# Patient Record
Sex: Male | Born: 2000 | Race: Black or African American | Hispanic: No | Marital: Single | Smoking: Current some day smoker
Health system: Southern US, Community
[De-identification: ages and names within clinical notes are randomized; demographics above are authoritative.]

## PROBLEM LIST (undated history)

## (undated) DIAGNOSIS — E119 Type 2 diabetes mellitus without complications: Secondary | ICD-10-CM

## (undated) DIAGNOSIS — F909 Attention-deficit hyperactivity disorder, unspecified type: Secondary | ICD-10-CM

## (undated) HISTORY — PX: TONSILLECTOMY: SUR1361

## (undated) HISTORY — DX: Type 2 diabetes mellitus without complications: E11.9

## (undated) HISTORY — PX: APPENDECTOMY: SHX54

---
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
## 2018-01-23 ENCOUNTER — Encounter (HOSPITAL_BASED_OUTPATIENT_CLINIC_OR_DEPARTMENT_OTHER): Payer: Self-pay | Admitting: *Deleted

## 2018-01-23 ENCOUNTER — Other Ambulatory Visit: Payer: Self-pay

## 2018-01-23 ENCOUNTER — Emergency Department (HOSPITAL_BASED_OUTPATIENT_CLINIC_OR_DEPARTMENT_OTHER): Payer: Medicaid Other

## 2018-01-23 ENCOUNTER — Inpatient Hospital Stay (HOSPITAL_BASED_OUTPATIENT_CLINIC_OR_DEPARTMENT_OTHER)
Admission: EM | Admit: 2018-01-23 | Discharge: 2018-01-25 | DRG: 638 | Disposition: A | Discharge status: Home / Self Care | Payer: Medicaid Other | Attending: Internal Medicine | Admitting: Internal Medicine

## 2018-01-23 DIAGNOSIS — J101 Influenza due to other identified influenza virus with other respiratory manifestations: Secondary | ICD-10-CM | POA: Diagnosis not present

## 2018-01-23 DIAGNOSIS — F1110 Opioid abuse, uncomplicated: Secondary | ICD-10-CM | POA: Diagnosis present

## 2018-01-23 DIAGNOSIS — R945 Abnormal results of liver function studies: Secondary | ICD-10-CM | POA: Diagnosis present

## 2018-01-23 DIAGNOSIS — E119 Type 2 diabetes mellitus without complications: Secondary | ICD-10-CM | POA: Diagnosis not present

## 2018-01-23 DIAGNOSIS — E1110 Type 2 diabetes mellitus with ketoacidosis without coma: Secondary | ICD-10-CM

## 2018-01-23 DIAGNOSIS — E860 Dehydration: Secondary | ICD-10-CM | POA: Diagnosis not present

## 2018-01-23 DIAGNOSIS — R079 Chest pain, unspecified: Secondary | ICD-10-CM | POA: Diagnosis present

## 2018-01-23 DIAGNOSIS — E1010 Type 1 diabetes mellitus with ketoacidosis without coma: Secondary | ICD-10-CM | POA: Diagnosis not present

## 2018-01-23 DIAGNOSIS — R0789 Other chest pain: Secondary | ICD-10-CM | POA: Diagnosis not present

## 2018-01-23 DIAGNOSIS — E861 Hypovolemia: Secondary | ICD-10-CM | POA: Diagnosis not present

## 2018-01-23 DIAGNOSIS — R05 Cough: Secondary | ICD-10-CM

## 2018-01-23 DIAGNOSIS — R109 Unspecified abdominal pain: Secondary | ICD-10-CM | POA: Diagnosis present

## 2018-01-23 DIAGNOSIS — N179 Acute kidney failure, unspecified: Secondary | ICD-10-CM | POA: Diagnosis not present

## 2018-01-23 DIAGNOSIS — R0781 Pleurodynia: Secondary | ICD-10-CM | POA: Diagnosis not present

## 2018-01-23 DIAGNOSIS — B349 Viral infection, unspecified: Secondary | ICD-10-CM

## 2018-01-23 DIAGNOSIS — R7989 Other specified abnormal findings of blood chemistry: Secondary | ICD-10-CM | POA: Diagnosis present

## 2018-01-23 DIAGNOSIS — T383X6A Underdosing of insulin and oral hypoglycemic [antidiabetic] drugs, initial encounter: Secondary | ICD-10-CM | POA: Diagnosis present

## 2018-01-23 DIAGNOSIS — E871 Hypo-osmolality and hyponatremia: Secondary | ICD-10-CM | POA: Diagnosis present

## 2018-01-23 LAB — I-STAT VENOUS BLOOD GAS, ED
Acid-base deficit: 11 mmol/L — ABNORMAL HIGH (ref 0.0–2.0)
Bicarbonate: 14.5 mmol/L — ABNORMAL LOW (ref 20.0–28.0)
O2 SAT: 88 %
Patient temperature: 98.4
TCO2: 15 mmol/L — ABNORMAL LOW (ref 22–32)
pCO2, Ven: 30 mmHg — ABNORMAL LOW (ref 44.0–60.0)
pH, Ven: 7.292 (ref 7.250–7.430)
pO2, Ven: 59 mmHg — ABNORMAL HIGH (ref 32.0–45.0)

## 2018-01-23 LAB — GLUCOSE, CAPILLARY
Glucose-Capillary: 263 mg/dL — ABNORMAL HIGH (ref 70–99)
Glucose-Capillary: 226 mg/dL — ABNORMAL HIGH (ref 70–99)
Glucose-Capillary: 197 mg/dL — ABNORMAL HIGH (ref 70–99)

## 2018-01-23 LAB — BASIC METABOLIC PANEL
Anion gap: 13 (ref 5–15)
BUN: 26 mg/dL — AB (ref 6–20)
CALCIUM: 8 mg/dL — AB (ref 8.9–10.3)
CO2: 15 mmol/L — ABNORMAL LOW (ref 22–32)
CREATININE: 1.27 mg/dL — AB (ref 0.61–1.24)
Chloride: 104 mmol/L (ref 98–111)
GFR calc Af Amer: >60 mL/min (ref >60)
GFR calc non Af Amer: >60 mL/min (ref >60)
Glucose, Bld: 236 mg/dL — ABNORMAL HIGH (ref 70–99)
Potassium: 4.2 mmol/L (ref 3.5–5.1)
Sodium: 132 mmol/L — ABNORMAL LOW (ref 135–145)

## 2018-01-23 LAB — CBG MONITORING, ED
GLUCOSE-CAPILLARY: 346 mg/dL — AB (ref 70–99)
Glucose-Capillary: 338 mg/dL — ABNORMAL HIGH (ref 70–99)
Glucose-Capillary: 321 mg/dL — ABNORMAL HIGH (ref 70–99)
Glucose-Capillary: 323 mg/dL — ABNORMAL HIGH (ref 70–99)

## 2018-01-23 LAB — COMPREHENSIVE METABOLIC PANEL
ALBUMIN: 4.1 g/dL (ref 3.5–5.0)
ALK PHOS: 72 U/L (ref 38–126)
ALT: 23 U/L (ref 0–44)
AST: 48 U/L — ABNORMAL HIGH (ref 15–41)
BILIRUBIN TOTAL: 2.2 mg/dL — AB (ref 0.3–1.2)
BUN: 23 mg/dL — ABNORMAL HIGH (ref 6–20)
CO2: 16 mmol/L — ABNORMAL LOW (ref 22–32)
Calcium: 8.9 mg/dL (ref 8.9–10.3)
Chloride: 92 mmol/L — ABNORMAL LOW (ref 98–111)
Creatinine, Ser: 1.41 mg/dL — ABNORMAL HIGH (ref 0.61–1.24)
GLUCOSE: 352 mg/dL — AB (ref 70–99)
POTASSIUM: 4.5 mmol/L (ref 3.5–5.1)
Sodium: 129 mmol/L — ABNORMAL LOW (ref 135–145)
TOTAL PROTEIN: 7.5 g/dL (ref 6.5–8.1)

## 2018-01-23 LAB — CBC
HEMATOCRIT: 47.2 % (ref 39.0–52.0)
Hemoglobin: 15.5 g/dL (ref 13.0–17.0)
MCH: 28 pg (ref 26.0–34.0)
MCHC: 32.8 g/dL (ref 30.0–36.0)
MCV: 85.4 fL (ref 80.0–100.0)
Platelets: 296 10*3/uL (ref 150–400)
RBC: 5.53 MIL/uL (ref 4.22–5.81)
RDW: 12 % (ref 11.5–15.5)
WBC: 7.6 10*3/uL (ref 4.0–10.5)
nRBC: 0 % (ref 0.0–0.2)

## 2018-01-23 LAB — URINALYSIS, ROUTINE W REFLEX MICROSCOPIC
Bilirubin Urine: NEGATIVE
Glucose, UA: >=500 mg/dL — AB
Hgb urine dipstick: NEGATIVE
Ketones, ur: >80 mg/dL — AB
Leukocytes, UA: NEGATIVE
Nitrite: NEGATIVE
Protein, ur: NEGATIVE mg/dL
Specific Gravity, Urine: 1.015 (ref 1.005–1.030)
pH: 6 (ref 5.0–8.0)

## 2018-01-23 LAB — INFLUENZA PANEL BY PCR (TYPE A & B)
Influenza A By PCR: POSITIVE — AB
Influenza B By PCR: NEGATIVE

## 2018-01-23 LAB — TROPONIN I: Troponin I: <0.03 ng/mL (ref <0.03)

## 2018-01-23 LAB — URINALYSIS, MICROSCOPIC (REFLEX)

## 2018-01-23 LAB — MRSA PCR SCREENING: MRSA by PCR: NEGATIVE

## 2018-01-23 LAB — LIPASE, BLOOD: Lipase: 16 U/L (ref 11–51)

## 2018-01-23 MED ORDER — ONDANSETRON HCL 4 MG/2ML IJ SOLN
4.0000 mg | Freq: Once | INTRAMUSCULAR | Status: AC
  Administered 2018-01-23: 4 mg via INTRAVENOUS
  Filled 2018-01-23: qty 2

## 2018-01-23 MED ORDER — NITROGLYCERIN 0.4 MG SL SUBL: 0.4000 mg | SUBLINGUAL_TABLET | SUBLINGUAL | Status: DC | PRN

## 2018-01-23 MED ORDER — DM-GUAIFENESIN ER 30-600 MG PO TB12: 1.0000 | ORAL_TABLET | Freq: Two times a day (BID) | ORAL | Status: DC | PRN

## 2018-01-23 MED ORDER — KETOROLAC TROMETHAMINE 15 MG/ML IJ SOLN
15.0000 mg | Freq: Once | INTRAMUSCULAR | Status: AC
  Administered 2018-01-23: 15 mg via INTRAVENOUS
  Filled 2018-01-23: qty 1

## 2018-01-23 MED ORDER — SODIUM CHLORIDE 0.9 % IV BOLUS
1000.0000 mL | Freq: Once | INTRAVENOUS | Status: AC
  Administered 2018-01-23: 1000 mL via INTRAVENOUS

## 2018-01-23 MED ORDER — ACETAMINOPHEN 325 MG PO TABS: 325.0000 mg | ORAL_TABLET | Freq: Four times a day (QID) | ORAL | Status: DC | PRN

## 2018-01-23 MED ORDER — INSULIN REGULAR(HUMAN) IN NACL 100-0.9 UT/100ML-% IV SOLN
INTRAVENOUS | Status: DC
  Administered 2018-01-23: 2.8 [IU]/h via INTRAVENOUS
  Filled 2018-01-23: qty 100

## 2018-01-23 MED ORDER — ONDANSETRON HCL 4 MG/2ML IJ SOLN
4.0000 mg | Freq: Once | INTRAMUSCULAR | Status: AC
  Administered 2018-01-23: 4 mg via INTRAVENOUS

## 2018-01-23 MED ORDER — SODIUM CHLORIDE 0.9 % IV BOLUS
2000.0000 mL | Freq: Once | INTRAVENOUS | Status: AC
  Administered 2018-01-23: 2000 mL via INTRAVENOUS

## 2018-01-23 MED ORDER — SODIUM CHLORIDE 0.9 % IV SOLN
INTRAVENOUS | Status: DC
  Administered 2018-01-23: 17:00:00 via INTRAVENOUS

## 2018-01-23 MED ORDER — ONDANSETRON HCL 4 MG/2ML IJ SOLN
INTRAMUSCULAR | Status: AC
  Filled 2018-01-23: qty 2

## 2018-01-23 MED ORDER — ZOLPIDEM TARTRATE 5 MG PO TABS: 5.0000 mg | ORAL_TABLET | Freq: Every evening | ORAL | Status: DC | PRN

## 2018-01-23 MED ORDER — INSULIN REGULAR(HUMAN) IN NACL 100-0.9 UT/100ML-% IV SOLN
INTRAVENOUS | Status: DC
  Filled 2018-01-23: qty 100

## 2018-01-23 MED ORDER — POTASSIUM CHLORIDE 10 MEQ/100ML IV SOLN
10.0000 meq | INTRAVENOUS | Status: AC
  Administered 2018-01-23 (×2): 10 meq via INTRAVENOUS
  Filled 2018-01-23 (×2): qty 100

## 2018-01-23 MED ORDER — ENOXAPARIN SODIUM 40 MG/0.4ML ~~LOC~~ SOLN
40.0000 mg | SUBCUTANEOUS | Status: DC
  Administered 2018-01-23 – 2018-01-24 (×2): 40 mg via SUBCUTANEOUS
  Filled 2018-01-23 (×2): qty 0.4

## 2018-01-23 MED ORDER — DEXTROSE-NACL 5-0.45 % IV SOLN
INTRAVENOUS | Status: DC
  Administered 2018-01-23: 22:00:00 via INTRAVENOUS

## 2018-01-23 MED ORDER — ALBUTEROL SULFATE (2.5 MG/3ML) 0.083% IN NEBU: 2.5000 mg | INHALATION_SOLUTION | RESPIRATORY_TRACT | Status: DC | PRN

## 2018-01-23 MED ORDER — SODIUM CHLORIDE 0.9 % IV SOLN
INTRAVENOUS | Status: AC
  Filled 2018-01-23: qty 1

## 2018-01-23 MED ORDER — SODIUM CHLORIDE 0.9 % IV SOLN
INTRAVENOUS | Status: DC
  Administered 2018-01-23: 21:00:00 via INTRAVENOUS

## 2018-01-23 MED ORDER — MORPHINE SULFATE (PF) 2 MG/ML IV SOLN
2.0000 mg | INTRAVENOUS | Status: DC | PRN
  Administered 2018-01-23: 2 mg via INTRAVENOUS
  Filled 2018-01-23: qty 1

## 2018-01-23 MED ORDER — ONDANSETRON HCL 4 MG/2ML IJ SOLN: 4.0000 mg | Freq: Three times a day (TID) | INTRAMUSCULAR | Status: DC | PRN

## 2018-01-23 NOTE — ED Notes (Signed)
Pt. Reports he has been sick with flu like symptoms for a week and now a very severe cough that has made his chest sore.  Pt. Reports feeling tired and weak.  Pt. In no distress.  Pt. Is diabetic.  Pt. Has insulin pens for his use.

## 2018-01-23 NOTE — Significant Event (Signed)
17 year old with past medical history relevant for type 1 diabetes who came in with subjective fevers, chills, cough to [facility].  Vitals were unremarkable.  Labs are notable for AKI, anion gap, acidosis consistent with very mild DKA.  Additionally patient was noted to have mildly elevated bilirubin and AST.  Patient was given IV fluids and started on IV insulin.  Patient will be admitted to stepdown bed for very mild DKA.  Blood cultures and chest x-ray pending.

## 2018-01-23 NOTE — ED Notes (Signed)
Pt unable to void at this time. 

## 2018-01-23 NOTE — ED Notes (Signed)
Pt. Said he did not take his INSULIN today due to he did not eat today due to nausea.  Pt. Logan SpanielSaid he has had some nausea and vomiting.

## 2018-01-23 NOTE — ED Provider Notes (Signed)
[facility] Emergency Department Provider Note MRN:  [id number]  Arrival date & time: 01/23/18     Chief Complaint   Fever History of Present Illness   Logan Washington is a 17 y.o. year-old male with a history of type 1 diabetes presenting to the ED with chief complaint of fever.  For 1 to 2 days, patient has been experiencing dry cough, general malaise, runny nose, mild frontal headache.  Last night he was unable to sleep because he was feeling feverish and overheated.  Feels dehydrated today.  Denies neck pain, no shortness of breath, no chest pain.  Endorsing mild diffuse abdominal cramping pain.  Denies dysuria, no numbness weakness to the arms or legs.  Symptoms are constant, no exacerbating or alleviating factors.  Review of Systems  A complete 10 system review of systems was obtained and all systems are negative except as noted in the HPI and PMH.   Patient's Health History    Past Medical History:  Diagnosis Date  . Diabetes mellitus without complication (HCC)     History reviewed. No pertinent surgical history.  Family History  Problem Relation Age of Onset  . Sudden death Neg Hx   . Heart attack Neg Hx     Social History   Socioeconomic History  . Marital status: Single    Spouse name: Not on file  . Number of children: Not on file  . Years of education: Not on file  . Highest education level: Not on file  Occupational History  . Not on file  Social Needs  . Financial resource strain: Not on file  . Food insecurity:    Worry: Not on file    Inability: Not on file  . Transportation needs:    Medical: Not on file    Non-medical: Not on file  Tobacco Use  . Smoking status: Never Smoker  . Smokeless tobacco: Never Used  Substance and Sexual Activity  . Alcohol use: Not on file  . Drug use: Not on file  . Sexual activity: Not on file  Lifestyle  . Physical activity:    Days per week: Not on file    Minutes per session: Not on  file  . Stress: Not on file  Relationships  . Social connections:    Talks on phone: Not on file    Gets together: Not on file    Attends religious service: Not on file    Active member of club or organization: Not on file    Attends meetings of clubs or organizations: Not on file    Relationship status: Not on file  . Intimate partner violence:    Fear of current or ex partner: Not on file    Emotionally abused: Not on file    Physically abused: Not on file    Forced sexual activity: Not on file  Other Topics Concern  . Not on file  Social History Narrative  . Not on file     Physical Exam  Vital Signs and Nursing Notes reviewed Vitals:   01/23/18 1502  BP: 139/70  Pulse: 92  Resp: 16  Temp: 98.3 F (36.8 C)  SpO2: 100%    CONSTITUTIONAL: Well-appearing, NAD NEURO:  Alert and oriented x 3, no focal deficits EYES:  eyes equal and reactive ENT/NECK:  no LAD, no JVD CARDIO: Regular rate, well-perfused, normal S1 and S2 PULM:  CTAB no wheezing or rhonchi GI/GU:  normal bowel sounds, non-distended, non-tender MSK/SPINE:  No  gross deformities, no edema SKIN:  no rash, atraumatic PSYCH:  Appropriate speech and behavior  Diagnostic and Interventional Summary    EKG Interpretation  Date/Time:    Ventricular Rate:    PR Interval:    QRS Duration:   QT Interval:    QTC Calculation:   R Axis:     Text Interpretation:        Labs Reviewed  COMPREHENSIVE METABOLIC PANEL - Abnormal; Notable for the following components:      Result Value   Sodium 129 (*)    Chloride 92 (*)    CO2 16 (*)    Glucose, Bld 352 (*)    BUN 23 (*)    Creatinine, Ser 1.41 (*)    AST 48 (*)    Total Bilirubin 2.2 (*)    Anion gap >20 (*)    All other components within normal limits  CBG MONITORING, ED - Abnormal; Notable for the following components:   Glucose-Capillary 346 (*)    All other components within normal limits  CBG MONITORING, ED - Abnormal; Notable for the following  components:   Glucose-Capillary 338 (*)    All other components within normal limits  I-STAT VENOUS BLOOD GAS, ED - Abnormal; Notable for the following components:   pCO2, Ven 30.0 (*)    pO2, Ven 59.0 (*)    Bicarbonate 14.5 (*)    TCO2 15 (*)    Acid-base deficit 11.0 (*)    All other components within normal limits  CULTURE, BLOOD (ROUTINE X 2)  CULTURE, BLOOD (ROUTINE X 2)  CBC  URINALYSIS, ROUTINE W REFLEX MICROSCOPIC  BLOOD GAS, VENOUS  CBG MONITORING, ED    DG Chest 2 View    (Results Pending)    Medications  insulin regular, human (MYXREDLIN) 100 units/ 100 mL infusion (2.8 Units/hr Intravenous New Bag/Given 01/23/18 1708)  0.9 %  sodium chloride infusion ( Intravenous New Bag/Given 01/23/18 1652)  potassium chloride 10 mEq in 100 mL IVPB (10 mEq Intravenous New Bag/Given 01/23/18 1716)  sodium chloride 0.9 % with insulin regular (NOVOLIN R,HUMULIN R) ADS Med (has no administration in time range)  sodium chloride 0.9 % bolus 1,000 mL (0 mLs Intravenous Stopped 01/23/18 1638)  ketorolac (TORADOL) 15 MG/ML injection 15 mg (15 mg Intravenous Given 01/23/18 1539)  ondansetron (ZOFRAN) injection 4 mg (4 mg Intravenous Given 01/23/18 1545)     Procedures Critical Care Critical Care Documentation Critical care time provided by me (excluding procedures): 37 minutes  Condition necessitating critical care: Diabetic ketoacidosis  Components of critical care management: reviewing of prior records, laboratory and imaging interpretation, frequent re-examination and reassessment of vital signs, administration of IV fluid resuscitation, IV insulin, IV potassium, discussion with consulting services.    ED Course and Medical Decision Making  I have reviewed the triage vital signs and the nursing notes.  Pertinent labs & imaging results that were available during my care of the patient were reviewed by me and considered in my medical decision making (see below for  details).  Favoring viral illness in this 17 year old male, multiple sick contacts at home.  Abdomen is nontender and reassuring.  Given his type 1 diabetes history, will obtain labs to exclude possibility of DKA.  Labs reveal base deficit with anion gap metabolic acidosis consistent with DKA.  Provided with fluid bolus, normal saline infusion, potassium supplementation, IV insulin drip, accepted for admission by the hospitalist service.  Elmer Sow. Pilar Plate, MD [facility] Emergency Medicine [facility]  Health [email or web address]  Final Clinical Impressions(s) / ED Diagnoses     ICD-10-CM   1. Viral illness B34.9   2. DKA (diabetic ketoacidoses) (HCC) E11.10 DG Chest 2 View    DG Chest 2 View    ED Discharge Orders    None         Sabas Sous, MD 01/23/18 [phone]

## 2018-01-23 NOTE — H&P (Signed)
History and Physical    Logan Washington ZOX:[id number] DOB: 27-Mar-2000 DOA: 01/23/2018  Referring MD/NP/PA:   PCP: Patient, No Pcp Per   Patient coming from:  The patient is coming from home.  At baseline, pt is independent for most of ADL.        Chief Complaint: Cough, runny nose, sore throat, subjective fever, chills, abdominal pain, chest pain  HPI: Logan Washington is a 17 y.o. male with medical history significant of type 1 diabetes, who presents with cough, runny nose, sore throat, subjective fever, chills, abdominal pain, chest pain.  Patient states that he has been sick for more than 1 week.  He has cough, runny nose, sore throat, subjective fever and chills. He is taking over-the-counter medications without significant help.  She denies shortness of breath.  Today he developed chest pain, which is located in the front chest, intermittent, 7 out of 10 in severity, nonradiating, dull, not pruritic.  Denies tenderness and cough virus.  She has nausea, no vomiting or diarrhea.  He also has abdominal pain in the past 2 days, which is located in central abdomen, sharp, 9 out of 10 in severity, nonradiating.  Denies symptoms of UTI or unilateral weakness. He states that he did not take his insulin in the past 2 days because of feeling too tired.  ED Course: pt was found to have DKA (blood sugar 352, bicarbonate of 16, positive ketone in urine, anion gap 20), WBC 7.6, positive flu A pcr, negative Rapid strep, urinalysis negative for UTI, abnormal liver function (ALP 72, AST 48, ALT 23, total bilirubin 2.2), temperature 99, heart rate in 90s, no tachypnea, oxygen saturation 100% on room air, negative chest x-ray.  Patient is accepted to [facility] as inpt by accepting MD.  Review of Systems:   General: has fevers, chills, runny nose, sore throat, no body weight gain, has poor appetite, has fatigue HEENT: no blurry vision, hearing changes or sore throat Respiratory: no dyspnea, has coughing, no  wheezing CV: has chest pain, no palpitations GI: has nausea,  abdominal pain, no diarrhea, constipation, vomiting, GU: no dysuria, burning on urination, increased urinary frequency, hematuria  Ext: no leg edema Neuro: no unilateral weakness, numbness, or tingling, no vision change or hearing loss Skin: no rash, no skin tear. MSK: No muscle spasm, no deformity, no limitation of range of movement in spin Heme: No easy bruising.  Travel history: No recent long distant travel.  Allergy:  Allergies  Allergen Reactions  . Shellfish Allergy Swelling    Past Medical History:  Diagnosis Date  . Diabetes mellitus without complication (HCC)     History reviewed. No pertinent surgical history.  Social History:  reports that he has never smoked. He has never used smokeless tobacco. No history on file for alcohol and drug.  Family History:  Family History  Problem Relation Age of Onset  . Sudden death Neg Hx   . Heart attack Neg Hx      Prior to Admission medications   Medication Sig Start Date End Date Taking? Authorizing Provider  B-D UF III MINI PEN NEEDLES 31G X 5 MM MISC FOR USE WITH INSULIN PENS 10/12/15  Yes [provider]  glucagon, human recombinant, (GLUCAGEN DIAGNOSTIC) 1 MG injection Inject 1 mL into the muscle once as needed for low blood sugar. 08/30/15  Yes [provider]  LANTUS SOLOSTAR 100 UNIT/ML Solostar Pen Inject 31 Units into the skin at bedtime.  10/09/15  Yes [provider]  Rubbie Battiest  FLEXPEN 100 UNIT/ML FlexPen INJECT UNDER SKIN WITH MEALS. USE UP TO 50 UNITS DAILY. 10/09/15  Yes [provider]    Physical Exam: Vitals:   01/24/18 0200 01/24/18 0300 01/24/18 0356 01/24/18 0400  BP: 128/67 126/65  126/75  Pulse: (!) 101 93  85  Resp: 16 16  (!) 9  Temp:   98.9 F (37.2 C)   TempSrc:   Oral   SpO2: 96% 97%  98%  Weight:      Height:       General: Not in acute distress.  Dry mucosal membrane HEENT:       Eyes: PERRL,  EOMI, no scleral icterus.       ENT: No discharge from the ears and nose, has pharynx injection, no tonsillar enlargement.        Neck: No JVD, no bruit, no mass felt. Heme: No neck lymph node enlargement. Cardiac: S1/S2, RRR, No murmurs, No gallops or rubs. Respiratory: No rales, wheezing, rhonchi or rubs. GI: Soft, nondistended, has tenderness in cental abdomen, no rebound pain, no organomegaly, BS present. GU: No hematuria Ext: No pitting leg edema bilaterally. 2+DP/PT pulse bilaterally. Musculoskeletal: No joint deformities, No joint redness or warmth, no limitation of ROM in spin. Skin: No rashes.  Neuro: Alert, oriented X3, cranial nerves II-XII grossly intact, moves all extremities normally.  Psych: Patient is not psychotic, no suicidal or hemocidal ideation.  Labs on Admission: I have personally reviewed following labs and imaging studies  CBC: Recent Labs  Lab 01/23/18 1519  WBC 7.6  HGB 15.5  HCT 47.2  MCV 85.4  PLT 296   Basic Metabolic Panel: Recent Labs  Lab 01/23/18 1519 01/23/18 2142 01/24/18 0210  NA 129* 132* 132*  K 4.5 4.2 4.0  CL 92* 104 106  CO2 16* 15* 18*  GLUCOSE 352* 236* 157*  BUN 23* 26* 22*  CREATININE 1.41* 1.27* 1.04  CALCIUM 8.9 8.0* 7.8*   GFR: Estimated Creatinine Clearance: 109.9 mL/min (by C-G formula based on SCr of 1.04 mg/dL). Liver Function Tests: Recent Labs  Lab 01/23/18 1519  AST 48*  ALT 23  ALKPHOS 72  BILITOT 2.2*  PROT 7.5  ALBUMIN 4.1   Recent Labs  Lab 01/23/18 2242  LIPASE 16   No results for input(s): AMMONIA in the last 168 hours. Coagulation Profile: No results for input(s): INR, PROTIME in the last 168 hours. Cardiac Enzymes: Recent Labs  Lab 01/23/18 2242  TROPONINI <0.03   BNP (last 3 results) No results for input(s): PROBNP in the last 8760 hours. HbA1C: No results for input(s): HGBA1C in the last 72 hours. CBG: Recent Labs  Lab 01/24/18 0003 01/24/18 0111 01/24/18 0212 01/24/18 0324  01/24/18 0510  GLUCAP 167* 159* 140* 120* 86   Lipid Profile: No results for input(s): CHOL, HDL, LDLCALC, TRIG, CHOLHDL, LDLDIRECT in the last 72 hours. Thyroid Function Tests: No results for input(s): TSH, T4TOTAL, FREET4, T3FREE, THYROIDAB in the last 72 hours. Anemia Panel: No results for input(s): VITAMINB12, FOLATE, FERRITIN, TIBC, IRON, RETICCTPCT in the last 72 hours. Urine analysis:    Component Value Date/Time   COLORURINE YELLOW 01/23/2018 1519   APPEARANCEUR CLEAR 01/23/2018 1519   LABSPEC 1.015 01/23/2018 1519   PHURINE 6.0 01/23/2018 1519   GLUCOSEU >=500 (A) 01/23/2018 1519   HGBUR NEGATIVE 01/23/2018 1519   BILIRUBINUR NEGATIVE 01/23/2018 1519   KETONESUR >80 (A) 01/23/2018 1519   PROTEINUR NEGATIVE 01/23/2018 1519   NITRITE NEGATIVE 01/23/2018 1519   LEUKOCYTESUR  NEGATIVE 01/23/2018 1519   Sepsis Labs: @LABRCNTIP (procalcitonin:4,lacticidven:4) ) Recent Results (from the past 240 hour(s))  MRSA PCR Screening     Status: None   Collection Time: 01/23/18  8:23 PM  Result Value Ref Range Status   MRSA by PCR NEGATIVE NEGATIVE Final    Comment:        The GeneXpert MRSA Assay (FDA approved for NASAL specimens only), is one component of a comprehensive MRSA colonization surveillance program. It is not intended to diagnose MRSA infection nor to guide or monitor treatment for MRSA infections. Performed at [facility], [address], [place], [place] [address]   Group A Strep by PCR     Status: None   Collection Time: 01/24/18  1:53 AM  Result Value Ref Range Status   Group A Strep by PCR NOT DETECTED NOT DETECTED Final    Comment: Performed at [facility], [address] [place], [place] [address]     Radiological Exams on Admission: Dg Chest 2 View  Result Date: 01/23/2018 CLINICAL DATA:  DKA, fever EXAM: CHEST - 2 VIEW COMPARISON:  None. FINDINGS: The heart size and mediastinal contours are within normal  limits. Both lungs are clear. The visualized skeletal structures are unremarkable. IMPRESSION: No active cardiopulmonary disease. Electronically Signed   By: Jasmine Pang M.D.   On: 01/23/2018 18:17     EKG:  Not done in ED, will get one.   Assessment/Plan Principal Problem:   DKA, type 1 (HCC) Active Problems:   Diabetes mellitus without complication (HCC)   AKI (acute kidney injury) (HCC)   Abnormal LFTs   Chest pain   Influenza A   Abdominal pain   DKA, type 1 (HCC): blood sugar 352, bicarbonate of 16, positive ketone in urine, anion gap 20). This is due to noncompliance to taking insulin.  - Admit to stepdown (pt was initially accepted to SDU as inpt by accepting MD) - 3L of NS bolus - start DKA protocol with BMP q4h - IVF: NS 125 cc/h; will switch to D5-1/2NS when CBG<250 - replete K as needed - Zofran prn nausea  - NPO  - consult to diabetic educator and case manager  Diabetes mellitus without complication (HCC): Last A1c not on record. Patient is taking novolog and lantus at home -on DKA protocol -check A1c  AKI: Likely due to dehydration and continuation - IVF as above - Follow up renal function by BMP - Avoid using renal toxic medications  Influenza A:  -Tamiflu started  Chest pain: Etiology is not clear, possibly due to demand ischemia secondary to flu and a DKA. - cycle CE q6 x3 and repeat EKG in the am  - prn Nitroglycerin, Morphine, and aspirin - Risk factor stratification: will check FLP and A1C, UDS  Abdominal pain: Etiology is not clear, may be partially related to DKA.  Patient has nausea, no vomiting or diarrhea.  No acute abdomen on physical examination. -Check lipase -prn morphine for pain and Zofran for nausea  Abnormal LFTs:  -check hepatitis panel, HIV antibody - Judicious use of Tylenol (325 mg every 6 hours).  Patient has AKI, cannot use NSAIDs   Inpatient status:  # Patient requires inpatient status due to high intensity of service,  high risk for further deterioration and high frequency of surveillance required.  I certify that at the point of admission it is my clinical judgment that the patient will require inpatient hospital care spanning beyond 2 midnights from the point  of admission.  . This patient has hx of DM-type I  . Now patient has presenting symptoms include DKA, acute renal injury, chest pain, abdominal pain, flu symptoms . The worrisome physical exam findings include abdominal tenderness, . The initial radiographic and laboratory data are worrisome because of acute renal injury, positive flu a PCR, abnormal liver function . Current medical needs: please see my assessment and plan . Predictability of an adverse outcome (risk): Patient has a type 1 diabetes, which is likely poorly controlled. Now presents with DKA, chest pain, abdominal pain, flu A influenza.  Due to multiple acute issues, patient is at high risk of deteriorating, will need to stay in hospital for more than 2 days.    DVT ppx: SQ Lovenox Code Status: Full code Family Communication: None at bed side.    Disposition Plan:  Anticipate discharge back to previous home environment Consults called:  none Admission status:   SDU/inpation       Date of Service 01/24/2018    Lorretta HarpXilin Sewell Pitner [facility] Pager [phone]  If 7PM-7AM, please contact night-coverage [email or web address] Password [email or web address] 01/24/2018, 5:31 AM

## 2018-01-23 NOTE — ED Triage Notes (Signed)
Cough, runny nose. He has been using OTC cold medication.

## 2018-01-24 DIAGNOSIS — R0781 Pleurodynia: Secondary | ICD-10-CM

## 2018-01-24 DIAGNOSIS — J101 Influenza due to other identified influenza virus with other respiratory manifestations: Secondary | ICD-10-CM | POA: Diagnosis present

## 2018-01-24 DIAGNOSIS — E1010 Type 1 diabetes mellitus with ketoacidosis without coma: Principal | ICD-10-CM

## 2018-01-24 DIAGNOSIS — R109 Unspecified abdominal pain: Secondary | ICD-10-CM | POA: Diagnosis present

## 2018-01-24 DIAGNOSIS — N179 Acute kidney failure, unspecified: Secondary | ICD-10-CM

## 2018-01-24 LAB — BASIC METABOLIC PANEL
Anion gap: 8 (ref 5–15)
Anion gap: 10 (ref 5–15)
BUN: 22 mg/dL — ABNORMAL HIGH (ref 6–20)
BUN: 19 mg/dL (ref 6–20)
CO2: 18 mmol/L — ABNORMAL LOW (ref 22–32)
CO2: 19 mmol/L — AB (ref 22–32)
CREATININE: 1.04 mg/dL (ref 0.61–1.24)
Calcium: 7.8 mg/dL — ABNORMAL LOW (ref 8.9–10.3)
Calcium: 8.2 mg/dL — ABNORMAL LOW (ref 8.9–10.3)
Chloride: 106 mmol/L (ref 98–111)
Chloride: 106 mmol/L (ref 98–111)
Creatinine, Ser: 1.08 mg/dL (ref 0.61–1.24)
GFR calc Af Amer: >60 mL/min (ref >60)
GFR calc Af Amer: >60 mL/min (ref >60)
GFR calc non Af Amer: >60 mL/min (ref >60)
GFR calc non Af Amer: >60 mL/min (ref >60)
Glucose, Bld: 157 mg/dL — ABNORMAL HIGH (ref 70–99)
Glucose, Bld: 90 mg/dL (ref 70–99)
Potassium: 4 mmol/L (ref 3.5–5.1)
Potassium: 4.1 mmol/L (ref 3.5–5.1)
Sodium: 132 mmol/L — ABNORMAL LOW (ref 135–145)
Sodium: 135 mmol/L (ref 135–145)

## 2018-01-24 LAB — GLUCOSE, CAPILLARY
Glucose-Capillary: 120 mg/dL — ABNORMAL HIGH (ref 70–99)
Glucose-Capillary: 139 mg/dL — ABNORMAL HIGH (ref 70–99)
Glucose-Capillary: 140 mg/dL — ABNORMAL HIGH (ref 70–99)
Glucose-Capillary: 159 mg/dL — ABNORMAL HIGH (ref 70–99)
Glucose-Capillary: 167 mg/dL — ABNORMAL HIGH (ref 70–99)
Glucose-Capillary: 207 mg/dL — ABNORMAL HIGH (ref 70–99)
Glucose-Capillary: 216 mg/dL — ABNORMAL HIGH (ref 70–99)
Glucose-Capillary: 78 mg/dL (ref 70–99)
Glucose-Capillary: 86 mg/dL (ref 70–99)
Glucose-Capillary: 90 mg/dL (ref 70–99)
Glucose-Capillary: 98 mg/dL (ref 70–99)

## 2018-01-24 LAB — COMPREHENSIVE METABOLIC PANEL
ALT: 20 U/L (ref 0–44)
AST: 27 U/L (ref 15–41)
Albumin: 3.7 g/dL (ref 3.5–5.0)
Alkaline Phosphatase: 58 U/L (ref 38–126)
Anion gap: 15 (ref 5–15)
BUN: 16 mg/dL (ref 6–20)
CO2: 18 mmol/L — ABNORMAL LOW (ref 22–32)
Calcium: 8.6 mg/dL — ABNORMAL LOW (ref 8.9–10.3)
Chloride: 101 mmol/L (ref 98–111)
Creatinine, Ser: 1.02 mg/dL (ref 0.61–1.24)
GFR calc non Af Amer: >60 mL/min (ref >60)
Glucose, Bld: 169 mg/dL — ABNORMAL HIGH (ref 70–99)
Potassium: 4.3 mmol/L (ref 3.5–5.1)
SODIUM: 134 mmol/L — AB (ref 135–145)
Total Bilirubin: 1.5 mg/dL — ABNORMAL HIGH (ref 0.3–1.2)
Total Protein: 6.8 g/dL (ref 6.5–8.1)

## 2018-01-24 LAB — RAPID URINE DRUG SCREEN, HOSP PERFORMED
Amphetamines: NOT DETECTED
Barbiturates: NOT DETECTED
Benzodiazepines: NOT DETECTED
Cocaine: NOT DETECTED
Opiates: POSITIVE — AB
Tetrahydrocannabinol: POSITIVE — AB

## 2018-01-24 LAB — HEMOGLOBIN A1C
Hgb A1c MFr Bld: 8.9 % — ABNORMAL HIGH (ref 4.8–5.6)
MEAN PLASMA GLUCOSE: 208.73 mg/dL

## 2018-01-24 LAB — LIPID PANEL
Cholesterol: 94 mg/dL (ref 0–200)
HDL: 28 mg/dL — ABNORMAL LOW (ref >40)
LDL Cholesterol: 55 mg/dL (ref 0–99)
TRIGLYCERIDES: 57 mg/dL (ref <150)
Total CHOL/HDL Ratio: 3.4 RATIO
VLDL: 11 mg/dL (ref 0–40)

## 2018-01-24 LAB — TROPONIN I
Troponin I: <0.03 ng/mL (ref <0.03)
Troponin I: <0.03 ng/mL (ref <0.03)

## 2018-01-24 LAB — GROUP A STREP BY PCR: Group A Strep by PCR: NOT DETECTED

## 2018-01-24 LAB — HIV ANTIBODY (ROUTINE TESTING W REFLEX): HIV Screen 4th Generation wRfx: NONREACTIVE

## 2018-01-24 MED ORDER — PHENOL 1.4 % MT LIQD
1.0000 | OROMUCOSAL | Status: DC | PRN
  Administered 2018-01-24: 1 via OROMUCOSAL
  Filled 2018-01-24: qty 177

## 2018-01-24 MED ORDER — INSULIN GLARGINE 100 UNIT/ML ~~LOC~~ SOLN
15.0000 [IU] | Freq: Every day | SUBCUTANEOUS | Status: DC
  Administered 2018-01-24 – 2018-01-25 (×2): 15 [IU] via SUBCUTANEOUS
  Filled 2018-01-24 (×2): qty 0.15

## 2018-01-24 MED ORDER — ASPIRIN 325 MG PO TABS
325.0000 mg | ORAL_TABLET | Freq: Every day | ORAL | Status: DC
  Administered 2018-01-24: 325 mg via ORAL
  Filled 2018-01-24: qty 1

## 2018-01-24 MED ORDER — BENZONATATE 100 MG PO CAPS
200.0000 mg | ORAL_CAPSULE | Freq: Three times a day (TID) | ORAL | Status: DC | PRN
  Administered 2018-01-24 – 2018-01-25 (×2): 200 mg via ORAL
  Filled 2018-01-24 (×2): qty 2

## 2018-01-24 MED ORDER — INSULIN ASPART 100 UNIT/ML ~~LOC~~ SOLN: 0.0000 [IU] | SUBCUTANEOUS | Status: DC

## 2018-01-24 MED ORDER — INSULIN ASPART 100 UNIT/ML ~~LOC~~ SOLN
0.0000 [IU] | Freq: Every day | SUBCUTANEOUS | Status: DC
  Administered 2018-01-24: 2 [IU] via SUBCUTANEOUS

## 2018-01-24 MED ORDER — OSELTAMIVIR PHOSPHATE 75 MG PO CAPS
75.0000 mg | ORAL_CAPSULE | Freq: Two times a day (BID) | ORAL | Status: DC
  Administered 2018-01-24 – 2018-01-25 (×4): 75 mg via ORAL
  Filled 2018-01-24 (×4): qty 1

## 2018-01-24 MED ORDER — GUAIFENESIN-DM 100-10 MG/5ML PO SYRP
5.0000 mL | ORAL_SOLUTION | ORAL | Status: DC | PRN
  Administered 2018-01-24: 5 mL via ORAL
  Filled 2018-01-24: qty 10

## 2018-01-24 MED ORDER — INSULIN ASPART 100 UNIT/ML ~~LOC~~ SOLN
0.0000 [IU] | Freq: Three times a day (TID) | SUBCUTANEOUS | Status: DC
  Administered 2018-01-24: 2 [IU] via SUBCUTANEOUS
  Administered 2018-01-24: 5 [IU] via SUBCUTANEOUS
  Administered 2018-01-25: 8 [IU] via SUBCUTANEOUS

## 2018-01-24 NOTE — Progress Notes (Addendum)
[facility] PROGRESS NOTE  Jerrik Housholder NWG:[id number] DOB: Dec 26, 2000 DOA: 01/23/2018  PCP: Patient, No Pcp Per  Brief History/Interval Summary: 17 y.o. male with medical history significant of type 1 diabetes, who presented with cough, runny nose, sore throat, subjective fever, chills, abdominal pain, chest pain.  Patient was found to be in diabetic ketoacidosis.  He also tested positive for influenza A.  Patient was hospitalized for further management.  He mentioned to the admitting provider that he had not taken his insulin in 2 days but he told me that he was taking his insulin.  Reason for Visit: Diabetic ketoacidosis.  Influenza.  Consultants: None  Procedures: None  Antibiotics: Tamiflu  Subjective/Interval History: Patient continues to feel poorly.  Continues to have cough with discomfort in his chest while coughing.  Some shortness of breath.  Denies any nausea or vomiting.  No diarrhea.  Still feels fatigued.  ROS: Denies any headaches  Objective:  Vital Signs  Vitals:   01/24/18 0356 01/24/18 0400 01/24/18 0500 01/24/18 0735  BP:  126/75 125/65   Pulse:  85 88   Resp:  (!) 9 18   Temp: 98.9 F (37.2 C)   98.4 F (36.9 C)  TempSrc: Oral   Oral  SpO2:  98% 97%   Weight:      Height:        Intake/Output Summary (Last 24 hours) at 01/24/2018 1039 Last data filed at 01/24/2018 0510 Gross per 24 hour  Intake 1097.65 ml  Output 2450 ml  Net -1352.35 ml   Filed Weights   01/23/18 1456  Weight: 68 kg    General appearance: alert, cooperative, appears stated age and no distress Head: Normocephalic, without obvious abnormality, atraumatic Resp: Few scattered wheezes.  No rhonchi.  Normal effort at rest. Cardio: regular rate and rhythm, S1, S2 normal, no murmur, click, rub or gallop GI: soft, non-tender; bowel sounds normal; no masses,  no organomegaly Extremities: extremities normal, atraumatic, no cyanosis or edema Pulses: 2+ and  symmetric Neurologic: No focal neurological deficits noted.  Lab Results:  Data Reviewed: I have personally reviewed following labs and imaging studies  CBC: Recent Labs  Lab 01/23/18 1519  WBC 7.6  HGB 15.5  HCT 47.2  MCV 85.4  PLT 296    Basic Metabolic Panel: Recent Labs  Lab 01/23/18 1519 01/23/18 2142 01/24/18 0210  NA 129* 132* 132*  K 4.5 4.2 4.0  CL 92* 104 106  CO2 16* 15* 18*  GLUCOSE 352* 236* 157*  BUN 23* 26* 22*  CREATININE 1.41* 1.27* 1.04  CALCIUM 8.9 8.0* 7.8*    GFR: Estimated Creatinine Clearance: 109.9 mL/min (by C-G formula based on SCr of 1.04 mg/dL).  Liver Function Tests: Recent Labs  Lab 01/23/18 1519  AST 48*  ALT 23  ALKPHOS 72  BILITOT 2.2*  PROT 7.5  ALBUMIN 4.1    Recent Labs  Lab 01/23/18 2242  LIPASE 16    Cardiac Enzymes: Recent Labs  Lab 01/23/18 2242 01/24/18 0527  TROPONINI <0.03 <0.03    HbA1C: Recent Labs    01/24/18 0527  HGBA1C 8.9*    CBG: Recent Labs  Lab 01/24/18 0324 01/24/18 0510 01/24/18 0627 01/24/18 0645 01/24/18 0759  GLUCAP 120* 86 90 78 98    Lipid Profile: Recent Labs    01/24/18 0527  CHOL 94  HDL 28*  LDLCALC 55  TRIG 57  CHOLHDL 3.4     Recent Results (from the past 240 hour(s))  MRSA  PCR Screening     Status: None   Collection Time: 01/23/18  8:23 PM  Result Value Ref Range Status   MRSA by PCR NEGATIVE NEGATIVE Final    Comment:        The GeneXpert MRSA Assay (FDA approved for NASAL specimens only), is one component of a comprehensive MRSA colonization surveillance program. It is not intended to diagnose MRSA infection nor to guide or monitor treatment for MRSA infections. Performed at [facility], [address] [place], [place] [address]   Group A Strep by PCR     Status: None   Collection Time: 01/24/18  1:53 AM  Result Value Ref Range Status   Group A Strep by PCR NOT DETECTED NOT DETECTED Final    Comment: Performed at  [facility], [address] [place], [place] [address]      Radiology Studies: Dg Chest 2 View  Result Date: 01/23/2018 CLINICAL DATA:  DKA, fever EXAM: CHEST - 2 VIEW COMPARISON:  None. FINDINGS: The heart size and mediastinal contours are within normal limits. Both lungs are clear. The visualized skeletal structures are unremarkable. IMPRESSION: No active cardiopulmonary disease. Electronically Signed   By: Jasmine PangKim  Fujinaga M.D.   On: 01/23/2018 18:17     Medications:  Scheduled: . aspirin  325 mg Oral Daily  . enoxaparin (LOVENOX) injection  40 mg Subcutaneous Q24H  . insulin aspart  0-15 Units Subcutaneous TID WC  . insulin aspart  0-5 Units Subcutaneous QHS  . insulin glargine  15 Units Subcutaneous Daily  . oseltamivir  75 mg Oral BID   Continuous:  BMW:[id number]:acetaminophen, albuterol, dextromethorphan-guaiFENesin, morphine injection, nitroGLYCERIN, ondansetron (ZOFRAN) IV, zolpidem    Assessment/Plan:  Diabetic ketoacidosis in the setting of type 1 diabetes Patient had elevated anion gap.  DKA was likely brought on by influenza.  Patient may not have taken his Lantus insulin the last 2 days prior to admission.  That could have also contributed.  Patient was initially placed on IV insulin.  Anion gap closed.  He was transitioned to Lantus.  Continue to monitor CBGs.  HbA1c is 8.9.  Acute influenza Still feels poorly.  Coughing a lot.  Some shortness of breath.  Continue with Tamiflu.  Cough medications as needed.  Droplet precautions.  Acute kidney injury/hyponatremia Most likely due to dehydration and hypovolemia.  Improved with IV hydration.  Back to baseline now.  Monitor urine output.  Sodium levels have improved.  Continue to monitor.  Chest pain and abdominal pain The symptoms are likely due to influenza as well as DKA.  EKG nonischemic.  Chest pain is primarily with coughing.  Do not anticipate any further work-up.  Patient reassured.  Lipase was normal.   LDL 55.  Discontinue aspirin.  Mildly abnormal LFTs His AST was noted to be mildly elevated.  We will recheck.  Abnormal urine drug screen Urine drug screen positive for opioids as well as THC.  DVT Prophylaxis: Lovenox    Code Status: Full code Family Communication: Discussed with the patient.  No other family member at bedside Disposition Plan: Management as outlined above.  Await further improvement in symptoms.  Okay for transfer to floor later today.    LOS: 1 day   Osvaldo ShipperGokul Shlok Raz  [facility] Pager [phone] 01/24/2018, 10:39 AM  If 7PM-7AM, please contact night-coverage at [email or web address], password [email or web address]

## 2018-01-24 NOTE — Progress Notes (Signed)
Patient arrived on unit via wheelchair from ICU. No family at bedside.  

## 2018-01-24 NOTE — Progress Notes (Signed)
Inpatient Diabetes Program Recommendations  AACE/ADA: New Consensus Statement on Inpatient Glycemic Control (2015)  Target Ranges:  Prepandial:   less than 140 mg/dL      Peak postprandial:   less than 180 mg/dL (1-2 hours)      Critically ill patients:  140 - 180 mg/dL   Lab Results  Component Value Date   GLUCAP 98 01/24/2018   HGBA1C 8.9 (H) 01/24/2018    Review of Glycemic Control Results for Logan Washington, Logan Washington (MRN [id number]) as of 01/24/2018 09:37  Ref. Range 01/24/2018 05:10 01/24/2018 06:27 01/24/2018 06:45 01/24/2018 07:59  Glucose-Capillary Latest Ref Range: 70 - 99 mg/dL 86 90 78 98   Diabetes history: Type 1 diabetes since age 89 Outpatient Diabetes medications: Lantus 31 units q HS,  Novolog 1 units for every 50 mg/dL> 846150 mg/dL, 1 unit for every 7 grams of CHO   Current orders for Inpatient glycemic control:  Lantus 15 units daily, Novolog sensitive q 4 hours Inpatient Diabetes Program Recommendations:    Referral received.  Patient saw Endocrinologist on 01/19/18.  He has had Type 1 Dm since age 89.  +Flu has likely contributed to DKA.  Discussed with RN by phone.  No needs at this time.   MD, please add Novolog 6 units tid with meals.  Also, based on home dose of Lantus, patient likely needs increase of Lantus to 25 units daily.  Please consider adding Lantus 10 units x 1 and start Lantus 25 units daily on 01/25/18.  Thanks,  Beryl MeagerJenny Merit Maybee, RN, BC-ADM Inpatient Diabetes Coordinator Pager [phone] (8a-5p)

## 2018-01-25 LAB — GLUCOSE, CAPILLARY: Glucose-Capillary: 254 mg/dL — ABNORMAL HIGH (ref 70–99)

## 2018-01-25 LAB — BASIC METABOLIC PANEL
Anion gap: 16 — ABNORMAL HIGH (ref 5–15)
BUN: 13 mg/dL (ref 6–20)
CO2: 21 mmol/L — ABNORMAL LOW (ref 22–32)
Calcium: 9 mg/dL (ref 8.9–10.3)
Chloride: 95 mmol/L — ABNORMAL LOW (ref 98–111)
Creatinine, Ser: 1.22 mg/dL (ref 0.61–1.24)
GFR calc Af Amer: >60 mL/min (ref >60)
GFR calc non Af Amer: >60 mL/min (ref >60)
Glucose, Bld: 251 mg/dL — ABNORMAL HIGH (ref 70–99)
POTASSIUM: 3.9 mmol/L (ref 3.5–5.1)
Sodium: 132 mmol/L — ABNORMAL LOW (ref 135–145)

## 2018-01-25 LAB — HEPATITIS PANEL, ACUTE
HCV Ab: <0.1 {s_co_ratio} (ref 0.0–0.9)
Hep A IgM: NEGATIVE
Hep B C IgM: NEGATIVE
Hepatitis B Surface Ag: NEGATIVE

## 2018-01-25 MED ORDER — LANTUS SOLOSTAR 100 UNIT/ML ~~LOC~~ SOPN: 25.0000 [IU] | PEN_INJECTOR | Freq: Every day | SUBCUTANEOUS | 11 refills | Status: DC

## 2018-01-25 MED ORDER — OSELTAMIVIR PHOSPHATE 75 MG PO CAPS: 75.0000 mg | ORAL_CAPSULE | Freq: Two times a day (BID) | ORAL | 0 refills | Status: AC

## 2018-01-25 MED ORDER — BENZONATATE 200 MG PO CAPS: 200.0000 mg | ORAL_CAPSULE | Freq: Three times a day (TID) | ORAL | 0 refills | Status: DC | PRN

## 2018-01-25 NOTE — Discharge Instructions (Signed)

## 2018-01-25 NOTE — Discharge Summary (Signed)
[facility]  Physician Discharge Summary   Patient ID: Logan Washington MRN: [id number] DOB/AGE: 2001/02/01 17 y.o.  Admit date: 01/23/2018 Discharge date: 01/25/2018  PCP: Patient, No Pcp Per  DISCHARGE DIAGNOSES:  Diabetic ketoacidosis, resolved Influenza Type 1 diabetes  RECOMMENDATIONS FOR OUTPATIENT FOLLOW UP: 1. Patient instructed to stay well-hydrated at home   DISCHARGE CONDITION: fair  Diet recommendation: Modified carbohydrate  Filed Weights   01/23/18 1456 01/24/18 1649  Weight: 68 kg 69.4 kg    INITIAL HISTORY: 17 y.o.malewith medical history significant oftype 1 diabetes, who presented withcough, runny nose, sore throat, subjective fever, chills, abdominal pain, chest pain.  Patient was found to be in diabetic ketoacidosis.  He also tested positive for influenza A.  Patient was hospitalized for further management.  He mentioned to the admitting provider that he had not taken his insulin in 2 days but he told me that he was taking his insulin.  Consultations:  None  Procedures:  None   HOSPITAL COURSE:   Diabetic ketoacidosis in the setting of type 1 diabetes Patient had elevated anion gap.  DKA was likely brought on by influenza.  Patient may not have taken his Lantus insulin the last 2 days prior to admission.  That could have also contributed.  Patient was initially placed on IV insulin.  Anion gap closed.  He was transitioned to Lantus.  HbA1c is 8.9.  Since his oral intake remains poor likely due to influenza he will be asked to take a lower dose of his Lantus at home.  His bicarbonate has improved.  His anion gap was noted to be 16 this morning however he is stable.  No signs or symptoms that are of concern at this time.  Okay for discharge.  Acute influenza Better but not back to his baseline.  He is however afebrile and saturating normal on room air.  Cough medications as needed.  Continue with Tamiflu to complete 5-day course.     Acute kidney injury/hyponatremia Most likely due to dehydration and hypovolemia.  Improved with IV hydration.  Back to baseline now.    Sodium levels are stable.  Chest pain and abdominal pain The symptoms are likely due to influenza as well as DKA.  EKG nonischemic.  Chest pain was primarily with coughing.  Patient reassured.  Lipase was normal.  LDL 55.  Troponin normal.  Mildly abnormal LFTs His AST was noted to be mildly elevated.    Normal when rechecked.  Abnormal urine drug screen Urine drug screen positive for opioids as well as THC.  Patient was counseled.  Patient better.  Not fully back to baseline however he can continue his recovery at home.  Okay for discharge today.     PERTINENT LABS:  The results of significant diagnostics from this hospitalization (including imaging, microbiology, ancillary and laboratory) are listed below for reference.    Microbiology: Recent Results (from the past 240 hour(s))  Blood culture (routine x 2)     Status: None (Preliminary result)   Collection Time: 01/23/18  6:15 PM  Result Value Ref Range Status   Specimen Description   Final    RIGHT ANTECUBITAL Performed at [facility], [address] [place], [place] [address]    Special Requests   Final    BOTTLES DRAWN AEROBIC AND ANAEROBIC Blood Culture adequate volume Performed at [facility], [address] [place], [place] [address]    Culture   Final    NO  GROWTH < 24 HOURS Performed at [facility], [address] [place], [place] [address]    Report Status PENDING  Incomplete  Blood culture (routine x 2)     Status: None (Preliminary result)   Collection Time: 01/23/18  6:17 PM  Result Value Ref Range Status   Specimen Description   Final    LEFT ANTECUBITAL Performed at [facility] [place], [address] [place], [place] [address]    Special Requests   Final    BOTTLES DRAWN AEROBIC AND ANAEROBIC Blood Culture adequate  volume Performed at [facility] [place], [address] [place], [place] [address]    Culture   Final    NO GROWTH < 24 HOURS Performed at [facility], [address] [place], [place] [address]    Report Status PENDING  Incomplete  MRSA PCR Screening     Status: None   Collection Time: 01/23/18  8:23 PM  Result Value Ref Range Status   MRSA by PCR NEGATIVE NEGATIVE Final    Comment:        The GeneXpert MRSA Assay (FDA approved for NASAL specimens only), is one component of a comprehensive MRSA colonization surveillance program. It is not intended to diagnose MRSA infection nor to guide or monitor treatment for MRSA infections. Performed at [facility], [address] [place], [place] [address]   Group A Strep by PCR     Status: None   Collection Time: 01/24/18  1:53 AM  Result Value Ref Range Status   Group A Strep by PCR NOT DETECTED NOT DETECTED Final    Comment: Performed at [facility], [address] [place], [place] [address]     Labs: Basic Metabolic Panel: Recent Labs  Lab 01/23/18 1519 01/23/18 2142 01/24/18 0210 01/24/18 1050 01/25/18 0647  NA 129* 132* 132* 134* 132*  K 4.5 4.2 4.0 4.3 3.9  CL 92* 104 106 101 95*  CO2 16* 15* 18* 18* 21*  GLUCOSE 352* 236* 157* 169* 251*  BUN 23* 26* 22* 16 13  CREATININE 1.41* 1.27* 1.04 1.02 1.22  CALCIUM 8.9 8.0* 7.8* 8.6* 9.0   Liver Function Tests: Recent Labs  Lab 01/23/18 1519 01/24/18 1050  AST 48* 27  ALT 23 20  ALKPHOS 72 58  BILITOT 2.2* 1.5*  PROT 7.5 6.8  ALBUMIN 4.1 3.7   Recent Labs  Lab 01/23/18 2242  LIPASE 16   CBC: Recent Labs  Lab 01/23/18 1519  WBC 7.6  HGB 15.5  HCT 47.2  MCV 85.4  PLT 296   Cardiac Enzymes: Recent Labs  Lab 01/23/18 2242 01/24/18 0527  TROPONINI <0.03 <0.03    CBG: Recent Labs  Lab 01/24/18 0759 01/24/18 1131 01/24/18 1603 01/24/18 2113 01/25/18 0731  GLUCAP 98 207* 139* 216* 254*      IMAGING STUDIES Dg Chest 2 View  Result Date: 01/23/2018 CLINICAL DATA:  DKA, fever EXAM: CHEST - 2 VIEW COMPARISON:  None. FINDINGS: The heart size and mediastinal contours are within normal limits. Both lungs are clear. The visualized skeletal structures are unremarkable. IMPRESSION: No active cardiopulmonary disease. Electronically Signed   By: Jasmine PangKim  Fujinaga M.D.   On: 01/23/2018 18:17    DISCHARGE EXAMINATION: Vitals:   01/24/18 1600 01/24/18 1649 01/24/18 2032 01/25/18 0537  BP: (!) 137/92 94/76 126/65 119/65  Pulse: 98 84 87 77  Resp: 12 19 19 19   Temp:  98.9 F (37.2 C)  98.8 F (37.1 C) 98 F (36.7 C)  TempSrc:  Oral Oral Oral  SpO2: 100% 100% 100% 100%  Weight:  69.4 kg    Height:  5' 8.5" (1.74 m)     General appearance: alert, cooperative, appears stated age and no distress Resp: clear to auscultation bilaterally Cardio: regular rate and rhythm, S1, S2 normal, no murmur, click, rub or gallop GI: soft, non-tender; bowel sounds normal; no masses,  no organomegaly  DISPOSITION: Home  Discharge Instructions    Call MD for:  difficulty breathing, headache or visual disturbances   Complete by:  As directed    Call MD for:  extreme fatigue   Complete by:  As directed    Call MD for:  persistant dizziness or light-headedness   Complete by:  As directed    Call MD for:  persistant nausea and vomiting   Complete by:  As directed    Call MD for:  severe uncontrolled pain   Complete by:  As directed    Call MD for:  temperature >100.4   Complete by:  As directed    Diet Carb Modified   Complete by:  As directed    Discharge instructions   Complete by:  As directed    Please follow up with your PCP within the next 3-4 days. Please keep a close track of your glucose levels. Take your medications as prescribed/. Do not miss doses of the insulin. Stay well hydrated.  You were cared for by a hospitalist during your hospital stay. If you have any questions about your  discharge medications or the care you received while you were in the hospital after you are discharged, you can call the unit and asked to speak with the hospitalist on call if the hospitalist that took care of you is not available. Once you are discharged, your primary care physician will handle any further medical issues. Please note that NO REFILLS for any discharge medications will be authorized once you are discharged, as it is imperative that you return to your primary care physician (or establish a relationship with a primary care physician if you do not have one) for your aftercare needs so that they can reassess your need for medications and monitor your lab values. If you do not have a primary care physician, you can call [phone] for a physician referral.   Increase activity slowly   Complete by:  As directed         Allergies as of 01/25/2018      Reactions   Shellfish Allergy Swelling      Medication List    TAKE these medications   B-D UF III MINI PEN NEEDLES 31G X 5 MM Misc Generic drug:  Insulin Pen Needle FOR USE WITH INSULIN PENS   benzonatate 200 MG capsule Commonly known as:  TESSALON Take 1 capsule (200 mg total) by mouth 3 (three) times daily as needed for cough.   GLUCAGEN DIAGNOSTIC 1 MG injection Generic drug:  glucagon (human recombinant) Inject 1 mL into the muscle once as needed for low blood sugar.   LANTUS SOLOSTAR 100 UNIT/ML Solostar Pen Generic drug:  Insulin Glargine Inject 25 Units into the skin at bedtime. What changed:  how much to take   NOVOLOG FLEXPEN 100 UNIT/ML FlexPen Generic drug:  insulin aspart INJECT UNDER SKIN WITH MEALS. USE UP TO 50 UNITS DAILY.   oseltamivir 75 MG capsule Commonly known as:  TAMIFLU Take 1 capsule (75 mg total) by  mouth 2 (two) times daily for 4 days.          TOTAL DISCHARGE TIME: 35 minutes  Osvaldo Shipper  [facility] Pager [phone]  01/25/2018, 1:06 PM

## 2018-01-25 NOTE — Progress Notes (Signed)
Discharge instructions and medications discussed with patient.  AVS and prescriptions given to patient.  All questions answered.  

## 2018-01-28 LAB — CULTURE, BLOOD (ROUTINE X 2)
Culture: NO GROWTH
Culture: NO GROWTH
Special Requests: ADEQUATE
Special Requests: ADEQUATE

## 2019-08-05 ENCOUNTER — Other Ambulatory Visit: Payer: Self-pay

## 2019-08-05 ENCOUNTER — Encounter (HOSPITAL_BASED_OUTPATIENT_CLINIC_OR_DEPARTMENT_OTHER): Payer: Self-pay | Admitting: *Deleted

## 2019-08-05 ENCOUNTER — Emergency Department (HOSPITAL_BASED_OUTPATIENT_CLINIC_OR_DEPARTMENT_OTHER)
Admission: EM | Admit: 2019-08-05 | Discharge: 2019-08-06 | Disposition: A | Discharge status: Home / Self Care | Payer: Medicaid Other | Attending: Emergency Medicine | Admitting: Emergency Medicine

## 2019-08-05 DIAGNOSIS — E1065 Type 1 diabetes mellitus with hyperglycemia: Secondary | ICD-10-CM | POA: Insufficient documentation

## 2019-08-05 DIAGNOSIS — Z794 Long term (current) use of insulin: Secondary | ICD-10-CM | POA: Diagnosis not present

## 2019-08-05 DIAGNOSIS — R109 Unspecified abdominal pain: Secondary | ICD-10-CM | POA: Diagnosis present

## 2019-08-05 DIAGNOSIS — R739 Hyperglycemia, unspecified: Secondary | ICD-10-CM

## 2019-08-05 LAB — URINALYSIS, ROUTINE W REFLEX MICROSCOPIC
Bilirubin Urine: NEGATIVE
Glucose, UA: >=500 mg/dL — AB
Hgb urine dipstick: NEGATIVE
Ketones, ur: NEGATIVE mg/dL
Leukocytes,Ua: NEGATIVE
Nitrite: NEGATIVE
Protein, ur: NEGATIVE mg/dL
Specific Gravity, Urine: 1.01 (ref 1.005–1.030)
pH: 6.5 (ref 5.0–8.0)

## 2019-08-05 LAB — I-STAT VENOUS BLOOD GAS, ED
Acid-Base Excess: 5 mmol/L — ABNORMAL HIGH (ref 0.0–2.0)
Bicarbonate: 30.9 mmol/L — ABNORMAL HIGH (ref 20.0–28.0)
Calcium, Ion: 1.25 mmol/L (ref 1.15–1.40)
HCT: 43 % (ref 39.0–52.0)
Hemoglobin: 14.6 g/dL (ref 13.0–17.0)
O2 Saturation: 96 %
Patient temperature: 98.6
Potassium: 3.9 mmol/L (ref 3.5–5.1)
Sodium: 136 mmol/L (ref 135–145)
TCO2: 32 mmol/L (ref 22–32)
pCO2, Ven: 49.5 mmHg (ref 44.0–60.0)
pH, Ven: 7.403 (ref 7.250–7.430)
pO2, Ven: 81 mmHg — ABNORMAL HIGH (ref 32.0–45.0)

## 2019-08-05 LAB — CBG MONITORING, ED
Glucose-Capillary: 389 mg/dL — ABNORMAL HIGH (ref 70–99)
Glucose-Capillary: 448 mg/dL — ABNORMAL HIGH (ref 70–99)
Glucose-Capillary: 544 mg/dL (ref 70–99)
Glucose-Capillary: 555 mg/dL (ref 70–99)

## 2019-08-05 LAB — CBC
HCT: 45.1 % (ref 39.0–52.0)
Hemoglobin: 15.3 g/dL (ref 13.0–17.0)
MCH: 29 pg (ref 26.0–34.0)
MCHC: 33.9 g/dL (ref 30.0–36.0)
MCV: 85.4 fL (ref 80.0–100.0)
Platelets: 385 10*3/uL (ref 150–400)
RBC: 5.28 MIL/uL (ref 4.22–5.81)
RDW: 12 % (ref 11.5–15.5)
WBC: 6.1 10*3/uL (ref 4.0–10.5)
nRBC: 0 % (ref 0.0–0.2)

## 2019-08-05 LAB — URINALYSIS, MICROSCOPIC (REFLEX)

## 2019-08-05 LAB — BASIC METABOLIC PANEL
Anion gap: 12 (ref 5–15)
BUN: 10 mg/dL (ref 6–20)
CO2: 24 mmol/L (ref 22–32)
Calcium: 8.6 mg/dL — ABNORMAL LOW (ref 8.9–10.3)
Chloride: 94 mmol/L — ABNORMAL LOW (ref 98–111)
Creatinine, Ser: 0.86 mg/dL (ref 0.61–1.24)
GFR calc Af Amer: >60 mL/min (ref >60)
GFR calc non Af Amer: >60 mL/min (ref >60)
Glucose, Bld: 588 mg/dL (ref 70–99)
Potassium: 4.2 mmol/L (ref 3.5–5.1)
Sodium: 130 mmol/L — ABNORMAL LOW (ref 135–145)

## 2019-08-05 MED ORDER — SODIUM CHLORIDE 0.9 % IV BOLUS
20.0000 mL/kg | Freq: Once | INTRAVENOUS | Status: AC
  Administered 2019-08-05: 1342 mL via INTRAVENOUS

## 2019-08-05 MED ORDER — SODIUM CHLORIDE 0.9 % IV BOLUS
1000.0000 mL | Freq: Once | INTRAVENOUS | Status: AC
  Administered 2019-08-05: 1000 mL via INTRAVENOUS

## 2019-08-05 MED ORDER — DEXTROSE 50 % IV SOLN
0.0000 mL | INTRAVENOUS | Status: DC | PRN
  Administered 2019-08-06: 20 mL via INTRAVENOUS
  Filled 2019-08-05: qty 50

## 2019-08-05 MED ORDER — ONDANSETRON HCL 4 MG/2ML IJ SOLN: 4.0000 mg | Freq: Once | INTRAMUSCULAR | Status: DC

## 2019-08-05 MED ORDER — INSULIN REGULAR(HUMAN) IN NACL 100-0.9 UT/100ML-% IV SOLN
INTRAVENOUS | Status: DC
  Administered 2019-08-05: 10 [IU]/h via INTRAVENOUS
  Filled 2019-08-05: qty 100

## 2019-08-05 MED ORDER — DEXTROSE-NACL 5-0.45 % IV SOLN: INTRAVENOUS | Status: DC

## 2019-08-05 NOTE — ED Provider Notes (Signed)
[facility] Provider Note: Logan Dell, MD, FACEP  CSN: [id number] MRN: [id number] ARRIVAL: 08/05/19 at 1906 ROOM: MH10/[facility]10   CHIEF COMPLAINT  Hyperglycemia   HISTORY OF PRESENT ILLNESS  08/05/19 11:30 PM Logan Washington is a 19 y.o. male with type 1 diabetes.  He is here with right flank pain that began about 2 hours prior to arrival.  He also noted his sugar was elevated to 533 and he took 3 extra units of regular insulin.  His sugar here on arrival was 555.  He was bolused with 1 L of normal saline on arrival to the room.   The right flank pain was sharp and intermittent, worse with certain movements.  It was moderate in severity but is no longer present.  He denies abdominal pain or shortness of breath.   Past Medical History:  Diagnosis Date  . Diabetes mellitus without complication (HCC)     History reviewed. No pertinent surgical history.  Family History  Problem Relation Age of Onset  . Sudden death Neg Hx   . Heart attack Neg Hx     Social History   Tobacco Use  . Smoking status: Never Smoker  . Smokeless tobacco: Never Used  Substance Use Topics  . Alcohol use: Not on file  . Drug use: Not on file    Prior to Admission medications   Medication Sig Start Date End Date Taking? Authorizing Provider  B-D UF III MINI PEN NEEDLES 31G X 5 MM MISC FOR USE WITH INSULIN PENS 10/12/15  Yes [provider]  glucagon, human recombinant, (GLUCAGEN DIAGNOSTIC) 1 MG injection Inject 1 mL into the muscle once as needed for low blood sugar. 08/30/15  Yes [provider]  LANTUS SOLOSTAR 100 UNIT/ML Solostar Pen Inject 25 Units into the skin at bedtime. 01/25/18  Yes Osvaldo Shipper, MD  NOVOLOG FLEXPEN 100 UNIT/ML FlexPen INJECT UNDER SKIN WITH MEALS. USE UP TO 50 UNITS DAILY. 10/09/15  Yes [provider]    Allergies Shellfish allergy   REVIEW OF SYSTEMS  Negative except as noted here or in the History of Present  Illness.   PHYSICAL EXAMINATION  Initial Vital Signs Blood pressure 127/75, pulse 63, temperature 98.5 F (36.9 C), temperature source Oral, resp. rate 19, height 5' 9.5" (1.765 m), weight 67.1 kg, SpO2 100 %.  Examination General: Well-developed, well-nourished male in no acute distress; appearance consistent with age of record HENT: normocephalic; atraumatic Eyes: pupils equal, round and reactive to light; extraocular muscles intact Neck: supple Heart: regular rate and rhythm Lungs: clear to auscultation bilaterally Abdomen: soft; nondistended; nontender; bowel sounds present Extremities: No deformity; full range of motion; pulses normal Neurologic: Awake, alert and oriented; motor function intact in all extremities and symmetric; no facial droop Skin: Warm and dry Psychiatric: Normal mood and affect   RESULTS  Summary of this visit's results, reviewed and interpreted by myself:   EKG Interpretation  Date/Time:    Ventricular Rate:    PR Interval:    QRS Duration:   QT Interval:    QTC Calculation:   R Axis:     Text Interpretation:        Laboratory Studies: Results for orders placed or performed during the hospital encounter of 08/05/19 (from the past 24 hour(s))  CBG monitoring, ED     Status: Abnormal   Collection Time: 08/05/19  7:18 PM  Result Value Ref Range   Glucose-Capillary 555 (HH) 70 - 99 mg/dL   Comment 1  Notify RN   Basic metabolic panel     Status: Abnormal   Collection Time: 08/05/19  7:24 PM  Result Value Ref Range   Sodium 130 (L) 135 - 145 mmol/L   Potassium 4.2 3.5 - 5.1 mmol/L   Chloride 94 (L) 98 - 111 mmol/L   CO2 24 22 - 32 mmol/L   Glucose, Bld 588 (HH) 70 - 99 mg/dL   BUN 10 6 - 20 mg/dL   Creatinine, Ser 7.09 0.61 - 1.24 mg/dL   Calcium 8.6 (L) 8.9 - 10.3 mg/dL   GFR calc non Af Amer >60 >60 mL/min   GFR calc Af Amer >60 >60 mL/min   Anion gap 12 5 - 15  CBC     Status: None   Collection Time: 08/05/19  7:24 PM  Result Value  Ref Range   WBC 6.1 4.0 - 10.5 K/uL   RBC 5.28 4.22 - 5.81 MIL/uL   Hemoglobin 15.3 13.0 - 17.0 g/dL   HCT 62.8 39 - 52 %   MCV 85.4 80.0 - 100.0 fL   MCH 29.0 26.0 - 34.0 pg   MCHC 33.9 30.0 - 36.0 g/dL   RDW 36.6 29.4 - 76.5 %   Platelets 385 150 - 400 K/uL   nRBC 0.0 0.0 - 0.2 %  Differential     Status: None   Collection Time: 08/05/19  7:24 PM  Result Value Ref Range   Neutrophils Relative % 53 %   Neutro Abs 3.3 1.7 - 7.7 K/uL   Lymphocytes Relative 36 %   Lymphs Abs 2.2 0.7 - 4.0 K/uL   Monocytes Relative 6 %   Monocytes Absolute 0.4 0 - 1 K/uL   Eosinophils Relative 3 %   Eosinophils Absolute 0.2 0 - 0 K/uL   Basophils Relative 1 %   Basophils Absolute 0.1 0 - 0 K/uL   nRBC 0 0 /100 WBC   Immature Granulocytes 0 %   Abs Immature Granulocytes 0.01 0.00 - 0.07 K/uL  Urinalysis, Routine w reflex microscopic     Status: Abnormal   Collection Time: 08/05/19  7:45 PM  Result Value Ref Range   Color, Urine YELLOW YELLOW   APPearance CLEAR CLEAR   Specific Gravity, Urine 1.010 1.005 - 1.030   pH 6.5 5.0 - 8.0   Glucose, UA >=500 (A) NEGATIVE mg/dL   Hgb urine dipstick NEGATIVE NEGATIVE   Bilirubin Urine NEGATIVE NEGATIVE   Ketones, ur NEGATIVE NEGATIVE mg/dL   Protein, ur NEGATIVE NEGATIVE mg/dL   Nitrite NEGATIVE NEGATIVE   Leukocytes,Ua NEGATIVE NEGATIVE  Urinalysis, Microscopic (reflex)     Status: Abnormal   Collection Time: 08/05/19  7:45 PM  Result Value Ref Range   RBC / HPF 0-5 0 - 5 RBC/hpf   WBC, UA 0-5 0 - 5 WBC/hpf   Bacteria, UA RARE (A) NONE SEEN   Squamous Epithelial / LPF 0-5 0 - 5  CBG monitoring, ED     Status: Abnormal   Collection Time: 08/05/19  9:55 PM  Result Value Ref Range   Glucose-Capillary 544 (HH) 70 - 99 mg/dL   Comment 1 Notify RN   CBG monitoring, ED     Status: Abnormal   Collection Time: 08/05/19 11:14 PM  Result Value Ref Range   Glucose-Capillary 448 (H) 70 - 99 mg/dL  CBG monitoring, ED     Status: Abnormal   Collection  Time: 08/05/19 11:44 PM  Result Value Ref Range   Glucose-Capillary 389 (H)  70 - 99 mg/dL  I-Stat venous blood gas, ED     Status: Abnormal   Collection Time: 08/05/19 11:51 PM  Result Value Ref Range   pH, Ven 7.403 7.25 - 7.43   pCO2, Ven 49.5 44 - 60 mmHg   pO2, Ven 81.0 (H) 32 - 45 mmHg   Bicarbonate 30.9 (H) 20.0 - 28.0 mmol/L   TCO2 32 22 - 32 mmol/L   O2 Saturation 96.0 %   Acid-Base Excess 5.0 (H) 0.0 - 2.0 mmol/L   Sodium 136 135 - 145 mmol/L   Potassium 3.9 3.5 - 5.1 mmol/L   Calcium, Ion 1.25 1.15 - 1.40 mmol/L   HCT 43.0 39 - 52 %   Hemoglobin 14.6 13.0 - 17.0 g/dL   Patient temperature 98.6 F    Collection site IV start    Drawn by Nurse    Sample type VENOUS   Hepatic function panel     Status: Abnormal   Collection Time: 08/05/19 11:56 PM  Result Value Ref Range   Total Protein 5.8 (L) 6.5 - 8.1 g/dL   Albumin 3.4 (L) 3.5 - 5.0 g/dL   AST 14 (L) 15 - 41 U/L   ALT 13 0 - 44 U/L   Alkaline Phosphatase 72 38 - 126 U/L   Total Bilirubin 1.1 0.3 - 1.2 mg/dL   Bilirubin, Direct 0.2 0.0 - 0.2 mg/dL   Indirect Bilirubin 0.9 0.3 - 0.9 mg/dL  Lipase, blood     Status: None   Collection Time: 08/05/19 11:56 PM  Result Value Ref Range   Lipase 26 11 - 51 U/L  CBG monitoring, ED     Status: None   Collection Time: 08/06/19 12:49 AM  Result Value Ref Range   Glucose-Capillary 77 70 - 99 mg/dL  CBG monitoring, ED     Status: Abnormal   Collection Time: 08/06/19  1:22 AM  Result Value Ref Range   Glucose-Capillary 57 (L) 70 - 99 mg/dL  CBG monitoring, ED     Status: Abnormal   Collection Time: 08/06/19  1:50 AM  Result Value Ref Range   Glucose-Capillary 118 (H) 70 - 99 mg/dL   Comment 1 Notify RN   CBG monitoring, ED     Status: Abnormal   Collection Time: 08/06/19  2:05 AM  Result Value Ref Range   Glucose-Capillary 134 (H) 70 - 99 mg/dL   Imaging Studies: No results found.  ED COURSE and MDM  Nursing notes, initial and subsequent vitals signs, including  pulse oximetry, reviewed and interpreted by myself.  Vitals:   08/05/19 1915 08/05/19 1917 08/05/19 2045 08/05/19 2349  BP:  127/75    Pulse:  87 63   Resp:  14 19   Temp:  98.5 F (36.9 C)  98 F (36.7 C)  TempSrc:  Oral  Oral  SpO2:  96% 100%   Weight: 67.1 kg     Height: 5' 9.5" (1.765 m)      Medications  insulin regular, human (MYXREDLIN) 100 units/ 100 mL infusion (0 Units/hr Intravenous Paused 08/06/19 0053)  dextrose 5 %-0.45 % sodium chloride infusion ( Intravenous New Bag/Given 08/06/19 0108)  dextrose 50 % solution 0-50 mL (20 mLs Intravenous Given 08/06/19 0131)  ondansetron (ZOFRAN) injection 4 mg (4 mg Intravenous Refused 08/06/19 0112)  sodium chloride 0.9 % bolus 1,000 mL (0 mLs Intravenous Stopped 08/05/19 2337)  sodium chloride 0.9 % bolus 1,342 mL (0 mL/kg  67.1 kg Intravenous Stopped 08/06/19 0053)  1:18 AM Glucose down to 77.  Insulin infusion discontinued patient placed on D5 half-normal saline drip.  Will recheck glucose and discharge when stable.  No evidence of DKA.  2:15 AM CBG 134.  Patient has eaten.  He states he needs to leave so he can go to work this morning.  He was advised to pay close attention to his blood sugar.   PROCEDURES  Procedures   ED DIAGNOSES     ICD-10-CM   1. Hyperglycemia  R73.9        Boyde Grieco, Jonny Ruiz, MD 08/06/19 [phone]

## 2019-08-05 NOTE — ED Triage Notes (Signed)
Right flank pain x 2 hours. Hx of diabetes. CBG was 533 an hour ago. He took extra 3 units of regular insulin.

## 2019-08-05 NOTE — ED Notes (Signed)
Date and time results received: 08/05/19 1958   Test: glucose Critical Value: 588  Name of Provider Notified: [facility]

## 2019-08-06 LAB — DIFFERENTIAL
Abs Immature Granulocytes: 0.01 10*3/uL (ref 0.00–0.07)
Basophils Absolute: 0.1 10*3/uL (ref 0.0–0.1)
Basophils Relative: 1 %
Eosinophils Absolute: 0.2 10*3/uL (ref 0.0–0.5)
Eosinophils Relative: 3 %
Immature Granulocytes: 0 %
Lymphocytes Relative: 36 %
Lymphs Abs: 2.2 10*3/uL (ref 0.7–4.0)
Monocytes Absolute: 0.4 10*3/uL (ref 0.1–1.0)
Monocytes Relative: 6 %
Neutro Abs: 3.3 10*3/uL (ref 1.7–7.7)
Neutrophils Relative %: 53 %
nRBC: 0 /100 WBC

## 2019-08-06 LAB — CBG MONITORING, ED
Glucose-Capillary: 118 mg/dL — ABNORMAL HIGH (ref 70–99)
Glucose-Capillary: 134 mg/dL — ABNORMAL HIGH (ref 70–99)
Glucose-Capillary: 57 mg/dL — ABNORMAL LOW (ref 70–99)
Glucose-Capillary: 77 mg/dL (ref 70–99)

## 2019-08-06 LAB — HEPATIC FUNCTION PANEL
ALT: 13 U/L (ref 0–44)
AST: 14 U/L — ABNORMAL LOW (ref 15–41)
Albumin: 3.4 g/dL — ABNORMAL LOW (ref 3.5–5.0)
Alkaline Phosphatase: 72 U/L (ref 38–126)
Bilirubin, Direct: 0.2 mg/dL (ref 0.0–0.2)
Indirect Bilirubin: 0.9 mg/dL (ref 0.3–0.9)
Total Bilirubin: 1.1 mg/dL (ref 0.3–1.2)
Total Protein: 5.8 g/dL — ABNORMAL LOW (ref 6.5–8.1)

## 2019-08-06 LAB — LIPASE, BLOOD: Lipase: 26 U/L (ref 11–51)

## 2019-08-06 NOTE — ED Notes (Signed)
Snack and juice given 

## 2019-08-06 NOTE — ED Notes (Signed)
Checked CBG 118, RN Emilie informed

## 2019-08-06 NOTE — ED Notes (Signed)
Pt states he wants to go home, EDP notified. Okay to d/c endotool and remove IV per Dr. Read Drivers

## 2019-08-12 LAB — BLOOD GAS, VENOUS

## 2019-09-09 ENCOUNTER — Other Ambulatory Visit: Payer: Self-pay

## 2019-09-09 ENCOUNTER — Emergency Department (HOSPITAL_BASED_OUTPATIENT_CLINIC_OR_DEPARTMENT_OTHER)
Admission: EM | Admit: 2019-09-09 | Discharge: 2019-09-09 | Disposition: A | Discharge status: Home / Self Care | Payer: Medicaid Other | Attending: Emergency Medicine | Admitting: Emergency Medicine

## 2019-09-09 ENCOUNTER — Encounter (HOSPITAL_BASED_OUTPATIENT_CLINIC_OR_DEPARTMENT_OTHER): Payer: Self-pay

## 2019-09-09 DIAGNOSIS — R109 Unspecified abdominal pain: Secondary | ICD-10-CM | POA: Diagnosis not present

## 2019-09-09 DIAGNOSIS — Z794 Long term (current) use of insulin: Secondary | ICD-10-CM | POA: Insufficient documentation

## 2019-09-09 DIAGNOSIS — E119 Type 2 diabetes mellitus without complications: Secondary | ICD-10-CM | POA: Insufficient documentation

## 2019-09-09 LAB — URINALYSIS, ROUTINE W REFLEX MICROSCOPIC
Bilirubin Urine: NEGATIVE
Glucose, UA: >=500 mg/dL — AB
Hgb urine dipstick: NEGATIVE
Ketones, ur: NEGATIVE mg/dL
Leukocytes,Ua: NEGATIVE
Nitrite: NEGATIVE
Protein, ur: NEGATIVE mg/dL
Specific Gravity, Urine: 1.02 (ref 1.005–1.030)
pH: 6 (ref 5.0–8.0)

## 2019-09-09 LAB — CBC WITH DIFFERENTIAL/PLATELET
Abs Immature Granulocytes: 0.02 10*3/uL (ref 0.00–0.07)
Basophils Absolute: 0.1 10*3/uL (ref 0.0–0.1)
Basophils Relative: 1 %
Eosinophils Absolute: 0.2 10*3/uL (ref 0.0–0.5)
Eosinophils Relative: 3 %
HCT: 45.4 % (ref 39.0–52.0)
Hemoglobin: 15.6 g/dL (ref 13.0–17.0)
Immature Granulocytes: 0 %
Lymphocytes Relative: 35 %
Lymphs Abs: 2.3 10*3/uL (ref 0.7–4.0)
MCH: 29 pg (ref 26.0–34.0)
MCHC: 34.4 g/dL (ref 30.0–36.0)
MCV: 84.4 fL (ref 80.0–100.0)
Monocytes Absolute: 0.4 10*3/uL (ref 0.1–1.0)
Monocytes Relative: 6 %
Neutro Abs: 3.7 10*3/uL (ref 1.7–7.7)
Neutrophils Relative %: 55 %
Platelets: 322 10*3/uL (ref 150–400)
RBC: 5.38 MIL/uL (ref 4.22–5.81)
RDW: 12.1 % (ref 11.5–15.5)
WBC: 6.7 10*3/uL (ref 4.0–10.5)
nRBC: 0 % (ref 0.0–0.2)

## 2019-09-09 LAB — BASIC METABOLIC PANEL
Anion gap: 10 (ref 5–15)
BUN: 19 mg/dL (ref 6–20)
CO2: 24 mmol/L (ref 22–32)
Calcium: 8.9 mg/dL (ref 8.9–10.3)
Chloride: 100 mmol/L (ref 98–111)
Creatinine, Ser: 0.84 mg/dL (ref 0.61–1.24)
GFR calc Af Amer: >60 mL/min (ref >60)
GFR calc non Af Amer: >60 mL/min (ref >60)
Glucose, Bld: 214 mg/dL — ABNORMAL HIGH (ref 70–99)
Potassium: 4.2 mmol/L (ref 3.5–5.1)
Sodium: 134 mmol/L — ABNORMAL LOW (ref 135–145)

## 2019-09-09 LAB — URINALYSIS, MICROSCOPIC (REFLEX)

## 2019-09-09 MED ORDER — KETOROLAC TROMETHAMINE 15 MG/ML IJ SOLN
15.0000 mg | Freq: Once | INTRAMUSCULAR | Status: AC
  Administered 2019-09-09: 15 mg via INTRAMUSCULAR
  Filled 2019-09-09: qty 1

## 2019-09-09 NOTE — ED Notes (Signed)
Pt requests to see the provider for exam, stating has to pick up child.  Provider made aware, next to be seen.

## 2019-09-09 NOTE — Discharge Instructions (Addendum)
Follow-up with your primary care, his pain starts to become worse than the need to come back here for CT to rule out kidney stone as we spoke about. I want you to take ibuprofen for the next couple of days for the pain as prescribed on the bottle for the next 5 days. Try and stop lifting heavy objects at work for the next couple of days. Your glucose was elevated today, will you to follow-up with your primary care about your diabetes.

## 2019-09-09 NOTE — ED Provider Notes (Signed)
[facility] EMERGENCY DEPARTMENT Provider Note   CSN: [id number] Arrival date & time: 09/09/19  1128     History Chief Complaint  Patient presents with  . Flank Pain    Tedric Leeth is a 19 y.o. male with past medical history of diabetes that presents the emerge department today for flank pain bilaterally. Patient states that 1 week ago he started having bilateral flank pain, states that he does lift heavy objects for work. States that he has been lifting heavier weighted objects lately.No alleviating or worsening factors. States that the pain has been constant, never had any pain like this before. No history of kidney stones. No hematuria, dysuria. No abdominal pain, groin pain, penile swelling, scrotal swelling, penile discharge. Patient states that the pain does not radiate anywhere. States that he has not been taking anything for the pain. Denies any chest pain, shortness of breath, nausea, vomiting, fevers, chills, URI-like symptoms, paresthesias, weakness, headache. Patient denies any IV drug use, true back pain, saddle paresthesias, urinary incontinence, bowel incontinence, cancer history.  HPI     Past Medical History:  Diagnosis Date  . Diabetes mellitus without complication [facility])     Patient Active Problem List   Diagnosis Date Noted  . Influenza A 01/24/2018  . Abdominal pain 01/24/2018  . DKA, type 1 (HCC) 01/23/2018  . AKI (acute kidney injury) (HCC) 01/23/2018  . Abnormal LFTs 01/23/2018  . Chest pain 01/23/2018  . Diabetes mellitus without complication (HCC)   . Sports physical 10/25/2015    History reviewed. No pertinent surgical history.     Family History  Problem Relation Age of Onset  . Sudden death Neg Hx   . Heart attack Neg Hx     Social History   Tobacco Use  . Smoking status: Never Smoker  . Smokeless tobacco: Never Used  Vaping Use  . Vaping Use: Never used  Substance Use Topics  . Alcohol use: Not Currently  . Drug use:  Never    Home Medications Prior to Admission medications   Medication Sig Start Date End Date Taking? Authorizing Provider  glucagon, human recombinant, (GLUCAGEN DIAGNOSTIC) 1 MG injection Inject 1 mL into the muscle once as needed for low blood sugar. 08/30/15  Yes [provider]  LANTUS SOLOSTAR 100 UNIT/ML Solostar Pen Inject 25 Units into the skin at bedtime. Patient taking differently: Inject 31 Units into the skin at bedtime.  01/25/18  Yes Osvaldo Shipper, MD  NOVOLOG FLEXPEN 100 UNIT/ML FlexPen Inject 6-7 Units into the skin 3 (three) times daily with meals. Every 7 carbs =1 unit 10/09/15  Yes [provider]  B-D UF III MINI PEN NEEDLES 31G X 5 MM MISC FOR USE WITH INSULIN PENS 10/12/15   [provider]    Allergies    Tilapia [fish allergy]  Review of Systems   Review of Systems  Constitutional: Negative for chills, diaphoresis, fatigue and fever.  HENT: Negative for congestion, sore throat and trouble swallowing.   Eyes: Negative for pain and visual disturbance.  Respiratory: Negative for cough, shortness of breath and wheezing.   Cardiovascular: Negative for chest pain, palpitations and leg swelling.  Gastrointestinal: Negative for abdominal distention, abdominal pain, diarrhea, nausea and vomiting.  Genitourinary: Positive for flank pain. Negative for decreased urine volume, difficulty urinating, discharge, frequency, penile pain, testicular pain and urgency.  Musculoskeletal: Negative for back pain, neck pain and neck stiffness.  Skin: Negative for pallor.  Neurological: Negative for dizziness, speech difficulty, weakness and  headaches.  Psychiatric/Behavioral: Negative for confusion.    Physical Exam Updated Vital Signs BP 116/72 (BP Location: Left Arm)   Pulse 77   Temp 98.9 F (37.2 C) (Oral)   Resp 18   Ht 5\' 10"  (1.778 m)   Wt 70.8 kg   SpO2 99%   BMI 22.38 kg/m   Physical Exam Constitutional:      General: He is not in acute  distress.    Appearance: Normal appearance. He is not ill-appearing, toxic-appearing or diaphoretic.     Comments: Patient appears comfortable in bed, no acute distress.  HENT:     Head: Normocephalic and atraumatic.     Mouth/Throat:     Mouth: Mucous membranes are moist.     Pharynx: Oropharynx is clear.  Eyes:     General: No scleral icterus.    Extraocular Movements: Extraocular movements intact.     Pupils: Pupils are equal, round, and reactive to light.  Cardiovascular:     Rate and Rhythm: Normal rate and regular rhythm.     Pulses: Normal pulses.     Heart sounds: Normal heart sounds.  Pulmonary:     Effort: Pulmonary effort is normal. No respiratory distress.     Breath sounds: Normal breath sounds. No stridor. No wheezing, rhonchi or rales.  Chest:     Chest wall: No tenderness.  Abdominal:     General: Abdomen is flat. There is no distension.     Palpations: Abdomen is soft.     Tenderness: There is no abdominal tenderness. There is right CVA tenderness and left CVA tenderness. There is no guarding or rebound.  Musculoskeletal:        General: No swelling or tenderness. Normal range of motion.     Cervical back: Normal range of motion and neck supple. No rigidity.     Right lower leg: No edema.     Left lower leg: No edema.     Comments: No midline cervical, thoracic, lumbar tenderness, no paraspinal muscle tenderness. Patient is able to move neck directions. No erythema, warmth, objective numbness noted. Normal strength and range of motion throughout. Normal sensation throughout. No objective numbness in groin area.  Skin:    General: Skin is warm and dry.     Capillary Refill: Capillary refill takes less than 2 seconds.     Coloration: Skin is not pale.  Neurological:     General: No focal deficit present.     Mental Status: He is alert and oriented to person, place, and time.     Cranial Nerves: No cranial nerve deficit.     Sensory: No sensory deficit.      Motor: No weakness.     Coordination: Coordination normal.     Gait: Gait normal.  Psychiatric:        Mood and Affect: Mood normal.        Behavior: Behavior normal.     ED Results / Procedures / Treatments   Labs (all labs ordered are listed, but only abnormal results are displayed) Labs Reviewed  URINALYSIS, ROUTINE W REFLEX MICROSCOPIC - Abnormal; Notable for the following components:      Result Value   Glucose, UA >=500 (*)    All other components within normal limits  BASIC METABOLIC PANEL - Abnormal; Notable for the following components:   Sodium 134 (*)    Glucose, Bld 214 (*)    All other components within normal limits  URINALYSIS, MICROSCOPIC (REFLEX) - Abnormal; Notable  for the following components:   Bacteria, UA RARE (*)    All other components within normal limits  CBC WITH DIFFERENTIAL/PLATELET    EKG None  Radiology No results found.  Procedures Procedures (including critical care time)  Medications Ordered in ED Medications  ketorolac (TORADOL) 15 MG/ML injection 15 mg (15 mg Intramuscular Given 09/09/19 1425)    ED Course  I have reviewed the triage vital signs and the nursing notes.  Pertinent labs & imaging results that were available during my care of the patient were reviewed by me and considered in my medical decision making (see chart for details).    MDM Rules/Calculators/A&P                         Stevin Bielinski is a 19 y.o. male with past medical history of diabetes that presents the emerge department today for flank pain bilaterally. Attempted calling legal guardian since this is an active FYI, no answer. Patient is competent enough to make own decisions at this time. I think this is most likely due to muscle strain, did discuss that he probably needs a CT at this time to rule out kidney stone or hydronephrosis. States that he has to go pick up his son from work and therefore does not want CT scans at this time, states that pain is  bearable enough to come back tomorrow if the pain is still there. States he will come back if the ibuprofen does not control his pain tomorrow. . I think that it is unlikely to be kidney stone at this time, normal vitals, no fever, no nausea or vomiting. Patient appears comfortable in bed. Patient be discharged at this time since patient wants to leave. CBC and CMP without any abnormalities, no urinary symptoms. Urinalysis negative for infection. Return precautions expressed in depth.  Doubt need for further emergent work up at this time. I explained the diagnosis and have given explicit precautions to return to the ER including for any other new or worsening symptoms. The patient understands and accepts the medical plan as it's been dictated and I have answered their questions. Discharge instructions concerning home care and prescriptions have been given. The patient is STABLE and is discharged to home in good condition.  Final Clinical Impression(s) / ED Diagnoses Final diagnoses:  Flank pain    Rx / DC Orders ED Discharge Orders    None       Farrel Gordon, PA-C 09/09/19 1436    Terald Sleeper, MD 09/09/19 [phone]

## 2019-09-09 NOTE — ED Triage Notes (Signed)
Pt c/o left flank pain x 1 week-NAD-steady gait

## 2019-09-26 ENCOUNTER — Other Ambulatory Visit: Payer: Self-pay

## 2019-09-26 ENCOUNTER — Emergency Department (HOSPITAL_BASED_OUTPATIENT_CLINIC_OR_DEPARTMENT_OTHER): Payer: Medicaid Other

## 2019-09-26 ENCOUNTER — Emergency Department (HOSPITAL_BASED_OUTPATIENT_CLINIC_OR_DEPARTMENT_OTHER)
Admission: EM | Admit: 2019-09-26 | Discharge: 2019-09-26 | Disposition: A | Discharge status: Home / Self Care | Payer: Medicaid Other | Attending: Emergency Medicine | Admitting: Emergency Medicine

## 2019-09-26 ENCOUNTER — Encounter (HOSPITAL_BASED_OUTPATIENT_CLINIC_OR_DEPARTMENT_OTHER): Payer: Self-pay | Admitting: *Deleted

## 2019-09-26 DIAGNOSIS — E1010 Type 1 diabetes mellitus with ketoacidosis without coma: Secondary | ICD-10-CM | POA: Insufficient documentation

## 2019-09-26 DIAGNOSIS — R1084 Generalized abdominal pain: Secondary | ICD-10-CM | POA: Insufficient documentation

## 2019-09-26 DIAGNOSIS — R112 Nausea with vomiting, unspecified: Secondary | ICD-10-CM | POA: Diagnosis not present

## 2019-09-26 DIAGNOSIS — E876 Hypokalemia: Secondary | ICD-10-CM | POA: Diagnosis not present

## 2019-09-26 LAB — RAPID URINE DRUG SCREEN, HOSP PERFORMED
Amphetamines: NOT DETECTED
Barbiturates: NOT DETECTED
Benzodiazepines: NOT DETECTED
Cocaine: NOT DETECTED
Opiates: NOT DETECTED
Tetrahydrocannabinol: POSITIVE — AB

## 2019-09-26 LAB — COMPREHENSIVE METABOLIC PANEL
ALT: 13 U/L (ref 0–44)
AST: 22 U/L (ref 15–41)
Albumin: 4.4 g/dL (ref 3.5–5.0)
Alkaline Phosphatase: 70 U/L (ref 38–126)
Anion gap: 16 — ABNORMAL HIGH (ref 5–15)
BUN: 14 mg/dL (ref 6–20)
CO2: 24 mmol/L (ref 22–32)
Calcium: 9.2 mg/dL (ref 8.9–10.3)
Chloride: 96 mmol/L — ABNORMAL LOW (ref 98–111)
Creatinine, Ser: 1.15 mg/dL (ref 0.61–1.24)
GFR calc Af Amer: >60 mL/min (ref >60)
GFR calc non Af Amer: >60 mL/min (ref >60)
Glucose, Bld: 77 mg/dL (ref 70–99)
Potassium: 3.1 mmol/L — ABNORMAL LOW (ref 3.5–5.1)
Sodium: 136 mmol/L (ref 135–145)
Total Bilirubin: 0.8 mg/dL (ref 0.3–1.2)
Total Protein: 8.1 g/dL (ref 6.5–8.1)

## 2019-09-26 LAB — CBC
HCT: 50.5 % (ref 39.0–52.0)
Hemoglobin: 17.6 g/dL — ABNORMAL HIGH (ref 13.0–17.0)
MCH: 29 pg (ref 26.0–34.0)
MCHC: 34.9 g/dL (ref 30.0–36.0)
MCV: 83.2 fL (ref 80.0–100.0)
Platelets: 267 10*3/uL (ref 150–400)
RBC: 6.07 MIL/uL — ABNORMAL HIGH (ref 4.22–5.81)
RDW: 11.9 % (ref 11.5–15.5)
WBC: 4.4 10*3/uL (ref 4.0–10.5)
nRBC: 0 % (ref 0.0–0.2)

## 2019-09-26 LAB — I-STAT VENOUS BLOOD GAS, ED
Acid-Base Excess: 5 mmol/L — ABNORMAL HIGH (ref 0.0–2.0)
Bicarbonate: 30.2 mmol/L — ABNORMAL HIGH (ref 20.0–28.0)
Calcium, Ion: 1.18 mmol/L (ref 1.15–1.40)
HCT: 51 % (ref 39.0–52.0)
Hemoglobin: 17.3 g/dL — ABNORMAL HIGH (ref 13.0–17.0)
O2 Saturation: 35 %
Patient temperature: 98.6
Potassium: 3.2 mmol/L — ABNORMAL LOW (ref 3.5–5.1)
Sodium: 138 mmol/L (ref 135–145)
TCO2: 32 mmol/L (ref 22–32)
pCO2, Ven: 45.6 mmHg (ref 44.0–60.0)
pH, Ven: 7.429 (ref 7.250–7.430)
pO2, Ven: 21 mmHg — CL (ref 32.0–45.0)

## 2019-09-26 LAB — URINALYSIS, ROUTINE W REFLEX MICROSCOPIC
Glucose, UA: NEGATIVE mg/dL
Hgb urine dipstick: NEGATIVE
Ketones, ur: 40 mg/dL — AB
Leukocytes,Ua: NEGATIVE
Nitrite: NEGATIVE
Protein, ur: 30 mg/dL — AB
Specific Gravity, Urine: 1.01 (ref 1.005–1.030)
pH: 6.5 (ref 5.0–8.0)

## 2019-09-26 LAB — URINALYSIS, MICROSCOPIC (REFLEX)
Bacteria, UA: NONE SEEN
Squamous Epithelial / HPF: NONE SEEN (ref 0–5)

## 2019-09-26 LAB — LIPASE, BLOOD: Lipase: 19 U/L (ref 11–51)

## 2019-09-26 LAB — CBG MONITORING, ED: Glucose-Capillary: 80 mg/dL (ref 70–99)

## 2019-09-26 MED ORDER — ONDANSETRON HCL 4 MG/2ML IJ SOLN
4.0000 mg | Freq: Once | INTRAMUSCULAR | Status: AC
  Administered 2019-09-26: 4 mg via INTRAVENOUS
  Filled 2019-09-26: qty 2

## 2019-09-26 MED ORDER — POTASSIUM CHLORIDE CRYS ER 20 MEQ PO TBCR
40.0000 meq | EXTENDED_RELEASE_TABLET | Freq: Every day | ORAL | 0 refills | Status: DC
Start: 2019-09-26 — End: 2019-11-17

## 2019-09-26 MED ORDER — SODIUM CHLORIDE 0.9 % IV BOLUS
1000.0000 mL | Freq: Once | INTRAVENOUS | Status: AC
  Administered 2019-09-26: 1000 mL via INTRAVENOUS

## 2019-09-26 MED ORDER — IOHEXOL 300 MG/ML  SOLN
100.0000 mL | Freq: Once | INTRAMUSCULAR | Status: AC | PRN
  Administered 2019-09-26: 100 mL via INTRAVENOUS

## 2019-09-26 MED ORDER — ONDANSETRON 4 MG PO TBDP: 4.0000 mg | ORAL_TABLET | Freq: Three times a day (TID) | ORAL | 0 refills | Status: DC | PRN

## 2019-09-26 MED ORDER — DICYCLOMINE HCL 20 MG PO TABS
20.0000 mg | ORAL_TABLET | Freq: Three times a day (TID) | ORAL | 0 refills | Status: DC | PRN
Start: 2019-09-26 — End: 2019-11-17

## 2019-09-26 NOTE — Discharge Instructions (Signed)
You were seen in the emergency department today with abdominal pain and vomiting.  I have called in medicines to help with your symptoms which you can fill at any pharmacy.  I have also called in prescriptions for potassium.  Her potassium here was slightly low.  Please establish care with a primary care doctor who can recheck your potassium labs in the next 1 to 2 weeks.  Return to the emergency department any fevers, chills, worsening pain.

## 2019-09-26 NOTE — ED Notes (Signed)
VBG hand delivered to DR. Long

## 2019-09-26 NOTE — ED Notes (Signed)
EDP at bedside  

## 2019-09-26 NOTE — ED Triage Notes (Addendum)
C/o generalized abd pain x 5 days. Vomited x 1 upon arrival to ED. States he is having back pain also. EMS transport from home. Pt is a type 1 diabetic. Reports CBG prior to transport was 72

## 2019-09-26 NOTE — ED Notes (Signed)
Pt was given water for fluid challenge. 

## 2019-09-26 NOTE — ED Notes (Signed)
Pt to CT

## 2019-09-26 NOTE — ED Provider Notes (Signed)
Emergency Department Provider Note   I have reviewed the triage vital signs and the nursing notes.   HISTORY  Chief Complaint Abdominal Pain   HPI Logan Washington is a 19 y.o. male with past medical history of diabetes presents emergency department with 5 days of constant, generalized abdominal pain with one episode of nonbloody emesis today.  Pain radiates somewhat to the back.  Patient ultimately called EMS for transport.  His blood sugars have been normal to slightly low at home.  He denies any associated diarrhea.  Patient states he had similar pain in the past and reports having a CT scan performed at [facility]. He cannot recall the results and does not recall being told need to follow with a specialist or starting new medications.  He is not had fevers.  He is not having any upper respiratory infection symptoms.  Past Medical History:  Diagnosis Date  . Diabetes mellitus without complication [facility])     Patient Active Problem List   Diagnosis Date Noted  . Influenza A 01/24/2018  . Abdominal pain 01/24/2018  . DKA, type 1 (HCC) 01/23/2018  . AKI (acute kidney injury) (HCC) 01/23/2018  . Abnormal LFTs 01/23/2018  . Chest pain 01/23/2018  . Diabetes mellitus without complication (HCC)   . Sports physical 10/25/2015    Past Surgical History:  Procedure Laterality Date  . TONSILLECTOMY      Allergies Tilapia [fish allergy]  Family History  Problem Relation Age of Onset  . Sudden death Neg Hx   . Heart attack Neg Hx     Social History Social History   Tobacco Use  . Smoking status: Never Smoker  . Smokeless tobacco: Never Used  Vaping Use  . Vaping Use: Never used  Substance Use Topics  . Alcohol use: Not Currently  . Drug use: Never    Review of Systems  Constitutional: No fever/chills Eyes: No visual changes. ENT: No sore throat. Cardiovascular: Denies chest pain. Respiratory: Denies shortness of breath. Gastrointestinal: Positive  abdominal pain. Positive nausea and vomiting.  No diarrhea.  No constipation. Genitourinary: Negative for dysuria. Musculoskeletal: Negative for back pain. Skin: Negative for rash. Neurological: Negative for headaches, focal weakness or numbness.  10-point ROS otherwise negative.  ____________________________________________   PHYSICAL EXAM:  VITAL SIGNS: ED Triage Vitals  Enc Vitals Group     BP 09/26/19 0034 133/87     Pulse Rate 09/26/19 0034 92     Resp 09/26/19 0034 16     Temp 09/26/19 0034 98.2 F (36.8 C)     Temp Source 09/26/19 0034 Oral     SpO2 09/26/19 0034 100 %     Weight 09/26/19 0036 150 lb (68 kg)     Height 09/26/19 0036 5\' 11"  (1.803 m)   Constitutional: Alert and oriented. Well appearing and in no acute distress. Eyes: Conjunctivae are normal.  Head: Atraumatic. Nose: No congestion/rhinnorhea. Mouth/Throat: Mucous membranes are moist.   Cardiovascular: Normal rate, regular rhythm. Good peripheral circulation. Grossly normal heart sounds.   Respiratory: Normal respiratory effort.  No retractions. Lungs CTAB. Gastrointestinal: Soft with moderate diffuse tenderness on exam. No peritoneal findings. No distention.  Musculoskeletal: No gross deformities of extremities. Neurologic:  Normal speech and language.  Skin:  Skin is warm, dry and intact. No rash noted.   ____________________________________________   LABS (all labs ordered are listed, but only abnormal results are displayed)  Labs Reviewed  COMPREHENSIVE METABOLIC PANEL - Abnormal; Notable for the following components:  Result Value   Potassium 3.1 (*)    Chloride 96 (*)    Anion gap 16 (*)    All other components within normal limits  CBC - Abnormal; Notable for the following components:   RBC 6.07 (*)    Hemoglobin 17.6 (*)    All other components within normal limits  URINALYSIS, ROUTINE W REFLEX MICROSCOPIC - Abnormal; Notable for the following components:   Bilirubin Urine SMALL  (*)    Ketones, ur 40 (*)    Protein, ur 30 (*)    All other components within normal limits  RAPID URINE DRUG SCREEN, HOSP PERFORMED - Abnormal; Notable for the following components:   Tetrahydrocannabinol POSITIVE (*)    All other components within normal limits  I-STAT VENOUS BLOOD GAS, ED - Abnormal; Notable for the following components:   pO2, Ven 21.0 (*)    Bicarbonate 30.2 (*)    Acid-Base Excess 5.0 (*)    Potassium 3.2 (*)    Hemoglobin 17.3 (*)    All other components within normal limits  LIPASE, BLOOD  URINALYSIS, MICROSCOPIC (REFLEX)  CBG MONITORING, ED   ____________________________________________  RADIOLOGY  CT ABDOMEN PELVIS W CONTRAST  Result Date: 09/26/2019 CLINICAL DATA:  Abdomen distension with nausea and vomiting EXAM: CT ABDOMEN AND PELVIS WITH CONTRAST TECHNIQUE: Multidetector CT imaging of the abdomen and pelvis was performed using the standard protocol following bolus administration of intravenous contrast. CONTRAST:  OMNIPAQUE IOHEXOL 300 MG/ML  SOLN COMPARISON:  CT 07/01/2019 FINDINGS: Lower chest: Lung bases demonstrate no acute consolidation or effusion. Normal cardiac size. Hepatobiliary: No focal liver abnormality is seen. No gallstones, gallbladder wall thickening, or biliary dilatation. Pancreas: Unremarkable. No pancreatic ductal dilatation or surrounding inflammatory changes. Spleen: Normal in size without focal abnormality. Adrenals/Urinary Tract: Adrenal glands are normal. Kidneys are within normal limits. Thick-walled appearance of the urinary bladder. Stomach/Bowel: There is motion degradation. Questionable wall thickening versus collapsed appearance of the ascending colon with questionable wall thickening of the distal transverse colon. The appendix cannot be identified with certainty. Vascular/Lymphatic: No significant vascular findings are present. No enlarged abdominal or pelvic lymph nodes. Reproductive: Prostate is unremarkable. Other: No  abdominal wall hernia or abnormality. No abdominopelvic ascites. Musculoskeletal: No acute or significant osseous findings. IMPRESSION: 1. Motion degradation. 2. Wall thickening/mild colitis type changes versus collapsed appearance of the ascending colon and distal transverse colon. 3. Thick-walled appearance of the urinary bladder, question cystitis. Suggest correlation with urinalysis Electronically Signed   By: Jasmine Pang M.D.   On: 09/26/2019 01:58    ____________________________________________   PROCEDURES  Procedure(s) performed:   Procedures  None  ____________________________________________   INITIAL IMPRESSION / ASSESSMENT AND PLAN / ED COURSE  Pertinent labs & imaging results that were available during my care of the patient were reviewed by me and considered in my medical decision making (see chart for details).   Patient presents emergency department for evaluation of abdominal pain with one episode of vomiting today.  He is a type I diabetic.  VBG shows no acidosis.  Remaining labs are pending.  Patient does have tenderness on exam.  I reviewed the CT imaging from May 2021 at [facility].  Plan for repeat scan today given tenderness on exam and reassess.  03:00 AM  CT imaging reviewed.  Clinically favor collapse segment of colon rather than colitis.  Patient is not focally tender in this area.  No evidence of urinary tract infection.  On reassessment patient is feeling "much  better" and tolerating oral intake.  Discussed GI follow-up along with PCP and ED return precautions.  Patient's potassium is slightly low.  Will replace over the next 5 days and encourage PCP follow-up for repeat labs. Also discussed THC use possibly contributing to abd pain and vomiting. Will try to cut back/stop.  ____________________________________________  FINAL CLINICAL IMPRESSION(S) / ED DIAGNOSES  Final diagnoses:  Generalized abdominal pain  Non-intractable vomiting with nausea,  unspecified vomiting type  Hypokalemia     MEDICATIONS GIVEN DURING THIS VISIT:  Medications  sodium chloride 0.9 % bolus 1,000 mL (0 mLs Intravenous Stopped 09/26/19 0228)  ondansetron (ZOFRAN) injection 4 mg (4 mg Intravenous Given 09/26/19 0107)  iohexol (OMNIPAQUE) 300 MG/ML solution 100 mL (100 mLs Intravenous Contrast Given 09/26/19 0136)     NEW OUTPATIENT MEDICATIONS STARTED DURING THIS VISIT:  New Prescriptions   DICYCLOMINE (BENTYL) 20 MG TABLET    Take 1 tablet (20 mg total) by mouth 3 (three) times daily as needed for spasms (cramping abdominal pain).   ONDANSETRON (ZOFRAN ODT) 4 MG DISINTEGRATING TABLET    Take 1 tablet (4 mg total) by mouth every 8 (eight) hours as needed.   POTASSIUM CHLORIDE SA (KLOR-CON) 20 MEQ TABLET    Take 2 tablets (40 mEq total) by mouth daily for 5 days.    Note:  This document was prepared using Dragon voice recognition software and may include unintentional dictation errors.  Alona Bene, MD, [facility] Emergency Medicine    Alley Neils, Arlyss Repress, MD 09/26/19 [phone]

## 2019-11-16 ENCOUNTER — Other Ambulatory Visit: Payer: Self-pay

## 2019-11-16 ENCOUNTER — Emergency Department (HOSPITAL_BASED_OUTPATIENT_CLINIC_OR_DEPARTMENT_OTHER)
Admission: EM | Admit: 2019-11-16 | Discharge: 2019-11-16 | Disposition: A | Discharge status: Home / Self Care | Payer: Medicaid Other | Attending: Emergency Medicine | Admitting: Emergency Medicine

## 2019-11-16 ENCOUNTER — Encounter (HOSPITAL_BASED_OUTPATIENT_CLINIC_OR_DEPARTMENT_OTHER): Payer: Self-pay | Admitting: Emergency Medicine

## 2019-11-16 DIAGNOSIS — E1110 Type 2 diabetes mellitus with ketoacidosis without coma: Secondary | ICD-10-CM | POA: Diagnosis not present

## 2019-11-16 DIAGNOSIS — R112 Nausea with vomiting, unspecified: Secondary | ICD-10-CM | POA: Diagnosis not present

## 2019-11-16 DIAGNOSIS — R1084 Generalized abdominal pain: Secondary | ICD-10-CM | POA: Insufficient documentation

## 2019-11-16 DIAGNOSIS — Z7984 Long term (current) use of oral hypoglycemic drugs: Secondary | ICD-10-CM | POA: Insufficient documentation

## 2019-11-16 DIAGNOSIS — R109 Unspecified abdominal pain: Secondary | ICD-10-CM | POA: Diagnosis present

## 2019-11-16 LAB — I-STAT VENOUS BLOOD GAS, ED
Acid-Base Excess: 3 mmol/L — ABNORMAL HIGH (ref 0.0–2.0)
Bicarbonate: 29.3 mmol/L — ABNORMAL HIGH (ref 20.0–28.0)
Calcium, Ion: 1.14 mmol/L — ABNORMAL LOW (ref 1.15–1.40)
HCT: 46 % (ref 39.0–52.0)
Hemoglobin: 15.6 g/dL (ref 13.0–17.0)
O2 Saturation: 76 %
Patient temperature: 98
Potassium: 4.2 mmol/L (ref 3.5–5.1)
Sodium: 137 mmol/L (ref 135–145)
TCO2: 31 mmol/L (ref 22–32)
pCO2, Ven: 47.3 mmHg (ref 44.0–60.0)
pH, Ven: 7.398 (ref 7.250–7.430)
pO2, Ven: 40 mmHg (ref 32.0–45.0)

## 2019-11-16 LAB — URINALYSIS, ROUTINE W REFLEX MICROSCOPIC
Bilirubin Urine: NEGATIVE
Glucose, UA: >=500 mg/dL — AB
Hgb urine dipstick: NEGATIVE
Ketones, ur: 15 mg/dL — AB
Leukocytes,Ua: NEGATIVE
Nitrite: NEGATIVE
Protein, ur: NEGATIVE mg/dL
Specific Gravity, Urine: 1.01 (ref 1.005–1.030)
pH: 7 (ref 5.0–8.0)

## 2019-11-16 LAB — CBC WITH DIFFERENTIAL/PLATELET
Abs Immature Granulocytes: 0.04 10*3/uL (ref 0.00–0.07)
Basophils Absolute: 0 10*3/uL (ref 0.0–0.1)
Basophils Relative: 0 %
Eosinophils Absolute: 0.4 10*3/uL (ref 0.0–0.5)
Eosinophils Relative: 4 %
HCT: 43.4 % (ref 39.0–52.0)
Hemoglobin: 14.9 g/dL (ref 13.0–17.0)
Immature Granulocytes: 0 %
Lymphocytes Relative: 13 %
Lymphs Abs: 1.5 10*3/uL (ref 0.7–4.0)
MCH: 28.9 pg (ref 26.0–34.0)
MCHC: 34.3 g/dL (ref 30.0–36.0)
MCV: 84.3 fL (ref 80.0–100.0)
Monocytes Absolute: 0.6 10*3/uL (ref 0.1–1.0)
Monocytes Relative: 5 %
Neutro Abs: 9.4 10*3/uL — ABNORMAL HIGH (ref 1.7–7.7)
Neutrophils Relative %: 78 %
Platelets: 326 10*3/uL (ref 150–400)
RBC: 5.15 MIL/uL (ref 4.22–5.81)
RDW: 12 % (ref 11.5–15.5)
WBC: 12 10*3/uL — ABNORMAL HIGH (ref 4.0–10.5)
nRBC: 0 % (ref 0.0–0.2)

## 2019-11-16 LAB — COMPREHENSIVE METABOLIC PANEL
ALT: 10 U/L (ref 0–44)
AST: 14 U/L — ABNORMAL LOW (ref 15–41)
Albumin: 3.9 g/dL (ref 3.5–5.0)
Alkaline Phosphatase: 58 U/L (ref 38–126)
Anion gap: 11 (ref 5–15)
BUN: 9 mg/dL (ref 6–20)
CO2: 28 mmol/L (ref 22–32)
Calcium: 8.9 mg/dL (ref 8.9–10.3)
Chloride: 96 mmol/L — ABNORMAL LOW (ref 98–111)
Creatinine, Ser: 0.9 mg/dL (ref 0.61–1.24)
GFR calc non Af Amer: >60 mL/min (ref >60)
Glucose, Bld: 288 mg/dL — ABNORMAL HIGH (ref 70–99)
Potassium: 4.2 mmol/L (ref 3.5–5.1)
Sodium: 135 mmol/L (ref 135–145)
Total Bilirubin: 0.8 mg/dL (ref 0.3–1.2)
Total Protein: 6.6 g/dL (ref 6.5–8.1)

## 2019-11-16 LAB — URINALYSIS, MICROSCOPIC (REFLEX)

## 2019-11-16 LAB — LIPASE, BLOOD: Lipase: 20 U/L (ref 11–51)

## 2019-11-16 MED ORDER — ALUM & MAG HYDROXIDE-SIMETH 200-200-20 MG/5ML PO SUSP
30.0000 mL | Freq: Once | ORAL | Status: AC
  Administered 2019-11-16: 30 mL via ORAL
  Filled 2019-11-16: qty 30

## 2019-11-16 MED ORDER — LIDOCAINE VISCOUS HCL 2 % MT SOLN
15.0000 mL | Freq: Once | OROMUCOSAL | Status: AC
  Administered 2019-11-16: 15 mL via ORAL
  Filled 2019-11-16: qty 15

## 2019-11-16 MED ORDER — METOCLOPRAMIDE HCL 5 MG/ML IJ SOLN
10.0000 mg | Freq: Once | INTRAMUSCULAR | Status: AC
  Administered 2019-11-16: 10 mg via INTRAVENOUS
  Filled 2019-11-16: qty 2

## 2019-11-16 MED ORDER — SODIUM CHLORIDE 0.9 % IV BOLUS
1000.0000 mL | Freq: Once | INTRAVENOUS | Status: AC
  Administered 2019-11-16: 1000 mL via INTRAVENOUS

## 2019-11-16 MED ORDER — FENTANYL CITRATE (PF) 100 MCG/2ML IJ SOLN
50.0000 ug | Freq: Once | INTRAMUSCULAR | Status: AC
  Administered 2019-11-16: 50 ug via INTRAVENOUS
  Filled 2019-11-16: qty 2

## 2019-11-16 NOTE — ED Triage Notes (Signed)
Abdominal pain and tenderness x3 hours. Nausea and vomiting.  Given Zofran 4mg  IV PTA by EMS.  Seems to have helped. Has been seen here for the same in the past.

## 2019-11-16 NOTE — ED Provider Notes (Signed)
[facility] EMERGENCY DEPARTMENT Provider Note   CSN: [id number] Arrival date & time: 11/16/19  1055     History Chief Complaint  Patient presents with  . Emesis    Logan Washington is a 19 y.o. male. Presents to ER with concern for abdominal pain nausea and vomiting. He reports history of type 1 diabetes, states that his sugars recently have been okay, no significant hyperglycemia. Symptoms started today, generalized pain, not in one particular area, unable to recall number of vomiting episodes, vomit has been nonbloody nonbilious. He reports this feels similar to prior episodes. Denies prior history of abdominal surgeries. No fevers, no diarrhea or constipation.  HPI     Past Medical History:  Diagnosis Date  . Diabetes mellitus without complication [facility])     Patient Active Problem List   Diagnosis Date Noted  . Influenza A 01/24/2018  . Abdominal pain 01/24/2018  . DKA, type 1 (HCC) 01/23/2018  . AKI (acute kidney injury) (HCC) 01/23/2018  . Abnormal LFTs 01/23/2018  . Chest pain 01/23/2018  . Diabetes mellitus without complication (HCC)   . Sports physical 10/25/2015    Past Surgical History:  Procedure Laterality Date  . TONSILLECTOMY         Family History  Problem Relation Age of Onset  . Sudden death Neg Hx   . Heart attack Neg Hx     Social History   Tobacco Use  . Smoking status: Never Smoker  . Smokeless tobacco: Never Used  Vaping Use  . Vaping Use: Never used  Substance Use Topics  . Alcohol use: Not Currently  . Drug use: Never    Home Medications Prior to Admission medications   Medication Sig Start Date End Date Taking? Authorizing Provider  B-D UF III MINI PEN NEEDLES 31G X 5 MM MISC FOR USE WITH INSULIN PENS 10/12/15   [provider]  dicyclomine (BENTYL) 20 MG tablet Take 1 tablet (20 mg total) by mouth 3 (three) times daily as needed for spasms (cramping abdominal pain). 09/26/19   Long, Arlyss Repress, MD  glucagon,  human recombinant, (GLUCAGEN DIAGNOSTIC) 1 MG injection Inject 1 mL into the muscle once as needed for low blood sugar. 08/30/15   [provider]  LANTUS SOLOSTAR 100 UNIT/ML Solostar Pen Inject 25 Units into the skin at bedtime. Patient taking differently: Inject 31 Units into the skin at bedtime.  01/25/18   Osvaldo Shipper, MD  NOVOLOG FLEXPEN 100 UNIT/ML FlexPen Inject 6-7 Units into the skin 3 (three) times daily with meals. Every 7 carbs =1 unit 10/09/15   [provider]  ondansetron (ZOFRAN ODT) 4 MG disintegrating tablet Take 1 tablet (4 mg total) by mouth every 8 (eight) hours as needed. 09/26/19   Long, Arlyss Repress, MD  potassium chloride SA (KLOR-CON) 20 MEQ tablet Take 2 tablets (40 mEq total) by mouth daily for 5 days. 09/26/19 10/01/19  Long, Arlyss Repress, MD    Allergies    Tilapia [fish allergy]  Review of Systems   Review of Systems  Constitutional: Negative for chills and fever.  HENT: Negative for ear pain and sore throat.   Eyes: Negative for pain and visual disturbance.  Respiratory: Negative for cough and shortness of breath.   Cardiovascular: Negative for chest pain and palpitations.  Gastrointestinal: Positive for abdominal pain, nausea and vomiting.  Genitourinary: Negative for dysuria and hematuria.  Musculoskeletal: Negative for arthralgias and back pain.  Skin: Negative for color change and rash.  Neurological: Negative  for seizures and syncope.  All other systems reviewed and are negative.   Physical Exam Updated Vital Signs BP 139/80   Pulse 71   Temp 98.2 F (36.8 C) (Oral)   Resp 17   Ht 5\' 10"  (1.778 m)   Wt 65.8 kg   SpO2 100%   BMI 20.81 kg/m   Physical Exam Vitals and nursing note reviewed.  Constitutional:      Appearance: He is well-developed.  HENT:     Head: Normocephalic and atraumatic.  Eyes:     Conjunctiva/sclera: Conjunctivae normal.  Cardiovascular:     Rate and Rhythm: Normal rate and regular rhythm.     Heart  sounds: No murmur heard.   Pulmonary:     Effort: Pulmonary effort is normal. No respiratory distress.     Breath sounds: Normal breath sounds.  Abdominal:     Palpations: Abdomen is soft.     Tenderness: There is no abdominal tenderness.     Comments: Generalized tenderness to palpation, no rebound or guarding, no tenderness at McBurney's point  Musculoskeletal:        General: No deformity or signs of injury.     Cervical back: Neck supple.  Skin:    General: Skin is warm and dry.  Neurological:     General: No focal deficit present.     Mental Status: He is alert.  Psychiatric:        Mood and Affect: Mood normal.        Behavior: Behavior normal.        Thought Content: Thought content normal.     ED Results / Procedures / Treatments   Labs (all labs ordered are listed, but only abnormal results are displayed) Labs Reviewed  CBC WITH DIFFERENTIAL/PLATELET - Abnormal; Notable for the following components:      Result Value   WBC 12.0 (*)    Neutro Abs 9.4 (*)    All other components within normal limits  COMPREHENSIVE METABOLIC PANEL - Abnormal; Notable for the following components:   Chloride 96 (*)    Glucose, Bld 288 (*)    AST 14 (*)    All other components within normal limits  URINALYSIS, ROUTINE W REFLEX MICROSCOPIC - Abnormal; Notable for the following components:   Glucose, UA >=500 (*)    Ketones, ur 15 (*)    All other components within normal limits  URINALYSIS, MICROSCOPIC (REFLEX) - Abnormal; Notable for the following components:   Bacteria, UA FEW (*)    All other components within normal limits  I-STAT VENOUS BLOOD GAS, ED - Abnormal; Notable for the following components:   Bicarbonate 29.3 (*)    Acid-Base Excess 3.0 (*)    Calcium, Ion 1.14 (*)    All other components within normal limits  LIPASE, BLOOD    EKG None  Radiology No results found.  Procedures Procedures (including critical care time)  Medications Ordered in  ED Medications  metoCLOPramide (REGLAN) injection 10 mg (10 mg Intravenous Given 11/16/19 1145)  fentaNYL (SUBLIMAZE) injection 50 mcg (50 mcg Intravenous Given 11/16/19 1144)  sodium chloride 0.9 % bolus 1,000 mL (0 mLs Intravenous Stopped 11/16/19 1247)  alum & mag hydroxide-simeth (MAALOX/MYLANTA) 200-200-20 MG/5ML suspension 30 mL (30 mLs Oral Given 11/16/19 1258)    And  lidocaine (XYLOCAINE) 2 % viscous mouth solution 15 mL (15 mLs Oral Given 11/16/19 1258)    ED Course  I have reviewed the triage vital signs and the nursing notes.  Pertinent  labs & imaging results that were available during my care of the patient were reviewed by me and considered in my medical decision making (see chart for details).  Clinical Course as of Nov 15 1524  Tue Nov 16, 2019  1300 Symptoms are almost resolved, will provide GI cocktail, p.o. challenge   [RD]    Clinical Course User Index [RD] Milagros Loll, MD   MDM Rules/Calculators/A&P                         19 year old male with type 1 diabetes presents to ER with nausea, vomiting, abdominal pain. On initial exam, patient seemed somewhat uncomfortable, vital signs were stable and he was afebrile, noted generalized tenderness on his abdominal exam. He was provided aggressive symptomatic control and his symptoms greatly improved. Based on labs, no DKA, electrolyte stable, borderline leukocytosis. No urinary symptoms, doubt UTI. On reassessment, his repeat abdominal exam was benign, no focal tenderness was appreciated. His symptoms completely resolved and he was tolerating p.o. without difficulty. Suspect gastritis versus gastroparesis given diabetes history. I believe he is appropriate for discharge and outpatient management this time. I provided strict return precautions should he have recurrence of the symptoms, or have persistent abdominal pain over the next day. Instructed to follow-up with his primary doctor. Continue his regular insulin  regimen.   After the discussed management above, the patient was determined to be safe for discharge.  The patient was in agreement with this plan and all questions regarding their care were answered.  ED return precautions were discussed and the patient will return to the ED with any significant worsening of condition.    Final Clinical Impression(s) / ED Diagnoses Final diagnoses:  Generalized abdominal pain    Rx / DC Orders ED Discharge Orders    None       Milagros Loll, MD 11/16/19 1526

## 2019-11-16 NOTE — Discharge Instructions (Signed)
Follow-up with your primary doctor. Take your Zofran as needed for nausea. Continue your insulin as your regular regimen. If you develop worsening abdominal pain, vomiting or other new concerning symptom, return to ER for reassessment.

## 2019-11-16 NOTE — Progress Notes (Signed)
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  Past tonsillectomy. He presents to our office today for further evaluation for N/V, hematemesis and abdominal pain. He presented to [facility] ED 11/16/2019 with abdominal pain, nausea and vomiting x 1 day.  Yesterday, he developed severe generalized abdominal pain. He vomited partially digested food. His central abdominal pain worsened so he called 911. He reported vomiting up "globs of burgundy blood" x 2 when the EMS team was present. However, the ED physician reports patient had nonbloody nonbilious emesis.  Labs in the ED showed a WBC of  12.0. Glu 288. ALT 14. Lipase 20. Urine glucose > 500. Urine ketones 15. He received Reglan, Fentanyl, GI cocktail and IV fluids. His symptoms N/V abated and his abdominal pain improved. He was tolerating clear liquids without difficulty. He was diagnosed with gastritis verses gastroparesis and he was discharged home with the instructions to schedule a GI consult. He vomited up yogurt and popsicles x1 yesterday when he was back home.  Currently, he continues to feel nauseous and describes having moderate central abdominal pain around his bellybutton which extends to the central lower abdomen.  He stated his current pain is much worse and different than the abdominal pain he experienced when he went to the emergency room 09/26/2019.  At that time, he reported having generalized abdominal pain for 5 days with one episode of vomiting.  An abdominal/pelvic CT scan at that time showed wall thickening/mild colitis type changes versus collapsed appearance of the ascending colon and distal transverse colon.  Possible cystitis was also noted.  He denies having any diarrhea or bloody stools.  He typically passes a small formed brown stool  daily.  No family history of inflammatory bowel disease, gastric or colorectal cancer.  In review of his epic records, he also presented to the ED 08/05/2019 with right flank pain and his glucose level was 533 at that time.  He presented to [facility] ED 09/09/2019 with flank pain, his glucose level was 214 at that time and urine glucose was greater than 500.  He was treated for musculoskeletal pain. No heartburn of dysphagia. Infrequent NSAID use. He smokes marijuana on a daily basis.  He previously smoked marijuana 6 times daily and he recently reduced his marijuana use to 2-3 times daily.  He denies he denies alcohol use.  No fever, sweats or chills.  No weight loss.   Abdominal/pelvic CT 09/26/2019: 1. Motion degradation. 2. Wall thickening/mild colitis type changes versus collapsed appearance of the ascending colon and distal transverse colon. 3. Thick-walled appearance of the urinary bladder, question cystitis. Suggest correlation with urinalysis   CBC Latest Ref Rng & Units 11/16/2019 11/16/2019 09/26/2019  WBC 4.0 - 10.5 K/uL - 12.0(H) -  Hemoglobin 13.0 - 17.0 g/dL 83.4 19.6 17.3(H)  Hematocrit 39 - 52 % 46.0 43.4 51.0  Platelets 150 - 400 K/uL - 326 -    CMP Latest Ref Rng & Units 11/16/2019 11/16/2019 09/26/2019  Glucose 70 - 99 mg/dL - 222(L) -  BUN 6 - 20 mg/dL - 9 -  Creatinine 7.98 - 1.24 mg/dL - 9.21 -  Sodium 194 - 145 mmol/L 137 135 138  Potassium 3.5 - 5.1 mmol/L 4.2 4.2 3.2(L)  Chloride 98 -  111 mmol/L - 96(L) -  CO2 22 - 32 mmol/L - 28 -  Calcium 8.9 - 10.3 mg/dL - 8.9 -  Total Protein 6.5 - 8.1 g/dL - 6.6 -  Total Bilirubin 0.3 - 1.2 mg/dL - 0.8 -  Alkaline Phos 38 - 126 U/L - 58 -  AST 15 - 41 U/L - 14(L) -  ALT 0 - 44 U/L - 10 -   Past Medical History:  Diagnosis Date   Diabetes mellitus without complication (HCC)    Past Surgical History:  Procedure Laterality Date   TONSILLECTOMY      Social History: He is single.  He has 1 son.  Past tobacco  use.  No alcohol use.  He previously smoked marijuana 6 times daily and has recently reduced to smoking marijuana 2-3 times daily.  No other drug use.  Family History: No family history of esophageal, gastric, colon, liver or pancreatic cancer.   Allergies  Allergen Reactions   Tilapia [Fish Allergy] Swelling    Mouth       Outpatient Encounter Medications as of 11/17/2019  Medication Sig   B-D UF III MINI PEN NEEDLES 31G X 5 MM MISC FOR USE WITH INSULIN PENS   dicyclomine (BENTYL) 20 MG tablet Take 1 tablet (20 mg total) by mouth 3 (three) times daily as needed for spasms (cramping abdominal pain).   glucagon, human recombinant, (GLUCAGEN DIAGNOSTIC) 1 MG injection Inject 1 mL into the muscle once as needed for low blood sugar.   LANTUS SOLOSTAR 100 UNIT/ML Solostar Pen Inject 25 Units into the skin at bedtime. (Patient taking differently: Inject 31 Units into the skin at bedtime. )   NOVOLOG FLEXPEN 100 UNIT/ML FlexPen Inject 6-7 Units into the skin 3 (three) times daily with meals. Every 7 carbs =1 unit   ondansetron (ZOFRAN ODT) 4 MG disintegrating tablet Take 1 tablet (4 mg total) by mouth every 8 (eight) hours as needed.   potassium chloride SA (KLOR-CON) 20 MEQ tablet Take 2 tablets (40 mEq total) by mouth daily for 5 days.   No facility-administered encounter medications on file as of 11/17/2019.     REVIEW OF SYSTEMS:  Gen: Denies fever, sweats or chills. No weight loss.  CV: Denies chest pain, palpitations or edema. Resp: Denies cough, shortness of breath of hemoptysis.  GI: See HPI.  GU : Denies urinary burning, blood in urine, increased urinary frequency or incontinence. MS: Denies joint pain, muscles aches or weakness. Derm: Denies rash, itchiness, skin lesions or unhealing ulcers. Psych: Denies depression or anxiety.  Heme: Denies bruising, bleeding. Neuro:  Denies headaches, dizziness or paresthesias. Endo:  Type I DM on insulin.   PHYSICAL EXAM: BP 110/80     Pulse 81    Ht 5' 10.5" (1.791 m)    Wt 158 lb (71.7 kg)    BMI 22.35 kg/m  General: Well developed  19 year old male in no acute distress. Head: Normocephalic and atraumatic. Eyes:  Sclerae non-icteric, conjunctive pink. Ears: Normal auditory acuity. Mouth: Dentition intact. No ulcers or lesions.  Neck: Supple, no lymphadenopathy or thyromegaly.  Lungs: Clear bilaterally to auscultation without wheezes, crackles or rhonchi. Heart: Regular rate and rhythm. No murmur, rub or gallop appreciated.  Abdomen: Soft, non distended.Moderate generalized abdominal pain focused to the central periumbilical area and central lower abdomen. No rebound or guarding. No masses. No hepatosplenomegaly. Normoactive bowel sounds x 4 quadrants.  Rectal: Deferred.  Musculoskeletal: Symmetrical with no gross deformities. Skin: Warm and dry.  No rash or lesions on visible extremities. Extremities: No edema. Neurological: Alert oriented x 4, no focal deficits.  Psychological:  Alert and cooperative. Normal mood and affect.  ASSESSMENT AND PLAN:  45. 19 year old male with N/V with reports of hematemesis on 11/16/2019 -Omeprazole 40mg  one po QD -Ondansetron PRN as prescribed by the ED -EGD to rule out GERD/PUD/Mallory Weiss tear. EGD benefits and risks discussed including risk with sedation, risk of bleeding, perforation and infection  -If EGD negative, will schedule a gastric empty study  -I discussed chronic marijuana use may be triggering cyclic N/V, advised patient to stop marijuana use  -Patient to call our office if his symptoms worsen   2. Generalized abdominal pain, recurrent.  CTAP 09/2019 showed possible colitis to the ascending and distal transverse colon. -Repeat CTAP with oral and IV contrast -Discussed scheduling a colonoscopy if CTAP shows any evidence of colitis  -Dicyclomine 20mg  po tid PRN abdominal pain, patient has RX         CC:  No ref. provider found

## 2019-11-17 ENCOUNTER — Ambulatory Visit (INDEPENDENT_AMBULATORY_CARE_PROVIDER_SITE_OTHER): Payer: Medicaid Other | Admitting: Nurse Practitioner

## 2019-11-17 ENCOUNTER — Encounter: Payer: Self-pay | Admitting: Nurse Practitioner

## 2019-11-17 VITALS — BP 110/80 | HR 81 | Ht 70.5 in | Wt 158.0 lb

## 2019-11-17 DIAGNOSIS — R112 Nausea with vomiting, unspecified: Secondary | ICD-10-CM | POA: Diagnosis not present

## 2019-11-17 DIAGNOSIS — R042 Hemoptysis: Secondary | ICD-10-CM | POA: Diagnosis not present

## 2019-11-17 DIAGNOSIS — E119 Type 2 diabetes mellitus without complications: Secondary | ICD-10-CM

## 2019-11-17 DIAGNOSIS — R1084 Generalized abdominal pain: Secondary | ICD-10-CM

## 2019-11-17 MED ORDER — ONDANSETRON HCL 4 MG PO TABS
4.0000 mg | ORAL_TABLET | Freq: Three times a day (TID) | ORAL | 1 refills | Status: DC | PRN
Start: 2019-11-17 — End: 2021-10-10

## 2019-11-17 MED ORDER — OMEPRAZOLE 40 MG PO CPDR
40.0000 mg | DELAYED_RELEASE_CAPSULE | Freq: Every day | ORAL | 1 refills | Status: DC
Start: 2019-11-17 — End: 2019-12-10

## 2019-11-17 MED ORDER — DICYCLOMINE HCL 20 MG PO TABS
20.0000 mg | ORAL_TABLET | Freq: Three times a day (TID) | ORAL | 0 refills | Status: DC
Start: 2019-11-17 — End: 2023-06-12

## 2019-11-17 MED ORDER — DICYCLOMINE HCL 10 MG PO CAPS
10.0000 mg | ORAL_CAPSULE | Freq: Three times a day (TID) | ORAL | 1 refills | Status: DC
Start: 2019-11-17 — End: 2019-11-17

## 2019-11-17 MED ORDER — DICYCLOMINE HCL 20 MG PO TABS: 20.0000 mg | ORAL_TABLET | Freq: Three times a day (TID) | ORAL | 0 refills | Status: DC

## 2019-11-17 NOTE — Patient Instructions (Signed)
   _  x_   INSULIN (LONG ACTING) MEDICATION INSTRUCTIONS (Lantus, NPH, 70/30, Humulin, Novolin-N, Levemir, Toujeo, Tresiba )   The day before your procedure:  Take  your regular evening dose    The day of your procedure:  Do not take your morning dose   _ x _   INSULIN (SHORT ACTING) MEDICATION INSTRUCTIONS (Regular, Humulog, Novolog, Apidra, Novolin, Humulin)   The day before your procedure:  Do not take your evening dose   The day of your procedure:  Do not take your morning dose     You have been scheduled for an endoscopy. Please follow written instructions given to you at your visit today. If you use inhalers (even only as needed), please bring them with you on the day of your procedure.   We have sent the following medications to your pharmacy for you to pick up at your convenience: Zofran, Omeprazole,Dicyclomine    START: Miralax 1 capful dissolved in at at least 8 ounces of water at bedtime nightly.   Call office is symptoms worsen.   You have been scheduled for a CT scan of the abdomen and pelvis at [facility], 1st floor Radiology. You are scheduled on 11/29/19  at 8:00am. You should arrive 15 minutes prior to your appointment time for registration.   The solution may taste better if refrigerated, but do NOT add ice or any other liquid to this solution. Shake well before drinking.   Please follow the written instructions below on the day of your exam:   1) Do not eat anything after 4:00am (4 hours prior to your test)   2) Drink 1 bottle of contrast @ 6:00am (2 hours prior to your exam)  Remember to shake well before drinking and do NOT pour over ice.     Drink 1 bottle of contrast @ 7:00am (1 hour prior to your exam)   You may take any medications as prescribed with a small amount of water, if necessary. If you take any of the following medications: METFORMIN, GLUCOPHAGE, GLUCOVANCE, AVANDAMET, RIOMET, FORTAMET, [place] MET, JANUMET, GLUMETZA or  METAGLIP, you MAY be asked to HOLD this medication 48 hours AFTER the exam.   The purpose of you drinking the oral contrast is to aid in the visualization of your intestinal tract. The contrast solution may cause some diarrhea. Depending on your individual set of symptoms, you may also receive an intravenous injection of x-ray contrast/dye. Plan on being at [facility] for 45 minutes or longer, depending on the type of exam you are having performed.   If you have any questions regarding your exam or if you need to reschedule, you may call [facility] at [phone] between the hours of 8:00 am and 5:00 pm, Monday-Friday.   Thank you for choosing me and [facility].  Cook

## 2019-11-29 ENCOUNTER — Ambulatory Visit (HOSPITAL_COMMUNITY): Payer: Medicaid Other | Attending: Nurse Practitioner

## 2019-12-02 NOTE — Progress Notes (Signed)
Reviewed and agree with documentation and assessment and plan. K. Veena Maven Rosander , MD   

## 2019-12-10 ENCOUNTER — Other Ambulatory Visit: Payer: Self-pay | Admitting: Nurse Practitioner

## 2019-12-17 ENCOUNTER — Encounter: Payer: Medicaid Other | Admitting: Gastroenterology

## 2019-12-20 ENCOUNTER — Other Ambulatory Visit: Payer: Self-pay | Admitting: Nurse Practitioner

## 2019-12-24 ENCOUNTER — Other Ambulatory Visit: Payer: Self-pay | Admitting: Gastroenterology

## 2019-12-24 LAB — SARS CORONAVIRUS 2 (TAT 6-24 HRS): SARS Coronavirus 2: NEGATIVE

## 2019-12-28 ENCOUNTER — Ambulatory Visit (AMBULATORY_SURGERY_CENTER): Payer: Medicaid Other | Admitting: Gastroenterology

## 2019-12-28 ENCOUNTER — Other Ambulatory Visit: Payer: Self-pay

## 2019-12-28 ENCOUNTER — Encounter: Payer: Self-pay | Admitting: Gastroenterology

## 2019-12-28 ENCOUNTER — Telehealth: Payer: Self-pay

## 2019-12-28 VITALS — BP 113/71 | HR 53 | Temp 97.1°F | Resp 19 | Ht 70.0 in | Wt 158.0 lb

## 2019-12-28 DIAGNOSIS — K2100 Gastro-esophageal reflux disease with esophagitis, without bleeding: Secondary | ICD-10-CM | POA: Diagnosis not present

## 2019-12-28 DIAGNOSIS — R1084 Generalized abdominal pain: Secondary | ICD-10-CM

## 2019-12-28 DIAGNOSIS — R112 Nausea with vomiting, unspecified: Secondary | ICD-10-CM

## 2019-12-28 DIAGNOSIS — R1013 Epigastric pain: Secondary | ICD-10-CM

## 2019-12-28 DIAGNOSIS — K2970 Gastritis, unspecified, without bleeding: Secondary | ICD-10-CM

## 2019-12-28 MED ORDER — OMEPRAZOLE 40 MG PO CPDR: 40.0000 mg | DELAYED_RELEASE_CAPSULE | Freq: Every day | ORAL | 11 refills | Status: DC

## 2019-12-28 MED ORDER — SODIUM CHLORIDE 0.9 % IV SOLN: 500.0000 mL | Freq: Once | INTRAVENOUS | Status: DC

## 2019-12-28 MED FILL — OMEPRAZOLE 40 MG CPDR: 40 | 30 days supply | Qty: 30 | Fill #0

## 2019-12-28 NOTE — Op Note (Signed)
[facility] Patient Name: Logan Washington Procedure Date: 12/28/2019 11:38 AM MRN: [id number] Endoscopist: Napoleon Form , MD Age: 19 Referring MD:  Date of Birth: Jul 11, 2000 Gender: Male Account #: [id number] Procedure:                Upper GI endoscopy Indications:              Persistent vomiting of unknown cause, Epigastric                            abdominal pain, Generalized abdominal pain Medicines:                Monitored Anesthesia Care Procedure:                Pre-Anesthesia Assessment:                           - Prior to the procedure, a History and Physical                            was performed, and patient medications and                            allergies were reviewed. The patient's tolerance of                            previous anesthesia was also reviewed. The risks                            and benefits of the procedure and the sedation                            options and risks were discussed with the patient.                            All questions were answered, and informed consent                            was obtained. Prior Anticoagulants: The patient has                            taken no previous anticoagulant or antiplatelet                            agents. ASA Grade Assessment: II - A patient with                            mild systemic disease. After reviewing the risks                            and benefits, the patient was deemed in                            satisfactory condition to undergo the procedure.  After obtaining informed consent, the endoscope was                            passed under direct vision. Throughout the                            procedure, the patient's blood pressure, pulse, and                            oxygen saturations were monitored continuously. The                            Endoscope was introduced through the mouth, and                            advanced  to the second part of duodenum. The upper                            GI endoscopy was accomplished without difficulty.                            The patient tolerated the procedure well. Scope In: Scope Out: Findings:                 LA Grade A (one or more mucosal breaks less than 5                            mm, not extending between tops of 2 mucosal folds)                            esophagitis was found 36 to 38 cm from the incisors.                           The gastroesophageal flap valve was visualized                            endoscopically and classified as Hill Grade III                            (minimal fold, loose to endoscope, hiatal hernia                            likely).                           Patchy mild inflammation characterized by                            congestion (edema) and erythema was found in the                            entire examined stomach. Biopsies were taken with a  cold forceps for Helicobacter pylori testing.                           The examined duodenum was normal. Complications:            No immediate complications. Estimated Blood Loss:     Estimated blood loss was minimal. Impression:               - LA Grade A reflux esophagitis.                           - Gastroesophageal flap valve classified as Hill                            Grade III (minimal fold, loose to endoscope, hiatal                            hernia likely).                           - Gastritis. Biopsied.                           - Normal examined duodenum. Recommendation:           - Patient has a contact number available for                            emergencies. The signs and symptoms of potential                            delayed complications were discussed with the                            patient. Return to normal activities tomorrow.                            Written discharge instructions were provided to the                             patient.                           - Resume previous diet.                           - Continue present medications.                           - Await pathology results.                           - Use Prilosec (omeprazole) 40 mg PO daily.                           - Follow an antireflux regimen. Napoleon Form, MD 12/28/2019 11:56:13 AM This report has been signed electronically.

## 2019-12-28 NOTE — Progress Notes (Signed)
Called to room to assist during endoscopic procedure.  Patient ID and intended procedure confirmed with present staff. Received instructions for my participation in the procedure from the performing physician.  

## 2019-12-28 NOTE — Telephone Encounter (Signed)
Pt is scheduled to see Dr. Lavon Paganini on 12/28/19 at 1130. Pt had questions about his prep. He said he was given Barium Sulfate Oral Suspension. Explained to pt that he did not need to drink that for his procedure today, and it was for a CT that was ordered for him. Advised patient to refrain from drinking any fluids for the rest of the morning for upcoming procedure. Pt verbalized understanding.

## 2019-12-28 NOTE — Progress Notes (Signed)
AR - Check-in CW- VS  

## 2019-12-28 NOTE — Patient Instructions (Addendum)
HANDOUTS PROVIDED ON: gastritis, Gastroesophageal Reflux diesease and esophagitis  The biopsies taken today have been sent for pathology.  The results can take 1-3 weeks to receive.    Continue to take Prilosec 40 mg by mouth daily.  Best to take this on an empty stomach.  20-30 minutes before breakfast.  You may resume your previous diet and medication schedule.  Thank you for allowing [place] to care for you today!!!      YOU HAD AN ENDOSCOPIC PROCEDURE TODAY AT THE [facility]:   Refer to the procedure report that was given to you for any specific questions about what was found during the examination.  If the procedure report does not answer your questions, please call your gastroenterologist to clarify.  If you requested that your care partner not be given the details of your procedure findings, then the procedure report has been included in a sealed envelope for you to review at your convenience later.  YOU SHOULD EXPECT: Some feelings of bloating in the abdomen. Passage of more gas than usual.  Walking can help get rid of the air that was put into your GI tract during the procedure and reduce the bloating.  Please Note:  You might notice some irritation and congestion in your nose or some drainage.  This is from the oxygen used during your procedure.  There is no need for concern and it should clear up in a day or so.  SYMPTOMS TO REPORT IMMEDIATELY:   Following upper endoscopy (EGD)  Vomiting of blood or coffee ground material  New chest pain or pain under the shoulder blades  Painful or persistently difficult swallowing  New shortness of breath  Fever of 100F or higher  Black, tarry-looking stools  For urgent or emergent issues, a gastroenterologist can be reached at any hour by calling [phone]. Do not use MyChart messaging for urgent concerns.    DIET:  We do recommend a small meal at first, but then you may proceed to your regular diet.  Drink plenty of  fluids but you should avoid alcoholic beverages for 24 hours.  ACTIVITY:  You should plan to take it easy for the rest of today and you should NOT DRIVE or use heavy machinery until tomorrow (because of the sedation medicines used during the test).    FOLLOW UP: Our staff will call the number listed on your records 48-72 hours following your procedure to check on you and address any questions or concerns that you may have regarding the information given to you following your procedure. If we do not reach you, we will leave a message.  We will attempt to reach you two times.  During this call, we will ask if you have developed any symptoms of COVID 19. If you develop any symptoms (ie: fever, flu-like symptoms, shortness of breath, cough etc.) before then, please call [phone].  If you test positive for Covid 19 in the 2 weeks post procedure, please call and report this information to [place].    If any biopsies were taken you will be contacted by phone or by letter within the next 1-3 weeks.  Please call us at [phone] if you have not heard about the biopsies in 3 weeks.    SIGNATURES/CONFIDENTIALITY: You and/or your care partner have signed paperwork which will be entered into your electronic medical record.  These signatures attest to the fact that that the information above on your After Visit Summary has been reviewed and  is understood.  Full responsibility of the confidentiality of this discharge information lies with you and/or your care-partner.

## 2019-12-28 NOTE — Progress Notes (Signed)
1144 Robinul 0.1 mg IV given due large amount of secretions upon assessment.  MD made aware, vss

## 2019-12-28 NOTE — Progress Notes (Signed)
Report given to PACU, vss 

## 2019-12-28 NOTE — Progress Notes (Signed)
Pt requested we send in another rx to [facility] in [facility] for Omeprazole 40 mg #30 daily best to take on an empty stomach, 20-30 minutes before breakfast.  Refill x 11.  Pt stayed longer in the recovery room due to difficult time waking up.  Pt was alert and oriented x3 before discharge. maw

## 2019-12-30 ENCOUNTER — Telehealth: Payer: Self-pay | Admitting: *Deleted

## 2019-12-30 NOTE — Telephone Encounter (Signed)
°  Follow up Call-  Call back number 12/28/2019  Post procedure Call Back phone  # #[phone] cell  Permission to leave phone message Yes  Some recent data might be hidden     Patient questions:  Do you have a fever, pain , or abdominal swelling? No. Pain Score  0 *  Have you tolerated food without any problems? Yes.    Have you been able to return to your normal activities? Yes.    Do you have any questions about your discharge instructions: Diet   No. Medications  No. Follow up visit  No.  Do you have questions or concerns about your Care? No.  Actions: * If pain score is 4 or above: No action needed, pain <4.  1. Have you developed a fever since your procedure? no  2.   Have you had an respiratory symptoms (SOB or cough) since your procedure? no  3.   Have you tested positive for COVID 19 since your procedure no  4.   Have you had any family members/close contacts diagnosed with the COVID 19 since your procedure?  no   If yes to any of these questions please route to Laverna Peace, RN and Karlton Lemon, RN

## 2020-01-17 ENCOUNTER — Encounter: Payer: Self-pay | Admitting: Gastroenterology

## 2020-05-21 ENCOUNTER — Other Ambulatory Visit: Payer: Self-pay

## 2020-05-21 ENCOUNTER — Encounter (HOSPITAL_BASED_OUTPATIENT_CLINIC_OR_DEPARTMENT_OTHER): Payer: Self-pay | Admitting: *Deleted

## 2020-05-21 ENCOUNTER — Emergency Department (HOSPITAL_BASED_OUTPATIENT_CLINIC_OR_DEPARTMENT_OTHER)
Admission: EM | Admit: 2020-05-21 | Discharge: 2020-05-21 | Disposition: A | Discharge status: Home / Self Care | Payer: Medicaid Other | Attending: Emergency Medicine | Admitting: Emergency Medicine

## 2020-05-21 DIAGNOSIS — E1010 Type 1 diabetes mellitus with ketoacidosis without coma: Secondary | ICD-10-CM | POA: Diagnosis not present

## 2020-05-21 DIAGNOSIS — R300 Dysuria: Secondary | ICD-10-CM | POA: Insufficient documentation

## 2020-05-21 DIAGNOSIS — Z794 Long term (current) use of insulin: Secondary | ICD-10-CM | POA: Diagnosis not present

## 2020-05-21 DIAGNOSIS — N342 Other urethritis: Secondary | ICD-10-CM

## 2020-05-21 LAB — URINALYSIS, MICROSCOPIC (REFLEX): RBC / HPF: >50 RBC/hpf (ref 0–5)

## 2020-05-21 LAB — URINALYSIS, ROUTINE W REFLEX MICROSCOPIC
Glucose, UA: >=500 mg/dL — AB
Ketones, ur: NEGATIVE mg/dL
Leukocytes,Ua: NEGATIVE
Nitrite: NEGATIVE
Protein, ur: 100 mg/dL — AB
Specific Gravity, Urine: 1.02 (ref 1.005–1.030)
pH: 6.5 (ref 5.0–8.0)

## 2020-05-21 LAB — CBG MONITORING, ED: Glucose-Capillary: 193 mg/dL — ABNORMAL HIGH (ref 70–99)

## 2020-05-21 MED ORDER — CEFTRIAXONE SODIUM 500 MG IJ SOLR
500.0000 mg | Freq: Once | INTRAMUSCULAR | Status: AC
  Administered 2020-05-21: 500 mg via INTRAMUSCULAR
  Filled 2020-05-21: qty 500

## 2020-05-21 MED ORDER — DOXYCYCLINE HYCLATE 100 MG PO CAPS: 100.0000 mg | ORAL_CAPSULE | Freq: Two times a day (BID) | ORAL | 0 refills | Status: DC

## 2020-05-21 MED ORDER — LIDOCAINE HCL (PF) 1 % IJ SOLN
1.0000 mL | Freq: Once | INTRAMUSCULAR | Status: AC
  Administered 2020-05-21: 1 mL
  Filled 2020-05-21: qty 5

## 2020-05-21 NOTE — ED Notes (Signed)
AMA process explained and MSE waiver signed by patient. 

## 2020-05-21 NOTE — Discharge Instructions (Addendum)
Please do not have any unprotected sexual intercourse for the next 2 weeks.  Make sure that you complete the entire week of medication. Contact a health care provider if: Your symptoms have not improved after 3 days. Your symptoms get worse. You have eye redness or pain. You develop abdominal pain or pelvic pain (in females). You develop joint pain. You have a fever. Get help right away if: You have severe pain in the belly, back, or side. You vomit repeatedly.

## 2020-05-21 NOTE — ED Triage Notes (Signed)
Pt reports dysuria and frequency x 4-5 days. Hx of type 1 diabetes

## 2020-05-21 NOTE — ED Provider Notes (Signed)
[facility] EMERGENCY DEPARTMENT Provider Note   CSN: [id number] Arrival date & time: 05/21/20  1345     History Chief Complaint  Patient presents with  . Dysuria    Weston Kallman is a 20 y.o. male urinary issues. He has sxs for 1 week. Difficulty starting stream and dribbling afterwards with dysuria at the end of urination. He denies discharge, testicle pain, rashes. He is sexually active and does not use protections but states that he and his last partner got tested before intercourse. He has suprapubic fullness and discomfort. No flank pain. No fever, nausea, or vomiting.  HPI     Past Medical History:  Diagnosis Date  . Diabetes mellitus without complication [facility])     Patient Active Problem List   Diagnosis Date Noted  . Influenza A 01/24/2018  . Abdominal pain 01/24/2018  . DKA, type 1 (HCC) 01/23/2018  . AKI (acute kidney injury) (HCC) 01/23/2018  . Abnormal LFTs 01/23/2018  . Chest pain 01/23/2018  . Diabetes mellitus without complication (HCC)   . Sports physical 10/25/2015    Past Surgical History:  Procedure Laterality Date  . TONSILLECTOMY         Family History  Problem Relation Age of Onset  . Breast cancer Mother   . Colon cancer Maternal Grandmother   . Sudden death Neg Hx   . Heart attack Neg Hx   . Stomach cancer Neg Hx   . Pancreatic cancer Neg Hx   . Esophageal cancer Neg Hx   . Rectal cancer Neg Hx     Social History   Tobacco Use  . Smoking status: Never Smoker  . Smokeless tobacco: Never Used  Vaping Use  . Vaping Use: Never used  Substance Use Topics  . Alcohol use: Not Currently  . Drug use: Yes    Types: Marijuana    Comment: last smoked 12-27-19    Home Medications Prior to Admission medications   Medication Sig Start Date End Date Taking? Authorizing Provider  B-D UF III MINI PEN NEEDLES 31G X 5 MM MISC FOR USE WITH INSULIN PENS 10/12/15   [provider]  dicyclomine (BENTYL) 20 MG tablet Take 1  tablet (20 mg total) by mouth 4 (four) times daily -  before meals and at bedtime. 11/17/19   Arnaldo Natal, NP  glucagon, human recombinant, (GLUCAGEN DIAGNOSTIC) 1 MG injection Inject 1 mL into the muscle once as needed for low blood sugar. Patient not taking: Reported on 12/28/2019 08/30/15   [provider]  LANTUS SOLOSTAR 100 UNIT/ML Solostar Pen Inject 25 Units into the skin at bedtime. Patient taking differently: Inject 31 Units into the skin at bedtime.  01/25/18   Osvaldo Shipper, MD  NOVOLOG FLEXPEN 100 UNIT/ML FlexPen Inject 6-7 Units into the skin 3 (three) times daily with meals. Every 7 carbs =1 unit 10/09/15   [provider]  omeprazole (PRILOSEC) 40 MG capsule Take 1 capsule (40 mg total) by mouth daily. Best to take on an empty stomach.  20-30 minutes before breakfast. 12/28/19   Nandigam, Eleonore Chiquito, MD  ondansetron (ZOFRAN ODT) 4 MG disintegrating tablet Take 1 tablet (4 mg total) by mouth every 8 (eight) hours as needed. 09/26/19   Long, Arlyss Repress, MD  ondansetron (ZOFRAN) 4 MG tablet Take 1 tablet (4 mg total) by mouth every 8 (eight) hours as needed for nausea or vomiting. 11/17/19   Arnaldo Natal, NP    Allergies    Tilapia Bethena Roys  allergy]  Review of Systems   Review of Systems  Constitutional: Negative for chills and fever.  Genitourinary: Positive for difficulty urinating, dysuria, frequency and urgency. Negative for flank pain, penile discharge, penile swelling, scrotal swelling and testicular pain.    Physical Exam Updated Vital Signs BP 124/80 (BP Location: Left Arm)   Pulse (!) 110   Temp 98.2 F (36.8 C) (Oral)   Resp 20   Ht 5\' 10"  (1.778 m)   Wt 65.8 kg   SpO2 98%   BMI 20.81 kg/m   Physical Exam Vitals and nursing note reviewed. Exam conducted with a chaperone present.  Constitutional:      General: He is not in acute distress.    Appearance: He is well-developed. He is not diaphoretic.  HENT:     Head:  Normocephalic and atraumatic.  Eyes:     General: No scleral icterus.    Conjunctiva/sclera: Conjunctivae normal.  Cardiovascular:     Rate and Rhythm: Normal rate and regular rhythm.     Heart sounds: Normal heart sounds.  Pulmonary:     Effort: Pulmonary effort is normal. No respiratory distress.     Breath sounds: Normal breath sounds.  Abdominal:     Palpations: Abdomen is soft.     Tenderness: There is no abdominal tenderness.  Genitourinary:    Pubic Area: No rash.      Penis: Normal and circumcised. No discharge, swelling or lesions.      Testes: Normal.  Musculoskeletal:     Cervical back: Normal range of motion and neck supple.  Skin:    General: Skin is warm and dry.  Neurological:     Mental Status: He is alert.  Psychiatric:        Behavior: Behavior normal.     ED Results / Procedures / Treatments   Labs (all labs ordered are listed, but only abnormal results are displayed) Labs Reviewed  CBG MONITORING, ED - Abnormal; Notable for the following components:      Result Value   Glucose-Capillary 193 (*)    All other components within normal limits  URINALYSIS, ROUTINE W REFLEX MICROSCOPIC    EKG None  Radiology No results found.  Procedures Procedures \  Medications Ordered in ED Medications - No data to display  ED Course  I have reviewed the triage vital signs and the nursing notes.  Pertinent labs & imaging results that were available during my care of the patient were reviewed by me and considered in my medical decision making (see chart for details).    MDM Rules/Calculators/A&P                          20 year old male with a history of type 1 diabetes who presents with urinary symptoms.  I ordered and reviewed labs which include a CBG which shows blood sugar of 193, urine shows large amount of hemoglobin, protein and bacteria.  GC chlamydia probe gathered.  Patient treated with IM Rocephin 500 mg and discharged on doxycycline.  Given STI  precautions, patient advised to follow-up with urology if symptoms are not improving in the next 2 weeks.  Discussed return precautions. Final Clinical Impression(s) / ED Diagnoses Final diagnoses:  None    Rx / DC Orders ED Discharge Orders    None       36, PA-C 05/21/20 1711    07/21/20, MD 05/24/20 [phone]

## 2020-05-21 NOTE — ED Notes (Signed)
26ml bladder scan 

## 2020-05-22 LAB — GC/CHLAMYDIA PROBE AMP (~~LOC~~) NOT AT ARMC

## 2020-05-23 ENCOUNTER — Emergency Department (HOSPITAL_BASED_OUTPATIENT_CLINIC_OR_DEPARTMENT_OTHER)
Admission: EM | Admit: 2020-05-23 | Discharge: 2020-05-23 | Disposition: A | Discharge status: Home / Self Care | Payer: Medicaid Other | Attending: Emergency Medicine | Admitting: Emergency Medicine

## 2020-05-23 ENCOUNTER — Encounter (HOSPITAL_BASED_OUTPATIENT_CLINIC_OR_DEPARTMENT_OTHER): Payer: Self-pay

## 2020-05-23 ENCOUNTER — Other Ambulatory Visit: Payer: Self-pay

## 2020-05-23 DIAGNOSIS — R300 Dysuria: Secondary | ICD-10-CM | POA: Diagnosis present

## 2020-05-23 DIAGNOSIS — N390 Urinary tract infection, site not specified: Secondary | ICD-10-CM | POA: Insufficient documentation

## 2020-05-23 DIAGNOSIS — E1010 Type 1 diabetes mellitus with ketoacidosis without coma: Secondary | ICD-10-CM | POA: Insufficient documentation

## 2020-05-23 DIAGNOSIS — Z794 Long term (current) use of insulin: Secondary | ICD-10-CM | POA: Diagnosis not present

## 2020-05-23 MED ORDER — CEPHALEXIN 500 MG PO CAPS: 500.0000 mg | ORAL_CAPSULE | Freq: Two times a day (BID) | ORAL | 0 refills | Status: AC

## 2020-05-23 NOTE — Discharge Instructions (Addendum)
Continue both doxycycline and Keflex for urinary tract infection/STD.  Please return if symptoms worsen but please give 48 to 72 hours while on antibiotics.

## 2020-05-23 NOTE — ED Provider Notes (Signed)
[facility] EMERGENCY DEPARTMENT Provider Note   CSN: [id number] Arrival date & time: 05/23/20  1414     History Chief Complaint  Patient presents with  . Dysuria    Logan Washington is a 20 y.o. male.  The history is provided by the patient.  Dysuria Presenting symptoms: dysuria   Presenting symptoms: no penile discharge and no penile pain   Context: spontaneously   Relieved by:  Nothing Worsened by:  Nothing Associated symptoms: urinary frequency   Associated symptoms: no abdominal pain, no flank pain, no hematuria, no penile swelling and no scrotal swelling        Past Medical History:  Diagnosis Date  . Diabetes mellitus without complication [facility])     Patient Active Problem List   Diagnosis Date Noted  . Influenza A 01/24/2018  . Abdominal pain 01/24/2018  . DKA, type 1 (HCC) 01/23/2018  . AKI (acute kidney injury) (HCC) 01/23/2018  . Abnormal LFTs 01/23/2018  . Chest pain 01/23/2018  . Diabetes mellitus without complication (HCC)   . Sports physical 10/25/2015    Past Surgical History:  Procedure Laterality Date  . TONSILLECTOMY         Family History  Problem Relation Age of Onset  . Breast cancer Mother   . Colon cancer Maternal Grandmother   . Sudden death Neg Hx   . Heart attack Neg Hx   . Stomach cancer Neg Hx   . Pancreatic cancer Neg Hx   . Esophageal cancer Neg Hx   . Rectal cancer Neg Hx     Social History   Tobacco Use  . Smoking status: Never Smoker  . Smokeless tobacco: Never Used  Vaping Use  . Vaping Use: Never used  Substance Use Topics  . Alcohol use: Not Currently  . Drug use: Yes    Types: Marijuana    Home Medications Prior to Admission medications   Medication Sig Start Date End Date Taking? Authorizing Provider  cephALEXin (KEFLEX) 500 MG capsule Take 1 capsule (500 mg total) by mouth 2 (two) times daily for 7 days. 05/23/20 05/30/20 Yes Aryanne Gilleland, DO  B-D UF III MINI PEN NEEDLES 31G X 5 MM MISC FOR  USE WITH INSULIN PENS 10/12/15   [provider]  dicyclomine (BENTYL) 20 MG tablet Take 1 tablet (20 mg total) by mouth 4 (four) times daily -  before meals and at bedtime. 11/17/19   Arnaldo Natal, NP  doxycycline (VIBRAMYCIN) 100 MG capsule Take 1 capsule (100 mg total) by mouth 2 (two) times daily. One po bid x 7 days 05/21/20   Arthor Captain, PA-C  glucagon, human recombinant, (GLUCAGEN DIAGNOSTIC) 1 MG injection Inject 1 mL into the muscle once as needed for low blood sugar. Patient not taking: Reported on 12/28/2019 08/30/15   [provider]  LANTUS SOLOSTAR 100 UNIT/ML Solostar Pen Inject 25 Units into the skin at bedtime. Patient taking differently: Inject 31 Units into the skin at bedtime.  01/25/18   Osvaldo Shipper, MD  NOVOLOG FLEXPEN 100 UNIT/ML FlexPen Inject 6-7 Units into the skin 3 (three) times daily with meals. Every 7 carbs =1 unit 10/09/15   [provider]  omeprazole (PRILOSEC) 40 MG capsule Take 1 capsule (40 mg total) by mouth daily. Best to take on an empty stomach.  20-30 minutes before breakfast. 12/28/19   Nandigam, Eleonore Chiquito, MD  ondansetron (ZOFRAN ODT) 4 MG disintegrating tablet Take 1 tablet (4 mg total) by mouth every 8 (eight)  hours as needed. 09/26/19   Long, Arlyss Repress, MD  ondansetron (ZOFRAN) 4 MG tablet Take 1 tablet (4 mg total) by mouth every 8 (eight) hours as needed for nausea or vomiting. 11/17/19   Arnaldo Natal, NP    Allergies    Tilapia [fish allergy]  Review of Systems   Review of Systems  Gastrointestinal: Negative for abdominal pain.  Genitourinary: Positive for dysuria, frequency and urgency. Negative for decreased urine volume, difficulty urinating, enuresis, flank pain, genital sores, hematuria, penile discharge, penile pain, penile swelling, scrotal swelling and testicular pain.  Skin: Negative for rash and wound.    Physical Exam Updated Vital Signs BP (!) 147/90 (BP Location: Left Arm)    Pulse 97   Temp 98.4 F (36.9 C) (Oral)   Resp 20   SpO2 95%   Physical Exam Constitutional:      Appearance: He is not ill-appearing.  Cardiovascular:     Pulses: Normal pulses.  Abdominal:     Tenderness: There is no abdominal tenderness.  Skin:    Capillary Refill: Capillary refill takes less than 2 seconds.  Neurological:     Mental Status: He is alert.     ED Results / Procedures / Treatments   Labs (all labs ordered are listed, but only abnormal results are displayed) Labs Reviewed - No data to display  EKG None  Radiology No results found.  Procedures Procedures   Medications Ordered in ED Medications - No data to display  ED Course  I have reviewed the triage vital signs and the nursing notes.  Pertinent labs & imaging results that were available during my care of the patient were reviewed by me and considered in my medical decision making (see chart for details).    MDM Rules/Calculators/A&P                          Logan Washington is here for dysuria.  Seen here 2 days ago.  Treated empirically for gonorrhea and chlamydia.  Urinalysis was consistent with infection.  He got a dose of Rocephin and started on doxycycline.  Follow-up better 24 hours after antibiotics but symptoms slightly worse today.  He has not been on antibiotics after 48 hours.  Will additionally add Keflex to cover more broadly for urine infection.  Otherwise he appears stable.  Discharged in good condition.  This chart was dictated using voice recognition software.  Despite best efforts to proofread,  errors can occur which can change the documentation meaning.   Final Clinical Impression(s) / ED Diagnoses Final diagnoses:  Lower urinary tract infectious disease    Rx / DC Orders ED Discharge Orders         Ordered    cephALEXin (KEFLEX) 500 MG capsule  2 times daily        05/23/20 1528           Virgina Norfolk, DO 05/23/20 1530

## 2020-05-23 NOTE — ED Triage Notes (Signed)
Pt c/o burning after voiding-states he was seen here for same 2 days ago and is taking abx-NAD-steady gait

## 2020-06-09 ENCOUNTER — Encounter (HOSPITAL_BASED_OUTPATIENT_CLINIC_OR_DEPARTMENT_OTHER): Payer: Self-pay | Admitting: *Deleted

## 2020-06-09 ENCOUNTER — Emergency Department (HOSPITAL_BASED_OUTPATIENT_CLINIC_OR_DEPARTMENT_OTHER)
Admission: EM | Admit: 2020-06-09 | Discharge: 2020-06-09 | Disposition: A | Discharge status: Home / Self Care | Payer: Medicaid Other | Attending: Emergency Medicine | Admitting: Emergency Medicine

## 2020-06-09 ENCOUNTER — Other Ambulatory Visit: Payer: Self-pay

## 2020-06-09 DIAGNOSIS — Z794 Long term (current) use of insulin: Secondary | ICD-10-CM | POA: Insufficient documentation

## 2020-06-09 DIAGNOSIS — L739 Follicular disorder, unspecified: Secondary | ICD-10-CM | POA: Insufficient documentation

## 2020-06-09 DIAGNOSIS — E119 Type 2 diabetes mellitus without complications: Secondary | ICD-10-CM | POA: Insufficient documentation

## 2020-06-09 DIAGNOSIS — L0291 Cutaneous abscess, unspecified: Secondary | ICD-10-CM | POA: Diagnosis present

## 2020-06-09 NOTE — ED Provider Notes (Signed)
[facility] EMERGENCY DEPARTMENT Provider Note   CSN: [id number] Arrival date & time: 06/09/20  1144     History Chief Complaint  Patient presents with  . Abscess    Logan Washington is a 20 y.o. male.  The history is provided by the patient.  Abscess Abscess location: GU area. Abscess quality: redness   Red streaking: no   Chronicity:  New Context: skin injury (shaved pubic hair and then area of redness popped up)   Relieved by:  Nothing Worsened by:  Nothing Associated symptoms: no fever        Past Medical History:  Diagnosis Date  . Diabetes mellitus without complication [facility])     Patient Active Problem List   Diagnosis Date Noted  . Influenza A 01/24/2018  . Abdominal pain 01/24/2018  . DKA, type 1 (HCC) 01/23/2018  . AKI (acute kidney injury) (HCC) 01/23/2018  . Abnormal LFTs 01/23/2018  . Chest pain 01/23/2018  . Diabetes mellitus without complication (HCC)   . Sports physical 10/25/2015    Past Surgical History:  Procedure Laterality Date  . TONSILLECTOMY         Family History  Problem Relation Age of Onset  . Breast cancer Mother   . Colon cancer Maternal Grandmother   . Sudden death Neg Hx   . Heart attack Neg Hx   . Stomach cancer Neg Hx   . Pancreatic cancer Neg Hx   . Esophageal cancer Neg Hx   . Rectal cancer Neg Hx     Social History   Tobacco Use  . Smoking status: Never Smoker  . Smokeless tobacco: Never Used  Vaping Use  . Vaping Use: Never used  Substance Use Topics  . Alcohol use: Not Currently  . Drug use: Yes    Types: Marijuana    Home Medications Prior to Admission medications   Medication Sig Start Date End Date Taking? Authorizing Provider  B-D UF III MINI PEN NEEDLES 31G X 5 MM MISC FOR USE WITH INSULIN PENS 10/12/15   [provider]  dicyclomine (BENTYL) 20 MG tablet Take 1 tablet (20 mg total) by mouth 4 (four) times daily -  before meals and at bedtime. 11/17/19   Arnaldo Natal, NP  doxycycline (VIBRAMYCIN) 100 MG capsule Take 1 capsule (100 mg total) by mouth 2 (two) times daily. One po bid x 7 days 05/21/20   Arthor Captain, PA-C  glucagon, human recombinant, (GLUCAGEN DIAGNOSTIC) 1 MG injection Inject 1 mL into the muscle once as needed for low blood sugar. Patient not taking: Reported on 12/28/2019 08/30/15   [provider]  LANTUS SOLOSTAR 100 UNIT/ML Solostar Pen Inject 25 Units into the skin at bedtime. Patient taking differently: Inject 31 Units into the skin at bedtime.  01/25/18   Osvaldo Shipper, MD  NOVOLOG FLEXPEN 100 UNIT/ML FlexPen Inject 6-7 Units into the skin 3 (three) times daily with meals. Every 7 carbs =1 unit 10/09/15   [provider]  omeprazole (PRILOSEC) 40 MG capsule Take 1 capsule (40 mg total) by mouth daily. Best to take on an empty stomach.  20-30 minutes before breakfast. 12/28/19   Nandigam, Eleonore Chiquito, MD  ondansetron (ZOFRAN ODT) 4 MG disintegrating tablet Take 1 tablet (4 mg total) by mouth every 8 (eight) hours as needed. 09/26/19   Long, Arlyss Repress, MD  ondansetron (ZOFRAN) 4 MG tablet Take 1 tablet (4 mg total) by mouth every 8 (eight) hours as needed for nausea or vomiting.  11/17/19   Arnaldo Natal, NP    Allergies    Tilapia [fish allergy]  Review of Systems   Review of Systems  Constitutional: Negative for fever.  Genitourinary: Negative for decreased urine volume, difficulty urinating, dysuria, penile pain, scrotal swelling, testicular pain and urgency.  Skin: Positive for color change. Negative for wound.    Physical Exam Updated Vital Signs BP (!) 135/91 (BP Location: Right Arm)   Pulse 78   Temp 98.5 F (36.9 C) (Oral)   Resp 20   Ht 5\' 10"  (1.778 m)   Wt 65.8 kg   SpO2 100%   BMI 20.81 kg/m   Physical Exam Constitutional:      General: He is not in acute distress.    Appearance: He is not ill-appearing.  Genitourinary:    Penis: Normal.      Testes: Normal.  Musculoskeletal:         General: Normal range of motion.  Skin:    General: Skin is warm.     Comments: Area of redness/inflammation in the mons pubis and one spot with no active drainage or fluctuance  Neurological:     Mental Status: He is alert.     ED Results / Procedures / Treatments   Labs (all labs ordered are listed, but only abnormal results are displayed) Labs Reviewed - No data to display  EKG None  Radiology No results found.  Procedures Procedures   Medications Ordered in ED Medications - No data to display  ED Course  I have reviewed the triage vital signs and the nursing notes.  Pertinent labs & imaging results that were available during my care of the patient were reviewed by me and considered in my medical decision making (see chart for details).    MDM Rules/Calculators/A&P                           [name] is here with lesion to his mons pubis.  Normal vitals.  No fever.  Overall appears to have a folliculitis.  Patient shaves pubic hair and in 1 area in the mons pubis he has some redness.  States he had some drainage like he popped a pimple.  Does not involve any of the mucosa.  Does not appear to be herpes or other type STD.  Overall recommend soap, water, Neosporin.  We will hold off on STD testing at this time as he is not having any other discomfort.  This is more in his suprapubic/mons pubis area and overall suspect a folliculitis.  Understands to allow wound to fully heal before engaging in sexual activity.  If this occurs again recommend follow-up for more extensive STD testing.  This chart was dictated using voice recognition software.  Despite best efforts to proofread,  errors can occur which can change the documentation meaning.    Final Clinical Impression(s) / ED Diagnoses Final diagnoses:  Folliculitis    Rx / DC Orders ED Discharge Orders    None       Cyndia Diver, DO 06/09/20 1226

## 2020-06-09 NOTE — ED Triage Notes (Signed)
Has a swollen, red area, has some bloody drainage, at suprapubic area. Denies any fevers. Was noted approx 3 days ago

## 2020-11-13 ENCOUNTER — Other Ambulatory Visit: Payer: Self-pay | Admitting: Nurse Practitioner

## 2020-12-19 ENCOUNTER — Other Ambulatory Visit: Payer: Self-pay | Admitting: Nurse Practitioner

## 2021-02-19 ENCOUNTER — Emergency Department (HOSPITAL_BASED_OUTPATIENT_CLINIC_OR_DEPARTMENT_OTHER): Payer: Medicaid Other

## 2021-02-19 ENCOUNTER — Emergency Department (HOSPITAL_BASED_OUTPATIENT_CLINIC_OR_DEPARTMENT_OTHER)
Admission: EM | Admit: 2021-02-19 | Discharge: 2021-02-19 | Disposition: A | Discharge status: Home / Self Care | Payer: Medicaid Other | Attending: Emergency Medicine | Admitting: Emergency Medicine

## 2021-02-19 ENCOUNTER — Encounter (HOSPITAL_BASED_OUTPATIENT_CLINIC_OR_DEPARTMENT_OTHER): Payer: Self-pay

## 2021-02-19 ENCOUNTER — Other Ambulatory Visit: Payer: Self-pay

## 2021-02-19 DIAGNOSIS — H5711 Ocular pain, right eye: Secondary | ICD-10-CM | POA: Diagnosis not present

## 2021-02-19 DIAGNOSIS — E109 Type 1 diabetes mellitus without complications: Secondary | ICD-10-CM | POA: Diagnosis not present

## 2021-02-19 DIAGNOSIS — R1033 Periumbilical pain: Secondary | ICD-10-CM | POA: Diagnosis not present

## 2021-02-19 DIAGNOSIS — R112 Nausea with vomiting, unspecified: Secondary | ICD-10-CM

## 2021-02-19 DIAGNOSIS — Z794 Long term (current) use of insulin: Secondary | ICD-10-CM | POA: Insufficient documentation

## 2021-02-19 DIAGNOSIS — R519 Headache, unspecified: Secondary | ICD-10-CM | POA: Diagnosis not present

## 2021-02-19 HISTORY — DX: Type 2 diabetes mellitus without complications: E11.9

## 2021-02-19 LAB — COMPREHENSIVE METABOLIC PANEL
ALT: 7 U/L (ref 0–44)
AST: 12 U/L — ABNORMAL LOW (ref 15–41)
Albumin: 4.4 g/dL (ref 3.5–5.0)
Alkaline Phosphatase: 60 U/L (ref 38–126)
Anion gap: 16 — ABNORMAL HIGH (ref 5–15)
BUN: 9 mg/dL (ref 6–20)
CO2: 21 mmol/L — ABNORMAL LOW (ref 22–32)
Calcium: 9.3 mg/dL (ref 8.9–10.3)
Chloride: 100 mmol/L (ref 98–111)
Creatinine, Ser: 0.97 mg/dL (ref 0.61–1.24)
GFR, Estimated: >60 mL/min (ref >60)
Glucose, Bld: 128 mg/dL — ABNORMAL HIGH (ref 70–99)
Potassium: 3.6 mmol/L (ref 3.5–5.1)
Sodium: 137 mmol/L (ref 135–145)
Total Bilirubin: 1.1 mg/dL (ref 0.3–1.2)
Total Protein: 7.5 g/dL (ref 6.5–8.1)

## 2021-02-19 LAB — CBC
HCT: 45.3 % (ref 39.0–52.0)
Hemoglobin: 15.8 g/dL (ref 13.0–17.0)
MCH: 28.5 pg (ref 26.0–34.0)
MCHC: 34.9 g/dL (ref 30.0–36.0)
MCV: 81.8 fL (ref 80.0–100.0)
Platelets: 392 10*3/uL (ref 150–400)
RBC: 5.54 MIL/uL (ref 4.22–5.81)
RDW: 12.1 % (ref 11.5–15.5)
WBC: 13.3 10*3/uL — ABNORMAL HIGH (ref 4.0–10.5)
nRBC: 0 % (ref 0.0–0.2)

## 2021-02-19 LAB — URINALYSIS, ROUTINE W REFLEX MICROSCOPIC
Bilirubin Urine: NEGATIVE
Glucose, UA: >1000 mg/dL — AB
Hgb urine dipstick: NEGATIVE
Ketones, ur: 40 mg/dL — AB
Leukocytes,Ua: NEGATIVE
Nitrite: NEGATIVE
Specific Gravity, Urine: >1.046 — ABNORMAL HIGH (ref 1.005–1.030)
pH: 6.5 (ref 5.0–8.0)

## 2021-02-19 LAB — RAPID URINE DRUG SCREEN, HOSP PERFORMED
Amphetamines: NOT DETECTED
Barbiturates: NOT DETECTED
Benzodiazepines: NOT DETECTED
Cocaine: NOT DETECTED
Opiates: NOT DETECTED
Tetrahydrocannabinol: POSITIVE — AB

## 2021-02-19 LAB — CBG MONITORING, ED
Glucose-Capillary: 126 mg/dL — ABNORMAL HIGH (ref 70–99)
Glucose-Capillary: 191 mg/dL — ABNORMAL HIGH (ref 70–99)

## 2021-02-19 LAB — LIPASE, BLOOD: Lipase: <10 U/L — ABNORMAL LOW (ref 11–51)

## 2021-02-19 LAB — BETA-HYDROXYBUTYRIC ACID: Beta-Hydroxybutyric Acid: 1.07 mmol/L — ABNORMAL HIGH (ref 0.05–0.27)

## 2021-02-19 MED ORDER — ONDANSETRON 4 MG PO TBDP
4.0000 mg | ORAL_TABLET | Freq: Three times a day (TID) | ORAL | 0 refills | Status: DC | PRN
  Filled 2021-02-19: qty 20, 7d supply, fill #0

## 2021-02-19 MED ORDER — PROCHLORPERAZINE MALEATE 10 MG PO TABS: 10.0000 mg | ORAL_TABLET | Freq: Once | ORAL | Status: DC

## 2021-02-19 MED ORDER — ONDANSETRON HCL 4 MG/2ML IJ SOLN
4.0000 mg | Freq: Once | INTRAMUSCULAR | Status: AC | PRN
  Administered 2021-02-19: 4 mg via INTRAVENOUS
  Filled 2021-02-19: qty 2

## 2021-02-19 MED ORDER — PROCHLORPERAZINE EDISYLATE 10 MG/2ML IJ SOLN
5.0000 mg | Freq: Once | INTRAMUSCULAR | Status: AC
  Administered 2021-02-19: 5 mg via INTRAVENOUS
  Filled 2021-02-19: qty 2

## 2021-02-19 MED ORDER — KETOROLAC TROMETHAMINE 60 MG/2ML IM SOLN: 30.0000 mg | Freq: Once | INTRAMUSCULAR | Status: DC

## 2021-02-19 MED ORDER — LACTATED RINGERS IV BOLUS
1000.0000 mL | Freq: Once | INTRAVENOUS | Status: AC
  Administered 2021-02-19: 1000 mL via INTRAVENOUS

## 2021-02-19 MED ORDER — KETOROLAC TROMETHAMINE 15 MG/ML IJ SOLN
15.0000 mg | Freq: Once | INTRAMUSCULAR | Status: AC
  Administered 2021-02-19: 15 mg via INTRAVENOUS
  Filled 2021-02-19: qty 1

## 2021-02-19 MED ORDER — DIPHENHYDRAMINE HCL 50 MG/ML IJ SOLN
25.0000 mg | Freq: Once | INTRAMUSCULAR | Status: AC
  Administered 2021-02-19: 25 mg via INTRAVENOUS
  Filled 2021-02-19: qty 1

## 2021-02-19 MED ORDER — IOHEXOL 300 MG/ML  SOLN
100.0000 mL | Freq: Once | INTRAMUSCULAR | Status: AC | PRN
  Administered 2021-02-19: 100 mL via INTRAVENOUS

## 2021-02-19 MED ORDER — DIPHENHYDRAMINE HCL 25 MG PO CAPS: 25.0000 mg | ORAL_CAPSULE | Freq: Once | ORAL | Status: DC

## 2021-02-19 NOTE — ED Notes (Signed)
Patient placed on O2 per PA request. Patient here for cc of headcw

## 2021-02-19 NOTE — ED Notes (Signed)
Pt attempting to provide a urine specimen at this time.

## 2021-02-19 NOTE — ED Provider Notes (Signed)
Patient appears improved not tolerating oral intake without additional vomiting.  He denies any pain at this time.  His anion gap was mildly elevated but bicarb appears only mildly depressed.  I doubt DKA.  Given additional fluid resuscitation with improvement of symptoms.  Recommending outpatient follow-up with his doctor in 2 or 3 days, advised immediate return if he has recurrent or persistent vomiting pain or any additional concerns to return immediately to the ER.   Cheryll Cockayne, MD 02/19/21 [phone]

## 2021-02-19 NOTE — ED Triage Notes (Signed)
Patient BIB [facility] from Home with Headache.  Patient awoke this AM with Headache that has worsened since. 1 Episode of Emesis en Route. Patient is T1DM. CBG 104 with [facility]. Other VSS.   Patient uncomfortable during Triage. A&Ox4. GCS 15. BIB Wheelchair/Stretcher.

## 2021-02-19 NOTE — ED Notes (Signed)
Pt given ginger ale and crackers with no episodes of nausea or vomiting.

## 2021-02-19 NOTE — ED Notes (Signed)
Pt provided discharge instructions and prescription information. Pt was given the opportunity to ask questions and questions were answered. Discharge signature not obtained in the setting of the COVID-19 pandemic in order to reduce high touch surfaces.  ° °

## 2021-02-19 NOTE — ED Notes (Signed)
Patient placed on O2 per PA request. Patient here for cc of headache.

## 2021-02-19 NOTE — Discharge Instructions (Signed)
Call your primary care doctor or specialist as discussed in the next 2-3 days.   Return immediately back to the ER if:  Your symptoms worsen within the next 12-24 hours. You develop new symptoms such as new fevers, persistent vomiting, new pain, shortness of breath, or new weakness or numbness, or if you have any other concerns.  

## 2021-02-19 NOTE — ED Notes (Addendum)
Pt placed on 2L of O2 for headache, per Victory Dakin - PA. Megan - RT and Madelaine Bhat - RN aware.

## 2021-02-19 NOTE — ED Provider Notes (Signed)
[facility] EMERGENCY DEPT Provider Note   CSN: [id number] Arrival date & time: 02/19/21  1605     History Chief Complaint  Patient presents with   Headache    Logan Washington is a 21 y.o. male history of type 1 diabetes presents the emergency department via EMS for headache, vomiting, and abdominal pain since this morning.  Patient reports he has had a pain going into his head and right eye that started at 1000 today.  He reports the pain has been getting worse.  He reports that the pain is stabbing is like someone is stabbing ice pick into his right eye.  He reports some nausea, 3 episodes of vomiting, no hematemesis.  He reports some photophobia and phonophobia.  Denies any dizziness, lightheadedness, syncope, fever, diarrhea, constipation, hematuria, dysuria, or URI symptoms.  He reports some central abdominal pain as well.  Medical history includes type 1 diabetes.  Denies any surgical history.  His medications include insulin (Lantus and Novolog).  No known drug allergies.  Daily marijuana smoker.   Headache Associated symptoms: abdominal pain, eye pain, nausea and vomiting   Associated symptoms: no back pain, no congestion, no cough, no diarrhea, no dizziness, no ear pain, no fever, no seizures, no sore throat and no weakness       Home Medications Prior to Admission medications   Not on File      Allergies    Patient has no allergy information on record.    Review of Systems   Review of Systems  Constitutional:  Negative for chills and fever.  HENT:  Negative for congestion, ear pain, rhinorrhea and sore throat.   Eyes:  Positive for pain. Negative for visual disturbance.  Respiratory:  Negative for cough and shortness of breath.   Cardiovascular:  Negative for chest pain and palpitations.  Gastrointestinal:  Positive for abdominal pain, nausea and vomiting. Negative for constipation and diarrhea.  Genitourinary:  Negative for dysuria and hematuria.   Musculoskeletal:  Negative for arthralgias and back pain.  Skin:  Negative for color change and rash.  Neurological:  Positive for headaches. Negative for dizziness, seizures, syncope, weakness and light-headedness.  All other systems reviewed and are negative.  Physical Exam Updated Vital Signs BP 134/67    Pulse 91    Temp 98.6 F (37 C) (Oral)    Resp 16    Ht 6' (1.829 m)    Wt 68 kg    SpO2 100%    BMI 20.34 kg/m  Physical Exam Vitals and nursing note reviewed.  Constitutional:      General: He is not in acute distress.    Appearance: Normal appearance. He is not ill-appearing or toxic-appearing.     Comments: Somnolent but answers questions when asked.  Not cooperative with my exam.  Alert and oriented x4.  HENT:     Head: Normocephalic and atraumatic.     Mouth/Throat:     Mouth: Mucous membranes are moist.  Eyes:     General: No scleral icterus.    Pupils: Pupils are equal, round, and reactive to light.     Comments: Patient would not cooperate with extraocular muscle intact test, but was able to test pupils which were equal and reactive to light bilaterally.  Neck:     Meningeal: Brudzinski's sign and Kernig's sign absent.  Cardiovascular:     Rate and Rhythm: Normal rate and regular rhythm.     Heart sounds: No murmur heard. Pulmonary:  Effort: Pulmonary effort is normal. No respiratory distress.     Breath sounds: Normal breath sounds.     Comments: Clear to auscultation bilaterally.  No respiratory distress, sensory muscle use, tripoding, nasal flaring or cyanosis present. Abdominal:     General: Abdomen is flat. Bowel sounds are normal.     Palpations: Abdomen is soft.     Tenderness: There is abdominal tenderness.     Comments: Tenderness to his periumbilical area with some voluntary guarding.  No rebound tenderness.  No overlying skin changes noted.  Normal active bowel sounds.  Musculoskeletal:        General: No deformity.     Cervical back: Normal range  of motion. No rigidity.  Skin:    General: Skin is warm and dry.  Neurological:     General: No focal deficit present.     Mental Status: He is oriented to person, place, and time. Mental status is at baseline.     GCS: GCS eye subscore is 4. GCS verbal subscore is 5. GCS motor subscore is 6.     Cranial Nerves: No cranial nerve deficit, dysarthria or facial asymmetry.     Sensory: No sensory deficit.     Motor: No weakness.    ED Results / Procedures / Treatments   Labs (all labs ordered are listed, but only abnormal results are displayed) Labs Reviewed  LIPASE, BLOOD - Abnormal; Notable for the following components:      Result Value   Lipase <10 (*)    All other components within normal limits  COMPREHENSIVE METABOLIC PANEL - Abnormal; Notable for the following components:   CO2 21 (*)    Glucose, Bld 128 (*)    AST 12 (*)    Anion gap 16 (*)    All other components within normal limits  CBC - Abnormal; Notable for the following components:   WBC 13.3 (*)    All other components within normal limits  CBG MONITORING, ED - Abnormal; Notable for the following components:   Glucose-Capillary 126 (*)    All other components within normal limits  URINALYSIS, ROUTINE W REFLEX MICROSCOPIC  RAPID URINE DRUG SCREEN, HOSP PERFORMED  BETA-HYDROXYBUTYRIC ACID    EKG None  Radiology CT Head Wo Contrast  Result Date: 02/19/2021 CLINICAL DATA:  Headache, sudden, severe EXAM: CT HEAD WITHOUT CONTRAST TECHNIQUE: Contiguous axial images were obtained from the base of the skull through the vertex without intravenous contrast. COMPARISON:  None. FINDINGS: Mildly motion limited study. Brain: No evidence of acute infarction, hemorrhage, hydrocephalus, extra-axial collection or mass lesion/mass effect. Vascular: No hyperdense vessel identified. Skull: No acute fracture. Sinuses/Orbits: Mild paranasal sinus mucosal thickening. Unremarkable orbits. Other: No mastoid effusions. IMPRESSION: No  evidence of acute intracranial abnormality. Electronically Signed   By: Margaretha Sheffield M.D.   On: 02/19/2021 18:07   CT ABDOMEN PELVIS W CONTRAST  Result Date: 02/19/2021 CLINICAL DATA:  Abdominal pain, acute, nonlocalized EXAM: CT ABDOMEN AND PELVIS WITH CONTRAST TECHNIQUE: Multidetector CT imaging of the abdomen and pelvis was performed using the standard protocol following bolus administration of intravenous contrast. CONTRAST:  141mL OMNIPAQUE IOHEXOL 300 MG/ML  SOLN COMPARISON:  None. FINDINGS: Lower chest: No acute abnormality. Hepatobiliary: No focal liver abnormality. No gallstones, gallbladder wall thickening, or pericholecystic fluid. No biliary dilatation. Pancreas: No focal lesion. Normal pancreatic contour. No surrounding inflammatory changes. No main pancreatic ductal dilatation. Spleen: Normal in size without focal abnormality. Adrenals/Urinary Tract: No adrenal nodule bilaterally. Bilateral kidneys enhance symmetrically.  No hydronephrosis. No hydroureter. The urinary bladder is unremarkable. Stomach/Bowel: Stomach is within normal limits. No evidence of bowel wall thickening or dilatation. Appendix appears normal. Vascular/Lymphatic: No abdominal aorta or iliac aneurysm. No abdominal, pelvic, or inguinal lymphadenopathy. Reproductive: Prostate is unremarkable. Other: No intraperitoneal free fluid. No intraperitoneal free gas. No organized fluid collection. Musculoskeletal: No abdominal wall hernia or abnormality. No suspicious lytic or blastic osseous lesions. No acute displaced fracture. IMPRESSION: No acute intra-abdominal or intrapelvic abnormality. Electronically Signed   By: Iven Finn M.D.   On: 02/19/2021 18:07    Procedures Procedures   Medications Ordered in ED Medications  ondansetron (ZOFRAN) injection 4 mg (4 mg Intravenous Given 02/19/21 1620)  iohexol (OMNIPAQUE) 300 MG/ML solution 100 mL (100 mLs Intravenous Contrast Given 02/19/21 1746)    ED Course/ Medical Decision  Making/ A&P                           Medical Decision Making  21 year old male presents emergency department for evaluation of headache, eye pain, abdominal pain, nausea and vomiting.  Differential diagnosis includes but is not limited to DKA, stroke, migraine, SAH, hyperglycemia, appendicitis, cholecystitis, cholelithiasis, cholangitis, SBO, viral gastroenteritis, cannabinoid induced hyperemesis syndrome, meningitis.  For vital signs, unremarkable.  Normotensive, afebrile, normal heart rate, satting 100% on room air without increased work of breathing.  Physical exam shows some abdominal tenderness with guarding to the periumbilical region.  Normal active bowel sounds with no overlying skin changes or distention noted.  Patient was uncooperative with my exam overall.  He did not allow me to assess his ocular muscles, but his pupils were equal round and reactive to light. Labs and imaging ordered.  At this time, low suspicion for meningitis as the patient is afebrile and he has a negative Kernig's and Brudzinski sign.  Labs and imaging to work-up remainder of differential.  Low suspicion for stroke given patient's normal neuro exam with equal strength.  CT abdomen pelvis shows no acute intra-abdominal or intrapelvic abnormality.  CT head shows no evidence of acute intra cranial abnormality.  For labs, CMP shows mildly decreased bicarb at 21 as well as decreased AST at 12.  He does have an elevated glucose at 128.  Patient has an anion gap at 16. CBC shows mildly elevated white blood cell count at 13.3, no signs of anemia.  Lipase less than 10.  Urinalysis shows concentrated urine with greater than 1000 glucose, ketones and protein present.  UDS positive for THC.  Elevated beta hydroxybutyric acid at 1.07.  New point-of-care CBG shows 191 blood sugar.  I discussed these labs with my attending physician.  The patient is not on any medications that would suggest euglycemic ketoacidosis.    Zofran and fluid  ordered.  The patient has not had any emesis since being here.  Migraine medication cocktail ordered.   Patient reports he is feeling better and his pain level has decreased from a 10 to a 6.  He still has not had any vomiting episodes.  Another liter of fluid ordered. He was more amenable to an examination at this time. EOM intact. Strength 5/5 in bilateral upper and lower extremities.   The presentation is more consistent with dehydration based on the patient's improving headache with fluids, no longer vomiting, and is able to eat and drink.  At this time, second liter of lactated Ringer's ordered.  Patient is tolerating p.o. Zofran sent to patient's pharmacy.  10:24 PM  Care of Logan Washington  transferred to Dr. Almyra Free at the end of my shift as the patient will require reassessment once labs/imaging have resulted. Patient presentation, ED course, and plan of care discussed with review of all pertinent labs and imaging. Please see his/her note for further details regarding further ED course and disposition. Plan at time of handoff is discharge pending fluids completion and no emesis. This may be altered or completely changed at the discretion of the oncoming team pending results of further workup.  Final Clinical Impression(s) / ED Diagnoses Final diagnoses:  Acute nonintractable headache, unspecified headache type  Nausea and vomiting, unspecified vomiting type    Rx / DC Orders ED Discharge Orders     None         Sherrell Puller, Hershal Coria 02/19/21 2230    Luna Fuse, MD 02/19/21 2321

## 2021-02-20 ENCOUNTER — Encounter (HOSPITAL_BASED_OUTPATIENT_CLINIC_OR_DEPARTMENT_OTHER): Payer: Self-pay | Admitting: *Deleted

## 2021-02-20 ENCOUNTER — Other Ambulatory Visit (HOSPITAL_BASED_OUTPATIENT_CLINIC_OR_DEPARTMENT_OTHER): Payer: Self-pay

## 2021-03-06 ENCOUNTER — Other Ambulatory Visit (HOSPITAL_BASED_OUTPATIENT_CLINIC_OR_DEPARTMENT_OTHER): Payer: Self-pay

## 2021-03-15 ENCOUNTER — Other Ambulatory Visit: Payer: Self-pay

## 2021-03-15 ENCOUNTER — Inpatient Hospital Stay (HOSPITAL_COMMUNITY): Payer: Medicaid Other

## 2021-03-15 ENCOUNTER — Inpatient Hospital Stay (HOSPITAL_COMMUNITY)
Admission: EM | Admit: 2021-03-15 | Discharge: 2021-03-19 | DRG: 637 | Disposition: A | Discharge status: Home / Self Care | Payer: Medicaid Other | Attending: Internal Medicine | Admitting: Internal Medicine

## 2021-03-15 ENCOUNTER — Encounter (HOSPITAL_COMMUNITY): Payer: Self-pay

## 2021-03-15 DIAGNOSIS — R7989 Other specified abnormal findings of blood chemistry: Secondary | ICD-10-CM | POA: Diagnosis present

## 2021-03-15 DIAGNOSIS — Z20822 Contact with and (suspected) exposure to covid-19: Secondary | ICD-10-CM | POA: Diagnosis present

## 2021-03-15 DIAGNOSIS — Z833 Family history of diabetes mellitus: Secondary | ICD-10-CM | POA: Diagnosis not present

## 2021-03-15 DIAGNOSIS — R112 Nausea with vomiting, unspecified: Secondary | ICD-10-CM | POA: Diagnosis present

## 2021-03-15 DIAGNOSIS — E875 Hyperkalemia: Secondary | ICD-10-CM | POA: Diagnosis present

## 2021-03-15 DIAGNOSIS — D75839 Thrombocytosis, unspecified: Secondary | ICD-10-CM | POA: Diagnosis present

## 2021-03-15 DIAGNOSIS — Z91013 Allergy to seafood: Secondary | ICD-10-CM

## 2021-03-15 DIAGNOSIS — E860 Dehydration: Secondary | ICD-10-CM | POA: Diagnosis present

## 2021-03-15 DIAGNOSIS — Z794 Long term (current) use of insulin: Secondary | ICD-10-CM

## 2021-03-15 DIAGNOSIS — D72829 Elevated white blood cell count, unspecified: Secondary | ICD-10-CM | POA: Diagnosis present

## 2021-03-15 DIAGNOSIS — K2970 Gastritis, unspecified, without bleeding: Secondary | ICD-10-CM | POA: Diagnosis present

## 2021-03-15 DIAGNOSIS — K226 Gastro-esophageal laceration-hemorrhage syndrome: Secondary | ICD-10-CM | POA: Diagnosis present

## 2021-03-15 DIAGNOSIS — R1310 Dysphagia, unspecified: Secondary | ICD-10-CM | POA: Diagnosis present

## 2021-03-15 DIAGNOSIS — Z9114 Patient's other noncompliance with medication regimen: Secondary | ICD-10-CM

## 2021-03-15 DIAGNOSIS — K920 Hematemesis: Secondary | ICD-10-CM

## 2021-03-15 DIAGNOSIS — E1010 Type 1 diabetes mellitus with ketoacidosis without coma: Secondary | ICD-10-CM | POA: Diagnosis present

## 2021-03-15 DIAGNOSIS — E876 Hypokalemia: Secondary | ICD-10-CM

## 2021-03-15 DIAGNOSIS — E1110 Type 2 diabetes mellitus with ketoacidosis without coma: Secondary | ICD-10-CM | POA: Diagnosis present

## 2021-03-15 DIAGNOSIS — N179 Acute kidney failure, unspecified: Secondary | ICD-10-CM | POA: Diagnosis present

## 2021-03-15 LAB — CBC WITH DIFFERENTIAL/PLATELET
Abs Immature Granulocytes: 0.59 10*3/uL — ABNORMAL HIGH (ref 0.00–0.07)
Basophils Absolute: 0.1 10*3/uL (ref 0.0–0.1)
Basophils Relative: 0 %
Eosinophils Absolute: 0 10*3/uL (ref 0.0–0.5)
Eosinophils Relative: 0 %
HCT: 52.7 % — ABNORMAL HIGH (ref 39.0–52.0)
Hemoglobin: 17.6 g/dL — ABNORMAL HIGH (ref 13.0–17.0)
Immature Granulocytes: 2 %
Lymphocytes Relative: 7 %
Lymphs Abs: 2.1 10*3/uL (ref 0.7–4.0)
MCH: 29.1 pg (ref 26.0–34.0)
MCHC: 33.4 g/dL (ref 30.0–36.0)
MCV: 87.1 fL (ref 80.0–100.0)
Monocytes Absolute: 1.4 10*3/uL — ABNORMAL HIGH (ref 0.1–1.0)
Monocytes Relative: 5 %
Neutro Abs: 24.5 10*3/uL — ABNORMAL HIGH (ref 1.7–7.7)
Neutrophils Relative %: 86 %
Platelets: 446 10*3/uL — ABNORMAL HIGH (ref 150–400)
RBC: 6.05 MIL/uL — ABNORMAL HIGH (ref 4.22–5.81)
RDW: 12.8 % (ref 11.5–15.5)
WBC: 28.7 10*3/uL — ABNORMAL HIGH (ref 4.0–10.5)
nRBC: 0 % (ref 0.0–0.2)

## 2021-03-15 LAB — GLUCOSE, CAPILLARY
Glucose-Capillary: 173 mg/dL — ABNORMAL HIGH (ref 70–99)
Glucose-Capillary: 176 mg/dL — ABNORMAL HIGH (ref 70–99)
Glucose-Capillary: 177 mg/dL — ABNORMAL HIGH (ref 70–99)
Glucose-Capillary: 181 mg/dL — ABNORMAL HIGH (ref 70–99)
Glucose-Capillary: 183 mg/dL — ABNORMAL HIGH (ref 70–99)
Glucose-Capillary: 232 mg/dL — ABNORMAL HIGH (ref 70–99)

## 2021-03-15 LAB — BASIC METABOLIC PANEL
Anion gap: 19 — ABNORMAL HIGH (ref 5–15)
Anion gap: 15 (ref 5–15)
Anion gap: 11 (ref 5–15)
BUN: 31 mg/dL — ABNORMAL HIGH (ref 6–20)
BUN: 28 mg/dL — ABNORMAL HIGH (ref 6–20)
BUN: 22 mg/dL — ABNORMAL HIGH (ref 6–20)
CO2: 9 mmol/L — ABNORMAL LOW (ref 22–32)
CO2: 12 mmol/L — ABNORMAL LOW (ref 22–32)
CO2: 12 mmol/L — ABNORMAL LOW (ref 22–32)
Calcium: 9.7 mg/dL (ref 8.9–10.3)
Calcium: 10.2 mg/dL (ref 8.9–10.3)
Calcium: 8.9 mg/dL (ref 8.9–10.3)
Chloride: 103 mmol/L (ref 98–111)
Chloride: 108 mmol/L (ref 98–111)
Chloride: 109 mmol/L (ref 98–111)
Creatinine, Ser: 1.5 mg/dL — ABNORMAL HIGH (ref 0.61–1.24)
Creatinine, Ser: 1.32 mg/dL — ABNORMAL HIGH (ref 0.61–1.24)
Creatinine, Ser: 1.05 mg/dL (ref 0.61–1.24)
GFR, Estimated: >60 mL/min (ref >60)
GFR, Estimated: >60 mL/min (ref >60)
GFR, Estimated: >60 mL/min (ref >60)
Glucose, Bld: 409 mg/dL — ABNORMAL HIGH (ref 70–99)
Glucose, Bld: 188 mg/dL — ABNORMAL HIGH (ref 70–99)
Glucose, Bld: 206 mg/dL — ABNORMAL HIGH (ref 70–99)
Potassium: 5.4 mmol/L — ABNORMAL HIGH (ref 3.5–5.1)
Potassium: 4.5 mmol/L (ref 3.5–5.1)
Potassium: 4.1 mmol/L (ref 3.5–5.1)
Sodium: 131 mmol/L — ABNORMAL LOW (ref 135–145)
Sodium: 135 mmol/L (ref 135–145)
Sodium: 132 mmol/L — ABNORMAL LOW (ref 135–145)

## 2021-03-15 LAB — RESP PANEL BY RT-PCR (FLU A&B, COVID) ARPGX2
Influenza A by PCR: NEGATIVE
Influenza B by PCR: NEGATIVE
SARS Coronavirus 2 by RT PCR: NEGATIVE

## 2021-03-15 LAB — BLOOD GAS, VENOUS
Acid-base deficit: 19.9 mmol/L — ABNORMAL HIGH (ref 0.0–2.0)
Bicarbonate: 9.5 mmol/L — ABNORMAL LOW (ref 20.0–28.0)
O2 Saturation: 60.1 %
Patient temperature: 98.6
pCO2, Ven: 30.6 mmHg — ABNORMAL LOW (ref 44.0–60.0)
pH, Ven: 7.118 — CL (ref 7.250–7.430)
pO2, Ven: 40.8 mmHg (ref 32.0–45.0)

## 2021-03-15 LAB — MRSA NEXT GEN BY PCR, NASAL: MRSA by PCR Next Gen: NOT DETECTED

## 2021-03-15 LAB — URINALYSIS, ROUTINE W REFLEX MICROSCOPIC
Bacteria, UA: NONE SEEN
Bilirubin Urine: NEGATIVE
Glucose, UA: >=1000 mg/dL — AB
Ketones, ur: >=80 mg/dL — AB
Leukocytes,Ua: NEGATIVE
Nitrite: NEGATIVE
Protein, ur: 30 mg/dL — AB
Specific Gravity, Urine: 1.025 (ref 1.005–1.030)
pH: 6 (ref 5.0–8.0)

## 2021-03-15 LAB — CBG MONITORING, ED
Glucose-Capillary: 407 mg/dL — ABNORMAL HIGH (ref 70–99)
Glucose-Capillary: 304 mg/dL — ABNORMAL HIGH (ref 70–99)
Glucose-Capillary: 267 mg/dL — ABNORMAL HIGH (ref 70–99)
Glucose-Capillary: 181 mg/dL — ABNORMAL HIGH (ref 70–99)

## 2021-03-15 LAB — PHOSPHORUS: Phosphorus: 1.5 mg/dL — ABNORMAL LOW (ref 2.5–4.6)

## 2021-03-15 LAB — HIV ANTIBODY (ROUTINE TESTING W REFLEX): HIV Screen 4th Generation wRfx: NONREACTIVE

## 2021-03-15 LAB — BETA-HYDROXYBUTYRIC ACID
Beta-Hydroxybutyric Acid: >8 mmol/L — ABNORMAL HIGH (ref 0.05–0.27)
Beta-Hydroxybutyric Acid: 3.74 mmol/L — ABNORMAL HIGH (ref 0.05–0.27)

## 2021-03-15 LAB — MAGNESIUM: Magnesium: 1.9 mg/dL (ref 1.7–2.4)

## 2021-03-15 MED ORDER — LACTATED RINGERS IV BOLUS: 20.0000 mL/kg | Freq: Once | INTRAVENOUS | Status: DC

## 2021-03-15 MED ORDER — DEXTROSE IN LACTATED RINGERS 5 % IV SOLN: INTRAVENOUS | Status: DC

## 2021-03-15 MED ORDER — LACTATED RINGERS IV BOLUS: 2000.0000 mL | Freq: Once | INTRAVENOUS | Status: DC

## 2021-03-15 MED ORDER — CHLORHEXIDINE GLUCONATE CLOTH 2 % EX PADS
6.0000 | MEDICATED_PAD | Freq: Every day | CUTANEOUS | Status: DC
  Administered 2021-03-15 – 2021-03-19 (×3): 6 via TOPICAL

## 2021-03-15 MED ORDER — DICYCLOMINE HCL 20 MG PO TABS
20.0000 mg | ORAL_TABLET | Freq: Three times a day (TID) | ORAL | Status: DC
  Administered 2021-03-17 – 2021-03-19 (×9): 20 mg via ORAL
  Filled 2021-03-15 (×18): qty 1

## 2021-03-15 MED ORDER — ONDANSETRON HCL 4 MG/2ML IJ SOLN
4.0000 mg | Freq: Four times a day (QID) | INTRAMUSCULAR | Status: AC
  Administered 2021-03-15 – 2021-03-16 (×2): 4 mg via INTRAVENOUS
  Filled 2021-03-15 (×2): qty 2

## 2021-03-15 MED ORDER — SODIUM CHLORIDE 0.9 % IV BOLUS
2000.0000 mL | Freq: Once | INTRAVENOUS | Status: AC
  Administered 2021-03-15: 2000 mL via INTRAVENOUS

## 2021-03-15 MED ORDER — LACTATED RINGERS IV SOLN: INTRAVENOUS | Status: DC

## 2021-03-15 MED ORDER — MENTHOL 3 MG MT LOZG
1.0000 | LOZENGE | OROMUCOSAL | Status: DC | PRN
  Administered 2021-03-15 – 2021-03-17 (×5): 3 mg via ORAL
  Filled 2021-03-15 (×2): qty 9

## 2021-03-15 MED ORDER — DEXTROSE 50 % IV SOLN: 0.0000 mL | INTRAVENOUS | Status: DC | PRN

## 2021-03-15 MED ORDER — PANTOPRAZOLE SODIUM 40 MG IV SOLR
40.0000 mg | Freq: Two times a day (BID) | INTRAVENOUS | Status: DC
  Administered 2021-03-15 – 2021-03-17 (×5): 40 mg via INTRAVENOUS
  Filled 2021-03-15 (×5): qty 40

## 2021-03-15 MED ORDER — TRAMADOL HCL 50 MG PO TABS
50.0000 mg | ORAL_TABLET | Freq: Once | ORAL | Status: AC
  Administered 2021-03-15: 50 mg via ORAL
  Filled 2021-03-15: qty 1

## 2021-03-15 MED ORDER — LACTATED RINGERS IV BOLUS
1500.0000 mL | Freq: Once | INTRAVENOUS | Status: AC
  Administered 2021-03-15: 1500 mL via INTRAVENOUS

## 2021-03-15 MED ORDER — INSULIN REGULAR(HUMAN) IN NACL 100-0.9 UT/100ML-% IV SOLN: INTRAVENOUS | Status: DC

## 2021-03-15 MED ORDER — INSULIN REGULAR(HUMAN) IN NACL 100-0.9 UT/100ML-% IV SOLN
INTRAVENOUS | Status: DC
  Administered 2021-03-15: 8 [IU]/h via INTRAVENOUS
  Filled 2021-03-15: qty 100

## 2021-03-15 MED ORDER — DEXTROSE 5 % IV SOLN
30.0000 mmol | Freq: Once | INTRAVENOUS | Status: AC
  Administered 2021-03-15: 30 mmol via INTRAVENOUS
  Filled 2021-03-15: qty 10

## 2021-03-15 NOTE — ED Provider Notes (Signed)
[facility]-EMERGENCY DEPT Provider Note   CSN: [id number] Arrival date & time: 03/15/21  1304     History  Chief Complaint  Patient presents with   Nausea   Emesis    Logan Washington is a 21 y.o. male.   Emesis Patient is a 21 year old type I diabetic he states that he was recently incarcerated for 5 days ago for an assault charge.  He states that he was not given insulin while in jail.  He states that over the past 4 days he has become more nausea as he has been vomiting he has had some hematemesis he has had abdominal pain and felt fatigued lethargic and unwell  He denies any chest pain or difficulty breathing, no fevers, no urinary frequency urgency dysuria.  He has had occasional coughing tested negative for COVID in jail.       Home Medications Prior to Admission medications   Medication Sig Start Date End Date Taking? Authorizing Provider  LANTUS SOLOSTAR 100 UNIT/ML Solostar Pen Inject 25 Units into the skin at bedtime. Patient taking differently: Inject 31 Units into the skin at bedtime. 01/25/18  Yes Osvaldo Shipper, MD  NOVOLOG FLEXPEN 100 UNIT/ML FlexPen Inject 6-7 Units into the skin 3 (three) times daily with meals. Every 7 carbs =1 unit 10/09/15  Yes [provider]  B-D UF III MINI PEN NEEDLES 31G X 5 MM MISC FOR USE WITH INSULIN PENS 10/12/15   [provider]  dicyclomine (BENTYL) 20 MG tablet Take 1 tablet (20 mg total) by mouth 4 (four) times daily -  before meals and at bedtime. Patient not taking: Reported on 03/15/2021 11/17/19   Arnaldo Natal, NP  doxycycline (VIBRAMYCIN) 100 MG capsule Take 1 capsule (100 mg total) by mouth 2 (two) times daily. One po bid x 7 days Patient not taking: Reported on 03/15/2021 05/21/20   Arthor Captain, PA-C  omeprazole (PRILOSEC) 40 MG capsule Take 1 capsule (40 mg total) by mouth daily. Best to take on an empty stomach.  20-30 minutes before breakfast. Patient not taking:  Reported on 03/15/2021 12/28/19   Napoleon Form, MD  ondansetron (ZOFRAN ODT) 4 MG disintegrating tablet Take 1 tablet (4 mg total) by mouth every 8 (eight) hours as needed. Patient not taking: Reported on 03/15/2021 09/26/19   Long, Arlyss Repress, MD  ondansetron (ZOFRAN) 4 MG tablet Take 1 tablet (4 mg total) by mouth every 8 (eight) hours as needed for nausea or vomiting. Patient not taking: Reported on 03/15/2021 11/17/19   Arnaldo Natal, NP  ondansetron (ZOFRAN-ODT) 4 MG disintegrating tablet Take 1 tablet (4 mg total) by mouth every 8 (eight) hours as needed for nausea or vomiting. Patient not taking: Reported on 03/15/2021 02/19/21   Achille Rich, PA-C      Allergies    Tilapia [fish allergy]    Review of Systems   Review of Systems  Gastrointestinal:  Positive for vomiting.   Physical Exam Updated Vital Signs BP 139/86    Pulse 96    Temp 98.8 F (37.1 C) (Oral)    Resp 15    SpO2 100%  Physical Exam Vitals and nursing note reviewed.  Constitutional:      Appearance: He is ill-appearing.  HENT:     Head: Normocephalic and atraumatic.     Nose: Nose normal.     Mouth/Throat:     Mouth: Mucous membranes are dry.  Eyes:     General: No scleral icterus. Cardiovascular:  Rate and Rhythm: Normal rate and regular rhythm.     Pulses: Normal pulses.     Heart sounds: Normal heart sounds.  Pulmonary:     Effort: Pulmonary effort is normal. No respiratory distress.     Breath sounds: Normal breath sounds. No wheezing.  Abdominal:     Palpations: Abdomen is soft.     Tenderness: There is no abdominal tenderness. There is no guarding or rebound.  Musculoskeletal:     Cervical back: Normal range of motion.     Right lower leg: No edema.     Left lower leg: No edema.  Skin:    General: Skin is warm and dry.     Capillary Refill: Capillary refill takes less than 2 seconds.  Neurological:     Mental Status: He is alert. Mental status is at baseline.  Psychiatric:         Mood and Affect: Mood normal.        Behavior: Behavior normal.    ED Results / Procedures / Treatments   Labs (all labs ordered are listed, but only abnormal results are displayed) Labs Reviewed  BASIC METABOLIC PANEL - Abnormal; Notable for the following components:      Result Value   Sodium 131 (*)    Potassium 5.4 (*)    CO2 9 (*)    Glucose, Bld 409 (*)    BUN 31 (*)    Creatinine, Ser 1.50 (*)    Anion gap 19 (*)    All other components within normal limits  BETA-HYDROXYBUTYRIC ACID - Abnormal; Notable for the following components:   Beta-Hydroxybutyric Acid >8.00 (*)    All other components within normal limits  CBC WITH DIFFERENTIAL/PLATELET - Abnormal; Notable for the following components:   WBC 28.7 (*)    RBC 6.05 (*)    Hemoglobin 17.6 (*)    HCT 52.7 (*)    Platelets 446 (*)    All other components within normal limits  URINALYSIS, ROUTINE W REFLEX MICROSCOPIC - Abnormal; Notable for the following components:   Color, Urine STRAW (*)    Glucose, UA >=1000 (*)    Hgb urine dipstick SMALL (*)    Ketones, ur >=80 (*)    Protein, ur 30 (*)    All other components within normal limits  BLOOD GAS, VENOUS - Abnormal; Notable for the following components:   pH, Ven 7.118 (*)    pCO2, Ven 30.6 (*)    Bicarbonate 9.5 (*)    Acid-base deficit 19.9 (*)    All other components within normal limits  CBG MONITORING, ED - Abnormal; Notable for the following components:   Glucose-Capillary 407 (*)    All other components within normal limits  CBG MONITORING, ED - Abnormal; Notable for the following components:   Glucose-Capillary 304 (*)    All other components within normal limits  CBG MONITORING, ED - Abnormal; Notable for the following components:   Glucose-Capillary 267 (*)    All other components within normal limits  RESP PANEL BY RT-PCR (FLU A&B, COVID) [id number]  BASIC METABOLIC PANEL  BASIC METABOLIC PANEL  BASIC METABOLIC PANEL  BETA-HYDROXYBUTYRIC ACID     EKG None  Radiology No results found.  Procedures .Critical Care Performed by: Gailen ShelterFondaw, Tasheema Perrone S, PA Authorized by: Gailen ShelterFondaw, Dequon Schnebly S, PA   Critical care provider statement:    Critical care time (minutes):  35   Critical care time was exclusive of:  Separately billable procedures and treating other patients  and teaching time   Critical care was necessary to treat or prevent imminent or life-threatening deterioration of the following conditions:  Metabolic crisis   Critical care was time spent personally by me on the following activities:  Development of treatment plan with patient or surrogate, review of old charts, re-evaluation of patient's condition, pulse oximetry, ordering and review of radiographic studies, ordering and review of laboratory studies, ordering and performing treatments and interventions, obtaining history from patient or surrogate, examination of patient and evaluation of patient's response to treatment   Care discussed with: admitting provider      Medications Ordered in ED Medications  insulin regular, human (MYXREDLIN) 100 units/ 100 mL infusion (6 Units/hr Intravenous Rate/Dose Change 03/15/21 1621)  dextrose 5 % in lactated ringers infusion (has no administration in time range)  dextrose 50 % solution 0-50 mL (has no administration in time range)  lactated ringers bolus 1,500 mL (0 mLs Intravenous Stopped 03/15/21 1515)    ED Course/ Medical Decision Making/ A&P Clinical Course as of 03/15/21 1659  Thu Mar 15, 2021  1657 pH, Ven(!!): 7.118 [WF]  1657 Anion gap(!): 19 [WF]  1658 Ketones, ur(!): >=80 [WF]    Clinical Course User Index [WF] Gailen Shelter, PA                           Medical Decision Making Amount and/or Complexity of Data Reviewed Labs: ordered.  Risk Prescription drug management. Decision regarding hospitalization.   This patient presents to the ED for concern of nausea, vomiting, hematemesis, abdominal pain, this involves a  number of treatment options, and is a complaint that carries with it a high risk of complications and morbidity.  The differential diagnosis includes DKA, intra-abdominal infection   Co morbidities: Discussed in HPI   Brief History:  Patient is a 21 year old type I diabetic he states that he was recently incarcerated for 5 days ago for an assault charge.  He states that he was not given insulin while in jail.  He states that over the past 4 days he has become more nausea as he has been vomiting he has had some hematemesis he has had abdominal pain and felt fatigued lethargic and unwell  He denies any chest pain or difficulty breathing, no fevers, no urinary frequency urgency dysuria.  He has had occasional coughing tested negative for COVID in jail.  Dry oral mucosa. Tachycardia.  Generally unwell appearing.  Not tachypneic.  Clear lungs.  Soft abdomen.  EMR reviewed including pt PMHx, past surgical history and past visits to ER.   See HPI for more details   Lab Tests:  I ordered and independently interpreted labs.  The pertinent results include:    Labs notable for Patient is a 20 BMP notable for glucose of 409 anion gap of 19.  Kidney function slightly abnormal consistent with prerenal AKI BUN/creatinine ratio greater than 15.  Patient does appear dry we will IV hydrate.  Urinalysis shows ketones greater than 8 and bedrest.  Greater than eights patient is acidotic pH is 7.1.  Given the patient is greater than 7 and mentating well we will hold off on bicarb.  CBC with leukocytosis erythrocytosis increased platelet count consistent with dehydration hemoconcentration.  I do not suspect a infectious process.  I suspect medication noncompliance as a cause  Imaging Studies:  No imaging studies ordered for this patient    Cardiac Monitoring:  NA NA   Medicines ordered:  I ordered medication including insulin, lactated Ringer's for DKA Reevaluation of the patient after these  medicines showed that the patient improved I have reviewed the patients home medicines and have made adjustments as needed   Critical Interventions:  Insulin administration   Consults:  I requested consultation with hospitalist,  and discussed lab and imaging findings as well as pertinent plan - they recommend: Admission    Reevaluation:  After the interventions noted above I re-evaluated patient and found that they have :improved   Social Determinants of Health:  The patient's social determinants of health were a factor in the care of this patient    Problem List / ED Course:  DKA acidosis   Dispostion:  After consideration of the diagnostic results and the patients response to treatment, I feel that the patent would benefit from admission      Final Clinical Impression(s) / ED Diagnoses Final diagnoses:  Diabetic ketoacidosis without coma associated with type 1 diabetes mellitus [facility](HCC)    Rx / DC Orders ED Discharge Orders     None         Gailen ShelterFondaw, Antinio Sanderfer S, [place] 03/15/21 1642    Gerhard MunchLockwood, Robert, MD 03/16/21 1030

## 2021-03-15 NOTE — ED Triage Notes (Signed)
Pt BIB EMS from jail. EMS states pt is possible DKA. CBG 343. Pt c/o N/V x5 days.  18G L AC 500 mL NS given by EMS 4 mg zofran IV given by EMS

## 2021-03-15 NOTE — H&P (Signed)
History and Physical    Logan Washington QMV:[id number] DOB: 06/06/2000 DOA: 03/15/2021  PCP: Patient, No Pcp Per (Inactive)  Patient coming from: Home  I have personally briefly reviewed patient's old medical records in [facility]  Chief Complaint: Nausea/vomiting/abdominal pain  HPI: Logan Washington is a 21 y.o. male with medical history significant of type 1 diabetes (was at age 54) who stated recently incarcerated for 5 days ago for assault charges (communicating threats) and did not receive any insulin while she was in jail.  Patient states for the past 4 days he has become more nauseous, emesis, epigastric abdominal pain.  Patient states he has had some bouts of hematemesis as well as hemoptysis.  Patient endorses dizziness, lightheadedness, fatigue, lethargy, feeling unwell.  Patient does endorse some chest pain with emesis.  Patient complaining of esophageal pain.  Patient denies any fevers, no chills, no shortness of breath, no melena, no hematochezia, no dysuria, no diarrhea, no constipation.  Patient denied any difficulty breathing stated tested negative in COVID in jail.  Denies any urinary frequency or urgency.  Denies any physical assault.  ED Course: Patient seen in the ED, venous pH of 7.116, PCO2 of 31, PO2 of 41, bicarb of 9.5.  Basic metabolic profile with a sodium of 131, potassium of 5.4, bicarb of 9, glucose of 409, BUN of 31, creatinine of 1.50 otherwise was within normal limits.  CBC with differential done with a white count of 28.7, hemoglobin of 17.6, platelet count of 446.  Hydroxybutyric acid > 8.  Urinalysis with >1000, small hemoglobin, ketonuria, nitrite negative, leukocytes negative, protein of 30, but WBC 0-5.  Chest x-ray pending.  Review of Systems: As per HPI otherwise all other systems reviewed and are negative.  Past Medical History:  Diagnosis Date   Diabetes (HCC)    Diabetes mellitus without complication (HCC)     Past Surgical History:  Procedure  Laterality Date   TONSILLECTOMY      Social History  reports that he has never smoked. He has never used smokeless tobacco. He reports that he does not drink alcohol and does not use drugs.  Allergies  Allergen Reactions   Tilapia [Fish Allergy] Swelling    Mouth     Family History  Problem Relation Age of Onset   Breast cancer Mother    Lung cancer Mother    Colon cancer Maternal Grandmother    Sudden death Neg Hx    Heart attack Neg Hx    Stomach cancer Neg Hx    Pancreatic cancer Neg Hx    Esophageal cancer Neg Hx    Rectal cancer Neg Hx    Mother deceased age 50 from breast cancer, lung cancer.  Father alive with a history of diabetes otherwise patient states does not know much about his father.  Prior to Admission medications   Medication Sig Start Date End Date Taking? Authorizing Provider  LANTUS SOLOSTAR 100 UNIT/ML Solostar Pen Inject 25 Units into the skin at bedtime. Patient taking differently: Inject 31 Units into the skin at bedtime. 01/25/18  Yes Osvaldo Shipper, MD  NOVOLOG FLEXPEN 100 UNIT/ML FlexPen Inject 6-7 Units into the skin 3 (three) times daily with meals. Every 7 carbs =1 unit 10/09/15  Yes [provider]  B-D UF III MINI PEN NEEDLES 31G X 5 MM MISC FOR USE WITH INSULIN PENS 10/12/15   [provider]  dicyclomine (BENTYL) 20 MG tablet Take 1 tablet (20 mg total) by mouth 4 (four) times  daily -  before meals and at bedtime. Patient not taking: Reported on 03/15/2021 11/17/19   Arnaldo NatalKennedy-Smith, Colleen M, NP  doxycycline (VIBRAMYCIN) 100 MG capsule Take 1 capsule (100 mg total) by mouth 2 (two) times daily. One po bid x 7 days Patient not taking: Reported on 03/15/2021 05/21/20   Arthor CaptainHarris, Abigail, PA-C  omeprazole (PRILOSEC) 40 MG capsule Take 1 capsule (40 mg total) by mouth daily. Best to take on an empty stomach.  20-30 minutes before breakfast. Patient not taking: Reported on 03/15/2021 12/28/19   Napoleon FormNandigam, Kavitha V, MD  ondansetron (ZOFRAN  ODT) 4 MG disintegrating tablet Take 1 tablet (4 mg total) by mouth every 8 (eight) hours as needed. Patient not taking: Reported on 03/15/2021 09/26/19   Long, Arlyss RepressJoshua G, MD  ondansetron (ZOFRAN) 4 MG tablet Take 1 tablet (4 mg total) by mouth every 8 (eight) hours as needed for nausea or vomiting. Patient not taking: Reported on 03/15/2021 11/17/19   Arnaldo NatalKennedy-Smith, Colleen M, NP  ondansetron (ZOFRAN-ODT) 4 MG disintegrating tablet Take 1 tablet (4 mg total) by mouth every 8 (eight) hours as needed for nausea or vomiting. Patient not taking: Reported on 03/15/2021 02/19/21   Achille RichRansom, Riley, [place]PA-C    Physical Exam: Vitals:   03/15/21 1515 03/15/21 1530 03/15/21 1545 03/15/21 1721  BP: (!) 148/84 (!) 150/89 (!) 148/97 137/77  Pulse: (!) 103  (!) 105 (!) 104  Resp: 18 19 16 17   Temp:      TempSrc:      SpO2: 100%  100% 100%    Constitutional: NAD, calm, comfortable.  Dry mucous membranes. Vitals:   03/15/21 1515 03/15/21 1530 03/15/21 1545 03/15/21 1721  BP: (!) 148/84 (!) 150/89 (!) 148/97 137/77  Pulse: (!) 103  (!) 105 (!) 104  Resp: 18 19 16 17   Temp:      TempSrc:      SpO2: 100%  100% 100%   Eyes: PERRL, lids and conjunctivae normal ENMT: Mucous membranes are dry. Posterior pharynx clear of any exudate or lesions.Normal dentition.  Neck: normal, supple, no masses, no thyromegaly Respiratory: clear to auscultation bilaterally, no wheezing, no crackles. Normal respiratory effort. No accessory muscle use.  Cardiovascular: Tachycardia.  No murmurs rubs or gallops.  No lower extremity edema.  2+ pedal pulses.  No carotid bruits.   Abdomen: no tenderness, no masses palpated. No hepatosplenomegaly. Bowel sounds positive.  Musculoskeletal: no clubbing / cyanosis. No joint deformity upper and lower extremities. Good ROM, no contractures. Normal muscle tone.  Ankle bracelet/home monitoring device on left ankle. Skin: no rashes, lesions, ulcers. No induration Neurologic: CN 2-12 grossly intact.  Sensation intact, DTR normal. Strength 5/5 in all 4.  Psychiatric: Normal judgment and insight. Alert and oriented x 3. Normal mood.   Labs on Admission: I have personally reviewed following labs and imaging studies  CBC: Recent Labs  Lab 03/15/21 1334  WBC 28.7*  NEUTROABS PENDING  HGB 17.6*  HCT 52.7*  MCV 87.1  PLT 446*    Basic Metabolic Panel: Recent Labs  Lab 03/15/21 1334  NA 131*  K 5.4*  CL 103  CO2 9*  GLUCOSE 409*  BUN 31*  CREATININE 1.50*  CALCIUM 9.7    GFR: CrCl cannot be calculated (Unknown ideal weight.).  Liver Function Tests: No results for input(s): AST, ALT, ALKPHOS, BILITOT, PROT, ALBUMIN in the last 168 hours.  Urine analysis:    Component Value Date/Time   COLORURINE STRAW (A) 03/15/2021 1334   APPEARANCEUR CLEAR 03/15/2021  1334   LABSPEC 1.025 03/15/2021 1334   PHURINE 6.0 03/15/2021 1334   GLUCOSEU >=1000 (A) 03/15/2021 1334   HGBUR SMALL (A) 03/15/2021 1334   BILIRUBINUR NEGATIVE 03/15/2021 1334   KETONESUR >=80 (A) 03/15/2021 1334   PROTEINUR 30 (A) 03/15/2021 1334   NITRITE NEGATIVE 03/15/2021 1334   LEUKOCYTESUR NEGATIVE 03/15/2021 1334    Radiological Exams on Admission: No results found.  EKG: Not done.  Assessment/Plan Principal Problem:   DKA (diabetic ketoacidosis) (HCC) Active Problems:   DKA, type 1 (HCC)   AKI (acute kidney injury) (HCC)   Dehydration   Hematemesis   Leukocytosis   Hyperkalemia   #1 DKA/type I diabetic -Patient presenting in DKA secondary to medication noncompliance as he was incarcerated in jail and stated did not receive any of his insulin. -Patient on admission noted to have a blood glucose level of 409 on basic metabolic profile, noted to be acidotic with a bicarb of 9, potassium of 5.4, anion gap of 19.  Beta hydroxybutyrate was elevated at > 8.  -Urinalysis with glycosuria and ketonuria. -Urinalysis nitrite negative, leukocytes negative.  Chest x-ray pending. -Check a hemoglobin  A1c. -Patient started on the Endo tool/insulin drip in the ED which we will continue. -2 L normal saline fluid bolus. -Continue BMET every 4 hours, CBG every hour. -Once anion gap is closed, acidosis resolved could transition back to home regimen of Lantus 31 units daily and placed on sliding scale insulin. -Keep n.p.o., may have caloric beverages. -Consult with diabetic coordinator.  2.  Hematemesis/hemoptysis -Patient with complaints of hematemesis prior to admission however no hematemesis noted in the ED. -Patient complain of some abdominal pain, esophageal pain likely secondary to nausea and emesis. -Patient noted to have EGD done 12/28/2019 which showed LA grade a reflux esophagitis with gastritis. -Hematemesis could be secondary to Mallory-Weiss tear. -Placed on PPI IV every 12 hours. -Follow H&H. -If continued hematemesis may require GI input. -Supportive care.  3.  Dehydration -Noted on lab work with elevated hemoglobin levels, thrombocytosis, leukocytosis -IV fluids.  4.  Leukocytosis Likely a reactive leukocytosis secondary to problem #1 and 2 and #3. -Patient afebrile. -Urinalysis nitrite negative leukocytes negative. -Chest x-ray pending. -Hold off on antibiotics at this time. -Repeat labs in the morning if leukocytosis worsens may consider blood cultures at that time. -Supportive care.  5.  History of esophagitis/gastritis -PPI.  6.  Hyperkalemia -Secondary to problem #1. -Repeat labs.  7.  Acute kidney injury -Likely secondary to prerenal azotemia from GI losses. -IV fluids. -If no improvement with acute kidney injury in the next 24 hours we will check a renal ultrasound.    DVT prophylaxis: SCDs Code Status:   Full Family Communication:  Updated patient.  No family at bedside Disposition Plan:   Patient is from:  Home  Anticipated DC to:  Home  Anticipated DC date:  2 to 3 days  Anticipated DC barriers: Clinical improvement  Consults called:   None Admission status: Admit to inpatient/stepdown unit.  Severity of Illness: The appropriate patient status for this patient is INPATIENT. Inpatient status is judged to be reasonable and necessary in order to provide the required intensity of service to ensure the patient's safety. The patient's presenting symptoms, physical exam findings, and initial radiographic and laboratory data in the context of their chronic comorbidities is felt to place them at high risk for further clinical deterioration. Furthermore, it is not anticipated that the patient will be medically stable for discharge from the hospital  within 2 midnights of admission.   * I certify that at the point of admission it is my clinical judgment that the patient will require inpatient hospital care spanning beyond 2 midnights from the point of admission due to high intensity of service, high risk for further deterioration and high frequency of surveillance required.*    Ramiro Harvestaniel Chezney Huether MD [facility]  How to contact the [facility] Attending or Consulting provider 7A - 7P or covering provider during after hours 7P -7A, for this patient?   Check the care team in [facility] and look for a) attending/consulting [facility] provider listed and b) the [facility] team listed Log into [email or web address] and use [place]'s universal password to access. If you do not have the password, please contact the hospital operator. Locate the [facility] provider you are looking for under [facility] and page to a number that you can be directly reached. If you still have difficulty reaching the provider, please page the [facility] (Director on Call) for the Hospitalists listed on amion for assistance.  03/15/2021, 5:49 PM

## 2021-03-16 DIAGNOSIS — E876 Hypokalemia: Secondary | ICD-10-CM

## 2021-03-16 LAB — CBC WITH DIFFERENTIAL/PLATELET
Abs Immature Granulocytes: 0.12 10*3/uL — ABNORMAL HIGH (ref 0.00–0.07)
Basophils Absolute: 0 10*3/uL (ref 0.0–0.1)
Basophils Relative: 0 %
Eosinophils Absolute: 0 10*3/uL (ref 0.0–0.5)
Eosinophils Relative: 0 %
HCT: 41.6 % (ref 39.0–52.0)
Hemoglobin: 14.7 g/dL (ref 13.0–17.0)
Immature Granulocytes: 1 %
Lymphocytes Relative: 7 %
Lymphs Abs: 1.6 10*3/uL (ref 0.7–4.0)
MCH: 29.6 pg (ref 26.0–34.0)
MCHC: 35.3 g/dL (ref 30.0–36.0)
MCV: 83.7 fL (ref 80.0–100.0)
Monocytes Absolute: 1.8 10*3/uL — ABNORMAL HIGH (ref 0.1–1.0)
Monocytes Relative: 9 %
Neutro Abs: 17.8 10*3/uL — ABNORMAL HIGH (ref 1.7–7.7)
Neutrophils Relative %: 83 %
Platelets: 352 10*3/uL (ref 150–400)
RBC: 4.97 MIL/uL (ref 4.22–5.81)
RDW: 12.6 % (ref 11.5–15.5)
WBC: 21.3 10*3/uL — ABNORMAL HIGH (ref 4.0–10.5)
nRBC: 0 % (ref 0.0–0.2)

## 2021-03-16 LAB — GLUCOSE, CAPILLARY
Glucose-Capillary: 105 mg/dL — ABNORMAL HIGH (ref 70–99)
Glucose-Capillary: 137 mg/dL — ABNORMAL HIGH (ref 70–99)
Glucose-Capillary: 137 mg/dL — ABNORMAL HIGH (ref 70–99)
Glucose-Capillary: 147 mg/dL — ABNORMAL HIGH (ref 70–99)
Glucose-Capillary: 150 mg/dL — ABNORMAL HIGH (ref 70–99)
Glucose-Capillary: 151 mg/dL — ABNORMAL HIGH (ref 70–99)
Glucose-Capillary: 162 mg/dL — ABNORMAL HIGH (ref 70–99)
Glucose-Capillary: 167 mg/dL — ABNORMAL HIGH (ref 70–99)
Glucose-Capillary: 170 mg/dL — ABNORMAL HIGH (ref 70–99)
Glucose-Capillary: 81 mg/dL (ref 70–99)
Glucose-Capillary: 92 mg/dL (ref 70–99)

## 2021-03-16 LAB — BETA-HYDROXYBUTYRIC ACID: Beta-Hydroxybutyric Acid: 0.61 mmol/L — ABNORMAL HIGH (ref 0.05–0.27)

## 2021-03-16 LAB — BASIC METABOLIC PANEL
Anion gap: 7 (ref 5–15)
Anion gap: 8 (ref 5–15)
BUN: 21 mg/dL — ABNORMAL HIGH (ref 6–20)
BUN: 18 mg/dL (ref 6–20)
CO2: 17 mmol/L — ABNORMAL LOW (ref 22–32)
CO2: 20 mmol/L — ABNORMAL LOW (ref 22–32)
Calcium: 9.1 mg/dL (ref 8.9–10.3)
Calcium: 8.8 mg/dL — ABNORMAL LOW (ref 8.9–10.3)
Chloride: 109 mmol/L (ref 98–111)
Chloride: 104 mmol/L (ref 98–111)
Creatinine, Ser: 1 mg/dL (ref 0.61–1.24)
Creatinine, Ser: 0.89 mg/dL (ref 0.61–1.24)
GFR, Estimated: >60 mL/min (ref >60)
GFR, Estimated: >60 mL/min (ref >60)
Glucose, Bld: 174 mg/dL — ABNORMAL HIGH (ref 70–99)
Glucose, Bld: 141 mg/dL — ABNORMAL HIGH (ref 70–99)
Potassium: 3.8 mmol/L (ref 3.5–5.1)
Potassium: 3.4 mmol/L — ABNORMAL LOW (ref 3.5–5.1)
Sodium: 133 mmol/L — ABNORMAL LOW (ref 135–145)
Sodium: 132 mmol/L — ABNORMAL LOW (ref 135–145)

## 2021-03-16 LAB — HEMOGLOBIN A1C
Hgb A1c MFr Bld: 7.7 % — ABNORMAL HIGH (ref 4.8–5.6)
Mean Plasma Glucose: 174.29 mg/dL

## 2021-03-16 LAB — PHOSPHORUS: Phosphorus: 3.4 mg/dL (ref 2.5–4.6)

## 2021-03-16 MED ORDER — PHENOL 1.4 % MT LIQD
1.0000 | OROMUCOSAL | Status: DC | PRN
  Administered 2021-03-16: 09:00:00 1 via OROMUCOSAL
  Filled 2021-03-16: qty 177

## 2021-03-16 MED ORDER — POTASSIUM CHLORIDE CRYS ER 20 MEQ PO TBCR: 40.0000 meq | EXTENDED_RELEASE_TABLET | Freq: Once | ORAL | Status: DC

## 2021-03-16 MED ORDER — INSULIN ASPART 100 UNIT/ML IJ SOLN
0.0000 [IU] | INTRAMUSCULAR | Status: DC
  Administered 2021-03-16: 2 [IU] via SUBCUTANEOUS
  Administered 2021-03-16: 3 [IU] via SUBCUTANEOUS
  Administered 2021-03-16: 0 [IU] via SUBCUTANEOUS
  Administered 2021-03-17: 3 [IU] via SUBCUTANEOUS

## 2021-03-16 MED ORDER — INSULIN GLARGINE-YFGN 100 UNIT/ML ~~LOC~~ SOLN
31.0000 [IU] | SUBCUTANEOUS | Status: DC
  Administered 2021-03-16: 31 [IU] via SUBCUTANEOUS
  Filled 2021-03-16 (×2): qty 0.31

## 2021-03-16 MED ORDER — POTASSIUM CHLORIDE CRYS ER 10 MEQ PO TBCR
40.0000 meq | EXTENDED_RELEASE_TABLET | Freq: Once | ORAL | Status: AC
  Administered 2021-03-16: 40 meq via ORAL
  Filled 2021-03-16: qty 4

## 2021-03-16 MED ORDER — SODIUM CHLORIDE 0.9 % IV SOLN: INTRAVENOUS | Status: DC

## 2021-03-16 MED ORDER — MAGIC MOUTHWASH W/LIDOCAINE
15.0000 mL | Freq: Four times a day (QID) | ORAL | Status: DC | PRN
  Administered 2021-03-16 – 2021-03-18 (×4): 15 mL via ORAL
  Filled 2021-03-16 (×6): qty 15

## 2021-03-16 MED ORDER — SUCRALFATE 1 GM/10ML PO SUSP
1.0000 g | Freq: Four times a day (QID) | ORAL | Status: DC
  Administered 2021-03-16 – 2021-03-19 (×11): 1 g via ORAL
  Filled 2021-03-16 (×11): qty 10

## 2021-03-16 NOTE — Progress Notes (Signed)
Pt has black device to left ankle

## 2021-03-16 NOTE — Progress Notes (Signed)
°  Transition of Care [facility]) Screening Note   Patient Details  Name: Logan Washington Date of Birth: [birth year]   Transition of Care [facility]) CM/SW Contact:    Lennart Pall, LCSW Phone Number: 03/16/2021, 9:58 AM    Transition of Care Department [facility]) has reviewed patient and no TOC needs have been identified at this time. We will continue to monitor patient advancement through interdisciplinary progression rounds. If new patient transition needs arise, please place a TOC consult.

## 2021-03-16 NOTE — Progress Notes (Signed)
PROGRESS NOTE    [name]  UVO:[id number] DOB: 10-23-2000 DOA: 03/15/2021 PCP: Logan Washington, No Pcp Per (Inactive)    Chief Complaint  Logan Washington presents with   Nausea   Emesis    Brief Narrative:  Logan Washington 21 year old gentleman history of type 1 diabetes, recently incarcerated for 5 days for assault charges stated did not receive any insulin while in jail presented to the ED with a 4-day history of nausea emesis, epigastric abdominal pain with some bouts of hematemesis.  Logan Washington noted to be in DKA.  Logan Washington placed on the Endo tool, IV fluids, bowel rest   Assessment & Plan:   Principal Problem:   DKA (diabetic ketoacidosis) (HCC) Active Problems:   DKA, type 1 (HCC)   AKI (acute kidney injury) (HCC)   Dehydration   Hematemesis   Leukocytosis   Hyperkalemia  #1 DKA/type I diabetic -Logan Washington presenting in DKA secondary to medication noncompliance as he was incarcerated in jail and stated did not receive any of his insulin. -Logan Washington on admission noted to have a blood glucose level of 409 on basic metabolic profile, noted to be acidotic with a bicarb of 9, potassium of 5.4, anion gap of 19.  Beta hydroxybutyrate was elevated at > 8.  -Urinalysis with glycosuria and ketonuria. -Urinalysis nitrite negative, leukocytes negative.  Chest x-ray pending. -Hemoglobin A1c at 7.7 -Logan Washington was started on the Endo tool/insulin drip in the ED which was continued.  -Anion gap is closed.   -Acidosis with significant improvement with bicarb currently at 20.   -CBG 137 this morning.   -Beta hydroxybutyrate trending down.   -We will transition from insulin drip to home regimen of subcutaneous Semglee at 31 units daily.   -SSI.   -Advance diet to a carb modified diet.   -Check CBGs every 4 hours for another 24 hours.   -IV fluids.   -Diabetes coordinator will be following.  2.  Hematemesis/hemoptysis -Logan Washington with complaints of hematemesis prior to admission however no hematemesis noted in the  ED. -Logan Washington complain of some abdominal pain, esophageal pain likely secondary to nausea and emesis. -Logan Washington noted to have EGD done 12/28/2019 which showed LA grade a reflux esophagitis with gastritis. -Logan Washington noted not to be compliant with PPI after upper endoscopy stating symptoms had improved and had resolved. -Hematemesis could be secondary to Mallory-Weiss tear. -Logan Washington with no further hematemesis. -Continue PPI every 12 hours while in-house and likely discharge on PPI daily. -Follow H&H. -Supportive care.  3.  Dehydration -Noted on lab work with elevated hemoglobin levels, thrombocytosis, leukocytosis -IV fluids.  4.  Leukocytosis Likely a reactive leukocytosis secondary to problem #1 and 2 and #3. -Logan Washington afebrile. -Urinalysis nitrite negative leukocytes negative. -Chest x-ray with no acute infiltrate.. -Leukocytosis trending down.   -Continue to hold off on antibiotics.  -Supportive care.  5.  History of esophagitis/gastritis -Continue PPI.   6.  Hyperkalemia>>> hypokalemia -Secondary to problem #1. -Hyperkalemia improved, Logan Washington with potassium of 3.4 this morning which we will replete.   -Follow.  7.  Acute kidney injury -Likely secondary to prerenal azotemia from GI losses. -Improved with hydration. -Continue IV fluids for another 24 hours.   DVT prophylaxis: SCDs Code Status: Full Family Communication: Updated Logan Washington.  No family at bedside Disposition:   Status is: Inpatient Remains inpatient appropriate because: Severity of illness           Consultants:  Diabetes coordinator  Procedures:  Chest x-ray 03/15/2021  Antimicrobials:  None   Subjective: Logan Washington overall feeling better.  No further hematemesis.  No chest pain.  No shortness of breath.  Tolerated noncaloric beverages.  Complaining of throat pain.  Stated was post to be on a PPI however due to improvement with abdominal pain never obtained it.  Objective: Vitals:   03/16/21  0400 03/16/21 0724 03/16/21 0802 03/16/21 0811  BP: 113/82   136/80  Pulse: 90 69 77 85  Resp: 16 12 (!) 25 14  Temp: 98.3 F (36.8 C)  98.2 F (36.8 C)   TempSrc: Oral  Oral   SpO2: 100% 100% 99% 100%    Intake/Output Summary (Last 24 hours) at 03/16/2021 1004 Last data filed at 03/16/2021 0724 Gross per 24 hour  Intake 4606.09 ml  Output 1450 ml  Net 3156.09 ml   There were no vitals filed for this visit.  Examination:  General exam: Appears calm and comfortable  Respiratory system: Clear to auscultation. Respiratory effort normal. Cardiovascular system: S1 & S2 heard, RRR. No JVD, murmurs, rubs, gallops or clicks. No pedal edema. Gastrointestinal system: Abdomen is nondistended, soft and nontender. No organomegaly or masses felt. Normal bowel sounds heard. Central nervous system: Alert and oriented. No focal neurological deficits. Extremities: Symmetric 5 x 5 power. Skin: No rashes, lesions or ulcers Psychiatry: Judgement and insight appear normal. Mood & affect appropriate.     Data Reviewed: I have personally reviewed following labs and imaging studies  CBC: Recent Labs  Lab 03/15/21 1334 03/16/21 0517  WBC 28.7* 21.3*  NEUTROABS 24.5* 17.8*  HGB 17.6* 14.7  HCT 52.7* 41.6  MCV 87.1 83.7  PLT 446* 352    Basic Metabolic Panel: Recent Labs  Lab 03/15/21 1334 03/15/21 1438 03/15/21 1923 03/15/21 2110 03/16/21 0041 03/16/21 0517  NA 131* 135  --  132* 133* 132*  K 5.4* 4.5  --  4.1 3.8 3.4*  CL 103 108  --  109 109 104  CO2 9* 12*  --  12* 17* 20*  GLUCOSE 409* 188*  --  206* 174* 141*  BUN 31* 28*  --  22* 21* 18  CREATININE 1.50* 1.32*  --  1.05 1.00 0.89  CALCIUM 9.7 10.2  --  8.9 9.1 8.8*  MG  --   --  1.9  --   --   --   PHOS  --   --  1.5*  --   --  3.4    GFR: CrCl cannot be calculated (Unknown ideal weight.).  Liver Function Tests: No results for input(s): AST, ALT, ALKPHOS, BILITOT, PROT, ALBUMIN in the last 168 hours.  CBG: Recent  Labs  Lab 03/16/21 0205 03/16/21 0400 03/16/21 0613 03/16/21 0810 03/16/21 0916  GLUCAP 162* 151* 147* 137* 137*     Recent Results (from the past 240 hour(s))  Resp Panel by RT-PCR (Flu A&B, Covid) Nasopharyngeal Swab     Status: None   Collection Time: 03/15/21  1:34 PM   Specimen: Nasopharyngeal Swab; Nasopharyngeal(NP) swabs in vial transport medium  Result Value Ref Range Status   SARS Coronavirus 2 by RT PCR NEGATIVE NEGATIVE Final    Comment: (NOTE) SARS-CoV-2 target nucleic acids are NOT DETECTED.  The SARS-CoV-2 RNA is generally detectable in upper respiratory specimens during the acute phase of infection. The lowest concentration of SARS-CoV-2 viral copies this assay can detect is 138 copies/mL. A negative result does not preclude SARS-Cov-2 infection and should not be used as the sole basis for treatment or other Logan Washington management decisions. A negative result may occur with  improper specimen collection/handling, submission of specimen other than nasopharyngeal swab, presence of viral mutation(s) within the areas targeted by this assay, and inadequate number of viral copies(<138 copies/mL). A negative result must be combined with clinical observations, Logan Washington history, and epidemiological information. The expected result is Negative.  Fact Sheet for Patients:  BloggerCourse.comhttps://www.fda.gov/media/152166/download  Fact Sheet for Healthcare Providers:  SeriousBroker.ithttps://www.fda.gov/media/152162/download  This test is no t yet approved or cleared by the [place] FDA and  has been authorized for detection and/or diagnosis of SARS-CoV-2 by FDA under an Emergency Use Authorization (EUA). This EUA will remain  in effect (meaning this test can be used) for the duration of the COVID-19 declaration under Section 564(b)(1) of the Act, 21 U.S.C.section 360bbb-3(b)(1), unless the authorization is terminated  or revoked sooner.       Influenza A by PCR NEGATIVE NEGATIVE Final    Influenza B by PCR NEGATIVE NEGATIVE Final    Comment: (NOTE) The Xpert Xpress SARS-CoV-2/FLU/RSV plus assay is intended as an aid in the diagnosis of influenza from Nasopharyngeal swab specimens and should not be used as a sole basis for treatment. Nasal washings and aspirates are unacceptable for Xpert Xpress SARS-CoV-2/FLU/RSV testing.  Fact Sheet for Patients: BloggerCourse.comhttps://www.fda.gov/media/152166/download  Fact Sheet for Healthcare Providers: SeriousBroker.ithttps://www.fda.gov/media/152162/download  This test is not yet approved or cleared by the [place] FDA and has been authorized for detection and/or diagnosis of SARS-CoV-2 by FDA under an Emergency Use Authorization (EUA). This EUA will remain in effect (meaning this test can be used) for the duration of the COVID-19 declaration under Section 564(b)(1) of the Act, 21 U.S.C. section 360bbb-3(b)(1), unless the authorization is terminated or revoked.  Performed at [facility], [address] [place], [place] [address]   MRSA Next Gen by PCR, Nasal     Status: None   Collection Time: 03/15/21  6:30 PM   Specimen: Nasal Mucosa; Nasal Swab  Result Value Ref Range Status   MRSA by PCR Next Gen NOT DETECTED NOT DETECTED Final    Comment: (NOTE) The GeneXpert MRSA Assay (FDA approved for NASAL specimens only), is one component of a comprehensive MRSA colonization surveillance program. It is not intended to diagnose MRSA infection nor to guide or monitor treatment for MRSA infections. Test performance is not FDA approved in patients less than 23 years old. Performed at [facility], [address] [place], [place] [address]          Radiology Studies: DG Chest 2 View  Result Date: 03/15/2021 CLINICAL DATA:  DKA EXAM: CHEST - 2 VIEW COMPARISON:  November 2020 FINDINGS: The heart size and mediastinal contours are within normal limits. Both lungs are clear. No pleural effusion or pneumothorax. The  visualized skeletal structures are unremarkable. IMPRESSION: No acute process in the chest. Electronically Signed   By: Guadlupe SpanishPraneil  Patel M.D.   On: 03/15/2021 18:09        Scheduled Meds:  Chlorhexidine Gluconate Cloth  6 each Topical Daily   dicyclomine  20 mg Oral TID AC & HS   insulin aspart  0-15 Units Subcutaneous Q4H   insulin glargine-yfgn  31 Units Subcutaneous Q24H   pantoprazole (PROTONIX) IV  40 mg Intravenous Q12H   Continuous Infusions:  sodium chloride     dextrose 5% lactated ringers 125 mL/hr at 03/16/21 0724   insulin 1.8 Units/hr (03/16/21 0724)   lactated ringers     lactated ringers       LOS: 1 day  Time spent: 40 minutes    Ramiro Harvest, MD [facility]   To contact the attending provider between 7A-7P or the covering provider during after hours 7P-7A, please log into the web site [email or web address] and access using universal [place] password for that web site. If you do not have the password, please call the hospital operator.  03/16/2021, 10:04 AM

## 2021-03-16 NOTE — Progress Notes (Addendum)
Inpatient Diabetes Program Recommendations  AACE/ADA: New Consensus Statement on Inpatient Glycemic Control (2015)  Target Ranges:  Prepandial:   less than 140 mg/dL      Peak postprandial:   less than 180 mg/dL (1-2 hours)      Critically ill patients:  140 - 180 mg/dL   Lab Results  Component Value Date   GLUCAP 147 (H) 03/16/2021   HGBA1C 7.7 (H) 03/15/2021    Review of Glycemic Control  Diabetes history: DM1 Outpatient Diabetes medications: Lantus 31 units QHS, Novolog 6-7 units TID (I/C 1:7) Current orders for Inpatient glycemic control: IV insulin per EndoTool for DKA  HgbA1C - 7.7%  Needs tighter control with young age Almost ready to transition to SQ insulin.  Inpatient Diabetes Program Recommendations:    Give Semglee 1-2H prior to discontinuation of drip. Semglee 24 units Q24H Novolog 0-9 units Q4 X 12H, then TID with meals and 0-5 HS Novolog 6 units TID with meals (pt can count CHOs)  Will speak with pt at bedside this am about his diabetes and importance of f/u with Endo  Thank you. Ailene Ards, RD, LDN, CDE Inpatient Diabetes Coordinator [phone]   Addendum:  Spoke with pt at bedside about his diabetes control. States it's been about a year since he's seen his Endo and will need to make an appt. Said last HgbA1C was > 10% and he's lowered it to 7.7% by eating better and consistent blood sugar monitoring. Counts CHOs and uses a 1:7 CHO ratio for his Novolog. Also takes Lantus 31 units QHS. States he does not miss any doses. Went into DKA when in custody at jail, states the nurse would not give him any insulin, even though she checked blood sugar and it was > 500. States he's had DM1 since age 21. Had no other questions about his diabetes. Strongly encouraged pt to make appt with Endo. With young age, would try to get HgbA1C down to 7%.  Continue to follow.  Thank you. Ailene Ards, RD, LDN, CDE Inpatient Diabetes Coordinator [phone]

## 2021-03-17 ENCOUNTER — Encounter (HOSPITAL_COMMUNITY): Payer: Self-pay | Admitting: Internal Medicine

## 2021-03-17 DIAGNOSIS — R1310 Dysphagia, unspecified: Secondary | ICD-10-CM

## 2021-03-17 LAB — CBC WITH DIFFERENTIAL/PLATELET
Abs Immature Granulocytes: 0.03 10*3/uL (ref 0.00–0.07)
Basophils Absolute: 0 10*3/uL (ref 0.0–0.1)
Basophils Relative: 0 %
Eosinophils Absolute: 0 10*3/uL (ref 0.0–0.5)
Eosinophils Relative: 0 %
HCT: 39.1 % (ref 39.0–52.0)
Hemoglobin: 13.7 g/dL (ref 13.0–17.0)
Immature Granulocytes: 0 %
Lymphocytes Relative: 20 %
Lymphs Abs: 2.2 10*3/uL (ref 0.7–4.0)
MCH: 29.7 pg (ref 26.0–34.0)
MCHC: 35 g/dL (ref 30.0–36.0)
MCV: 84.6 fL (ref 80.0–100.0)
Monocytes Absolute: 1 10*3/uL (ref 0.1–1.0)
Monocytes Relative: 9 %
Neutro Abs: 7.6 10*3/uL (ref 1.7–7.7)
Neutrophils Relative %: 71 %
Platelets: 315 10*3/uL (ref 150–400)
RBC: 4.62 MIL/uL (ref 4.22–5.81)
RDW: 12.6 % (ref 11.5–15.5)
WBC: 10.8 10*3/uL — ABNORMAL HIGH (ref 4.0–10.5)
nRBC: 0 % (ref 0.0–0.2)

## 2021-03-17 LAB — BASIC METABOLIC PANEL
Anion gap: 7 (ref 5–15)
BUN: 9 mg/dL (ref 6–20)
CO2: 24 mmol/L (ref 22–32)
Calcium: 8.3 mg/dL — ABNORMAL LOW (ref 8.9–10.3)
Chloride: 101 mmol/L (ref 98–111)
Creatinine, Ser: 0.9 mg/dL (ref 0.61–1.24)
GFR, Estimated: >60 mL/min (ref >60)
Glucose, Bld: 104 mg/dL — ABNORMAL HIGH (ref 70–99)
Potassium: 2.9 mmol/L — ABNORMAL LOW (ref 3.5–5.1)
Sodium: 132 mmol/L — ABNORMAL LOW (ref 135–145)

## 2021-03-17 LAB — GLUCOSE, CAPILLARY
Glucose-Capillary: 102 mg/dL — ABNORMAL HIGH (ref 70–99)
Glucose-Capillary: 118 mg/dL — ABNORMAL HIGH (ref 70–99)
Glucose-Capillary: 129 mg/dL — ABNORMAL HIGH (ref 70–99)
Glucose-Capillary: 151 mg/dL — ABNORMAL HIGH (ref 70–99)
Glucose-Capillary: 212 mg/dL — ABNORMAL HIGH (ref 70–99)
Glucose-Capillary: 95 mg/dL (ref 70–99)

## 2021-03-17 LAB — MAGNESIUM: Magnesium: 1.9 mg/dL (ref 1.7–2.4)

## 2021-03-17 MED ORDER — POTASSIUM CHLORIDE 20 MEQ PO PACK: 40.0000 meq | PACK | ORAL | Status: DC

## 2021-03-17 MED ORDER — INSULIN GLARGINE-YFGN 100 UNIT/ML ~~LOC~~ SOLN
25.0000 [IU] | SUBCUTANEOUS | Status: DC
  Administered 2021-03-17: 25 [IU] via SUBCUTANEOUS
  Filled 2021-03-17: qty 0.25

## 2021-03-17 MED ORDER — BOOST / RESOURCE BREEZE PO LIQD CUSTOM
1.0000 | Freq: Three times a day (TID) | ORAL | Status: DC
  Administered 2021-03-17 – 2021-03-19 (×4): 1 via ORAL

## 2021-03-17 MED ORDER — POTASSIUM CHLORIDE CRYS ER 20 MEQ PO TBCR
40.0000 meq | EXTENDED_RELEASE_TABLET | ORAL | Status: DC
  Filled 2021-03-17: qty 2

## 2021-03-17 MED ORDER — ADULT MULTIVITAMIN W/MINERALS CH
1.0000 | ORAL_TABLET | Freq: Every day | ORAL | Status: DC
  Administered 2021-03-17 – 2021-03-19 (×2): 1 via ORAL
  Filled 2021-03-17 (×3): qty 1

## 2021-03-17 MED ORDER — PROSOURCE PLUS PO LIQD
30.0000 mL | Freq: Two times a day (BID) | ORAL | Status: DC
  Administered 2021-03-18 – 2021-03-19 (×2): 30 mL via ORAL
  Filled 2021-03-17 (×2): qty 30

## 2021-03-17 MED ORDER — INSULIN ASPART 100 UNIT/ML IJ SOLN
0.0000 [IU] | Freq: Three times a day (TID) | INTRAMUSCULAR | Status: DC
  Administered 2021-03-17: 12:00:00 2 [IU] via SUBCUTANEOUS
  Administered 2021-03-18 – 2021-03-19 (×3): 5 [IU] via SUBCUTANEOUS
  Administered 2021-03-19: 11 [IU] via SUBCUTANEOUS

## 2021-03-17 MED ORDER — POTASSIUM CHLORIDE CRYS ER 10 MEQ PO TBCR
40.0000 meq | EXTENDED_RELEASE_TABLET | ORAL | Status: AC
  Administered 2021-03-17 (×2): 40 meq via ORAL
  Filled 2021-03-17 (×2): qty 4

## 2021-03-17 MED ORDER — INSULIN GLARGINE-YFGN 100 UNIT/ML ~~LOC~~ SOLN
12.0000 [IU] | SUBCUTANEOUS | Status: DC
  Administered 2021-03-18: 12 [IU] via SUBCUTANEOUS
  Filled 2021-03-17 (×2): qty 0.12

## 2021-03-17 NOTE — Progress Notes (Addendum)
PROGRESS NOTE    Logan Washington  [id number] DOB: [birth year] DOA: 03/15/2021 PCP: Patient, No Pcp Per (Inactive)    Chief Complaint  Patient presents with   Nausea   Emesis    Brief Narrative:  Patient 21 year old gentleman history of type 1 diabetes, recently incarcerated for 5 days for assault charges stated did not receive any insulin while in jail presented to the ED with a 4-day history of nausea emesis, epigastric abdominal pain with some bouts of hematemesis.  Patient noted to be in DKA.  Patient placed on the Endo tool, IV fluids, bowel rest.  DKA resolved patient transition to subcutaneous insulin.  Patient with complaints of a dyne aphasia and GI consulted.   Assessment & Plan:   Principal Problem:   DKA (diabetic ketoacidosis) ([facility]) Active Problems:   DKA, type 1 ([place])   AKI (acute kidney injury) ([place])   Dehydration   Hematemesis   Leukocytosis   Hyperkalemia   Hypokalemia   Odynophagia  #1 DKA/type I diabetic -Patient presenting in DKA secondary to medication noncompliance as he was incarcerated in jail and stated did not receive any of his insulin. -Patient on admission noted to have a blood glucose level of [id number] on basic metabolic profile, noted to be acidotic with a bicarb of 9, potassium of 5.4, anion gap of 19.  Beta hydroxybutyrate was elevated at > 8.  -Urinalysis with glycosuria and ketonuria. -Urinalysis nitrite negative, leukocytes negative.  Chest x-ray pending. -Hemoglobin A1c at 7.7 -Patient was started on the Endo tool/insulin drip in the ED which was continued.  -Anion gap is closed.   -Acidosis with significant improvement with bicarb currently at 24.   -CBG 118 this morning.   -Beta hydroxybutyrate trended down.   -Patient has been transition from insulin drip back to home regimen of Semglee 31 units daily.   -Patient currently on clear liquids with CBG of 118 this morning.   -Decrease Semglee to 25 units daily.   -Continue SSI.   -Change  CBGs to ACHS -Saline lock IV fluids.   -Diabetes coordinator following.   2.  Hematemesis/hemoptysis -Patient with complaints of hematemesis prior to admission however no hematemesis noted in the ED. -Patient complain of some abdominal pain, esophageal pain likely secondary to nausea and emesis. -Patient noted to have EGD done 12/28/2019 which showed LA grade a reflux esophagitis with gastritis. -Patient noted not to be compliant with PPI after upper endoscopy stating symptoms had improved and had resolved. -Hematemesis could be secondary to Mallory-Weiss tear. -Patient with no further hematemesis. -Continue IV PPI twice daily.   -Patient with complaints of odynophagia, GI consultation pending.   -Continue Carafate.  -Supportive care.   -Follow H&H.   3.  Dehydration -Noted on lab work with elevated hemoglobin levels, thrombocytosis, leukocytosis -Improved with hydration. -Saline lock IV fluids.  4.  Leukocytosis Likely a reactive leukocytosis secondary to problem #1 and 2 and #3. -Patient afebrile. -Urinalysis nitrite negative leukocytes negative. -Chest x-ray with no acute infiltrate.. -Leukocytosis trending down.   -Continue to hold off on antibiotics.  -Supportive care.  5.  Odynophagia -Patient with complaints of upper throat pain, difficulty swallowing food yesterday and as such diet downgraded to clear liquids which he is tolerating. -Patient refused throat may be closing. -Likely secondary to emesis, hematemesis prior to admission. -No exudate noted on oral examination. -Continue Carafate, IV PPI. -Discontinue supple call lozenges, Magic mouthwash with lidocaine. -GI consultation pending for further evaluation and management.  6.  History of  esophagitis/gastritis -Patient with some complaints of odynophagia with food.  Currently tolerating clears. -Continue IV PPI.  7.  Hyperkalemia>>> hypokalemia -Secondary to problem #1. -Hyperkalemia improved, patient with  potassium of 2.9 this morning. -K-Dur 40 mEq p.o. every 4 hours x2 doses. -Follow.  8.  Acute kidney injury -Likely secondary to prerenal azotemia from GI losses. -Improved with hydration. -Saline lock IV fluids.   DVT prophylaxis: SCDs Code Status: Full Family Communication: Updated patient.  No family at bedside Disposition:   Status is: Inpatient Remains inpatient appropriate because: Severity of illness           Consultants:  Diabetes coordinator GI pending  Procedures:  Chest x-ray 03/15/2021  Antimicrobials:  None   Subjective: Sitting up in bed.  Tolerating clears.  States still with upper throat pain which occurred with food.  No further hematemesis.  No nausea or emesis.  No abdominal pain.  States Cepacol lozenges did not help with pain, Magic mouthwash with lidocaine did not help with pain, slight improvement of pain with Carafate.  No chest pain.    Objective: Vitals:   03/17/21 0825 03/17/21 0830 03/17/21 0835 03/17/21 0840  BP: (!) 169/92     Pulse: 76 80 70 85  Resp: 19 16 16 20   Temp:      TempSrc:      SpO2: 100% 100% 100% 100%  Weight:      Height:        Intake/Output Summary (Last 24 hours) at 03/17/2021 0937 Last data filed at 03/17/2021 0840 Gross per 24 hour  Intake 3038.35 ml  Output --  Net 3038.35 ml    Filed Weights   03/16/21 1720  Weight: 68.7 kg    Examination:  General exam: NAD. Oropharynx: Oropharynx is clear, no exudate noted, slight erythema in the posterior pharynx. Respiratory system: CTA B.  No wheezes, no crackles, no rhonchi.  Normal respiratory effort. Cardiovascular system: Regular rate rhythm no murmurs rubs or gallops.  No JVD.  No lower extremity edema.  Gastrointestinal system: Abdomen is soft, nontender, nondistended, positive bowel sounds.  No rebound.  No guarding.  Central nervous system: Alert and oriented. No focal neurological deficits. Extremities: Symmetric 5 x 5 power.  Home monitoring device  on left ankle. Skin: No rashes, lesions or ulcers Psychiatry: Judgement and insight appear normal. Mood & affect appropriate.     Data Reviewed: I have personally reviewed following labs and imaging studies  CBC: Recent Labs  Lab 03/15/21 1334 03/16/21 0517 03/17/21 0242  WBC 28.7* 21.3* 10.8*  NEUTROABS 24.5* 17.8* 7.6  HGB 17.6* 14.7 13.7  HCT 52.7* 41.6 39.1  MCV 87.1 83.7 84.6  PLT 446* 352 315     Basic Metabolic Panel: Recent Labs  Lab 03/15/21 1438 03/15/21 1923 03/15/21 2110 03/16/21 0041 03/16/21 0517 03/17/21 0242  NA 135  --  132* 133* 132* 132*  K 4.5  --  4.1 3.8 3.4* 2.9*  CL 108  --  109 109 104 101  CO2 12*  --  12* 17* 20* 24  GLUCOSE 188*  --  206* 174* 141* 104*  BUN 28*  --  22* 21* 18 9  CREATININE 1.32*  --  1.05 1.00 0.89 0.90  CALCIUM 10.2  --  8.9 9.1 8.8* 8.3*  MG  --  1.9  --   --   --   --   PHOS  --  1.5*  --   --  3.4  --  GFR: Estimated Creatinine Clearance: 125.1 mL/min (by C-G formula based on SCr of 0.9 mg/dL).  Liver Function Tests: No results for input(s): AST, ALT, ALKPHOS, BILITOT, PROT, ALBUMIN in the last 168 hours.  CBG: Recent Labs  Lab 03/16/21 1705 03/16/21 2007 03/16/21 2335 03/17/21 0345 03/17/21 0752  GLUCAP 167* 81 105* 118* 151*      Recent Results (from the past 240 hour(s))  Resp Panel by RT-PCR (Flu A&B, Covid) Nasopharyngeal Swab     Status: None   Collection Time: 03/15/21  1:34 PM   Specimen: Nasopharyngeal Swab; Nasopharyngeal(NP) swabs in vial transport medium  Result Value Ref Range Status   SARS Coronavirus 2 by RT PCR NEGATIVE NEGATIVE Final    Comment: (NOTE) SARS-CoV-2 target nucleic acids are NOT DETECTED.  The SARS-CoV-2 RNA is generally detectable in upper respiratory specimens during the acute phase of infection. The lowest concentration of SARS-CoV-2 viral copies this assay can detect is 138 copies/mL. A negative result does not preclude SARS-Cov-2 infection and should  not be used as the sole basis for treatment or other patient management decisions. A negative result may occur with  improper specimen collection/handling, submission of specimen other than nasopharyngeal swab, presence of viral mutation(s) within the areas targeted by this assay, and inadequate number of viral copies(<138 copies/mL). A negative result must be combined with clinical observations, patient history, and epidemiological information. The expected result is Negative.  Fact Sheet for Patients:  EntrepreneurPulse.com.au  Fact Sheet for Healthcare Providers:  IncredibleEmployment.be  This test is no t yet approved or cleared by the [place] FDA and  has been authorized for detection and/or diagnosis of SARS-CoV-2 by FDA under an Emergency Use Authorization (EUA). This EUA will remain  in effect (meaning this test can be used) for the duration of the COVID-19 declaration under Section 564(b)(1) of the Act, 21 U.S.C.section 360bbb-3(b)(1), unless the authorization is terminated  or revoked sooner.       Influenza A by PCR NEGATIVE NEGATIVE Final   Influenza B by PCR NEGATIVE NEGATIVE Final    Comment: (NOTE) The Xpert Xpress SARS-CoV-2/FLU/RSV plus assay is intended as an aid in the diagnosis of influenza from Nasopharyngeal swab specimens and should not be used as a sole basis for treatment. Nasal washings and aspirates are unacceptable for Xpert Xpress SARS-CoV-2/FLU/RSV testing.  Fact Sheet for Patients: EntrepreneurPulse.com.au  Fact Sheet for Healthcare Providers: IncredibleEmployment.be  This test is not yet approved or cleared by the [place] FDA and has been authorized for detection and/or diagnosis of SARS-CoV-2 by FDA under an Emergency Use Authorization (EUA). This EUA will remain in effect (meaning this test can be used) for the duration of the COVID-19 declaration under  Section 564(b)(1) of the Act, 21 U.S.C. section 360bbb-3(b)(1), unless the authorization is terminated or revoked.  Performed at [facility], [place] [address] [place], [place] [address]   MRSA Next Gen by PCR, Nasal     Status: None   Collection Time: 03/15/21  6:30 PM   Specimen: Nasal Mucosa; Nasal Swab  Result Value Ref Range Status   MRSA by PCR Next Gen NOT DETECTED NOT DETECTED Final    Comment: (NOTE) The GeneXpert MRSA Assay (FDA approved for NASAL specimens only), is one component of a comprehensive MRSA colonization surveillance program. It is not intended to diagnose MRSA infection nor to guide or monitor treatment for MRSA infections. Test performance is not FDA approved in patients less than 38 years old. Performed at [facility]  [place] [address] [place], [place] [address]           Radiology Studies: DG Chest 2 View  Result Date: 03/15/2021 CLINICAL DATA:  DKA EXAM: CHEST - 2 VIEW COMPARISON:  November 2020 FINDINGS: The heart size and mediastinal contours are within normal limits. Both lungs are clear. No pleural effusion or pneumothorax. The visualized skeletal structures are unremarkable. IMPRESSION: No acute process in the chest. Electronically Signed   By: Macy Mis M.D.   On: 03/15/2021 18:09        Scheduled Meds:  Chlorhexidine Gluconate Cloth  6 each Topical Daily   dicyclomine  20 mg Oral TID AC & HS   insulin aspart  0-15 Units Subcutaneous Q4H   insulin glargine-yfgn  25 Units Subcutaneous Q24H   pantoprazole (PROTONIX) IV  40 mg Intravenous Q12H   potassium chloride  40 mEq Oral Q4H   sucralfate  1 g Oral Q6H   Continuous Infusions:  lactated ringers       LOS: 2 days    Time spent: 40 minutes    Irine Seal, MD [facility]   To contact the attending provider between 7A-7P or the covering provider during after hours 7P-7A, please log into the web site [email or web address] and access  using universal [place] password for that web site. If you do not have the password, please call the hospital operator.  03/17/2021, 9:37 AM

## 2021-03-17 NOTE — Progress Notes (Signed)
Initial Nutrition Assessment  DOCUMENTATION CODES:   Not applicable  INTERVENTION:  Boost Breeze po TID, each supplement provides 250 kcal and 9 grams of protein  PROSource PLUS PO BID, each supplement provides 100 kcals and 15 grams of protein  MVI with minerals daily  NUTRITION DIAGNOSIS:   Inadequate oral intake related to nausea as evidenced by per patient/family report.  GOAL:   Patient will meet greater than or equal to 90% of their needs  MONITOR:   PO intake, Supplement acceptance, Diet advancement, Labs, I & O's, Weight trends  REASON FOR ASSESSMENT:   Malnutrition Screening Tool    ASSESSMENT:   Pt with PMH significant for type 1 DM admitted with DKA 2/2 not receiving insulin x5 days while incarcerated for assault charges.  DKA now resolved per MD  Pt endorses 4 day h/o N/V and abdominal pain with some bouts of hematemesis. Pt now with complaints of upper throat pain and difficulty swallowing foods. GI evaluated pt today -- plan for endoscopy tomorrow if no improvement in sx with further work-up and plan pending those findings.   PO Intake: none documented   UOP: x24 hours I/O: +5518ml since admit  Medications:  Chlorhexidine Gluconate Cloth  6 each Topical Daily   dicyclomine  20 mg Oral TID AC & HS   insulin aspart  0-15 Units Subcutaneous TID WC   insulin glargine-yfgn  25 Units Subcutaneous Q24H   pantoprazole (PROTONIX) IV  40 mg Intravenous Q12H   sucralfate  1 g Oral Q6H   Labs: Recent Labs  Lab 03/15/21 1923 03/15/21 2110 03/16/21 0041 03/16/21 0517 03/17/21 0242 03/17/21 0326  NA  --    < > 133* 132* 132*  --   K  --    < > 3.8 3.4* 2.9*  --   CL  --    < > 109 104 101  --   CO2  --    < > 17* 20* 24  --   BUN  --    < > 21* 18 9  --   CREATININE  --    < > 1.00 0.89 0.90  --   CALCIUM  --    < > 9.1 8.8* 8.3*  --   MG 1.9  --   --   --   --  1.9  PHOS 1.5*  --   --  3.4  --   --   GLUCOSE  --    < > 174* 141* 104*   --    < > = values in this interval not displayed.  CBGs: 102-129-151-118   NUTRITION - FOCUSED PHYSICAL EXAM: Unable to complete at this time. Will attempt at follow-up.  Diet Order:   Diet Order             Diet clear liquid Room service appropriate? Yes; Fluid consistency: Thin  Diet effective now                   EDUCATION NEEDS:   No education needs have been identified at this time  Skin:  Skin Assessment: Reviewed RN Assessment  Last BM:  2/3  Height:   Ht Readings from Last 1 Encounters:  03/16/21 5\' 9"  (1.753 m)    Weight:   Wt Readings from Last 1 Encounters:  03/16/21 68.7 kg    BMI:  Body mass index is 22.37 kg/m.  Estimated Nutritional Needs:   Kcal:  1850-2050  Protein:  90-105  grams  Fluid:  >1.8L/d     Rae Lips., MS, RD, LDN (she/her/hers) RD pager number and weekend/on-call pager number located in Amion.

## 2021-03-17 NOTE — Consult Note (Addendum)
Reason for Consult: Painful swallowing Referring Physician: Hospital team  Logan Washington is an 21 y.o. male.  HPI: Patient seen and examined in hospital computer chart reviewed and he says when he had his endoscopy a year and a half ago by the other team he was having abdominal pain and this problem started in jail 4 days ago after vomiting a few times and he did vomit a little blood but has had pain with swallowing ever since and the Carafate may help a little but none of the other medicines seem to help and again he did not have this problem prior to vomiting and his case discussed with the hospital team and he has no new complaints and a recent CT scan was normal Past Medical History:  Diagnosis Date   Diabetes (HCC)    Diabetes mellitus without complication (HCC)     Past Surgical History:  Procedure Laterality Date   TONSILLECTOMY      Family History  Problem Relation Age of Onset   Breast cancer Mother    Lung cancer Mother    Colon cancer Maternal Grandmother    Sudden death Neg Hx    Heart attack Neg Hx    Stomach cancer Neg Hx    Pancreatic cancer Neg Hx    Esophageal cancer Neg Hx    Rectal cancer Neg Hx     Social History:  reports that he has never smoked. He has never used smokeless tobacco. He reports that he does not drink alcohol and does not use drugs.  Allergies:  Allergies  Allergen Reactions   Tilapia [Fish Allergy] Swelling    Mouth     Medications: I have reviewed the patient's current medications.  Results for orders placed or performed during the hospital encounter of 03/15/21 (from the past 48 hour(s))  Basic metabolic panel     Status: Abnormal   Collection Time: 03/15/21  2:38 PM  Result Value Ref Range   Sodium 135 135 - 145 mmol/L   Potassium 4.5 3.5 - 5.1 mmol/L   Chloride 108 98 - 111 mmol/L   CO2 12 (L) 22 - 32 mmol/L   Glucose, Bld 188 (H) 70 - 99 mg/dL    Comment: Glucose reference range applies only to samples taken after fasting  for at least 8 hours.   BUN 28 (H) 6 - 20 mg/dL   Creatinine, Ser 1.611.32 (H) 0.61 - 1.24 mg/dL   Calcium 09.610.2 8.9 - 04.510.3 mg/dL   GFR, Estimated >40>60 >98>60 mL/min    Comment: (NOTE) Calculated using the CKD-EPI Creatinine Equation (2021)    Anion gap 15 5 - 15    Comment: Performed at [facility], [address] [place], [place] [address]  CBG monitoring, ED     Status: Abnormal   Collection Time: 03/15/21  3:29 PM  Result Value Ref Range   Glucose-Capillary 304 (H) 70 - 99 mg/dL    Comment: Glucose reference range applies only to samples taken after fasting for at least 8 hours.  CBG monitoring, ED     Status: Abnormal   Collection Time: 03/15/21  4:18 PM  Result Value Ref Range   Glucose-Capillary 267 (H) 70 - 99 mg/dL    Comment: Glucose reference range applies only to samples taken after fasting for at least 8 hours.  CBG monitoring, ED     Status: Abnormal   Collection Time: 03/15/21  5:23 PM  Result Value Ref Range   Glucose-Capillary 181 (H)  70 - 99 mg/dL    Comment: Glucose reference range applies only to samples taken after fasting for at least 8 hours.  Glucose, capillary     Status: Abnormal   Collection Time: 03/15/21  6:25 PM  Result Value Ref Range   Glucose-Capillary 173 (H) 70 - 99 mg/dL    Comment: Glucose reference range applies only to samples taken after fasting for at least 8 hours.  MRSA Next Gen by PCR, Nasal     Status: None   Collection Time: 03/15/21  6:30 PM   Specimen: Nasal Mucosa; Nasal Swab  Result Value Ref Range   MRSA by PCR Next Gen NOT DETECTED NOT DETECTED    Comment: (NOTE) The GeneXpert MRSA Assay (FDA approved for NASAL specimens only), is one component of a comprehensive MRSA colonization surveillance program. It is not intended to diagnose MRSA infection nor to guide or monitor treatment for MRSA infections. Test performance is not FDA approved in patients less than 58 years old. Performed at [facility], [address] [place], [place] [address]   HIV Antibody (routine testing w rflx)     Status: None   Collection Time: 03/15/21  7:23 PM  Result Value Ref Range   HIV Screen 4th Generation wRfx Non Reactive Non Reactive    Comment: Performed at [facility], [address], [place], [place] [address]  Hemoglobin A1c     Status: Abnormal   Collection Time: 03/15/21  7:23 PM  Result Value Ref Range   Hgb A1c MFr Bld 7.7 (H) 4.8 - 5.6 %    Comment: (NOTE) Pre diabetes:          5.7%-6.4%  Diabetes:              >6.4%  Glycemic control for   <7.0% adults with diabetes    Mean Plasma Glucose 174.29 mg/dL    Comment: Performed at [facility], [address], [place], [place] [address]  Magnesium     Status: None   Collection Time: 03/15/21  7:23 PM  Result Value Ref Range   Magnesium 1.9 1.7 - 2.4 mg/dL    Comment: Performed at [facility], [address], [place], [place] [address]  Phosphorus     Status: Abnormal   Collection Time: 03/15/21  7:23 PM  Result Value Ref Range   Phosphorus 1.5 (L) 2.5 - 4.6 mg/dL    Comment: Performed at [facility], [address] [place], [place] [address]  Glucose, capillary     Status: Abnormal   Collection Time: 03/15/21  7:26 PM  Result Value Ref Range   Glucose-Capillary 181 (H) 70 - 99 mg/dL    Comment: Glucose reference range applies only to samples taken after fasting for at least 8 hours.  Glucose, capillary     Status: Abnormal   Collection Time: 03/15/21  8:33 PM  Result Value Ref Range   Glucose-Capillary 232 (H) 70 - 99 mg/dL    Comment: Glucose reference range applies only to samples taken after fasting for at least 8 hours.  Basic metabolic panel     Status: Abnormal   Collection Time: 03/15/21  9:10 PM  Result Value Ref Range   Sodium 132 (L) 135 - 145 mmol/L   Potassium 4.1 3.5 - 5.1 mmol/L   Chloride 109 98 - 111 mmol/L   CO2 12 (L) 22 - 32 mmol/L   Glucose,  Bld 206 (H)  70 - 99 mg/dL    Comment: Glucose reference range applies only to samples taken after fasting for at least 8 hours.   BUN 22 (H) 6 - 20 mg/dL   Creatinine, Ser 9.601.05 0.61 - 1.24 mg/dL   Calcium 8.9 8.9 - 45.410.3 mg/dL   GFR, Estimated >09>60 >81>60 mL/min    Comment: (NOTE) Calculated using the CKD-EPI Creatinine Equation (2021)    Anion gap 11 5 - 15    Comment: Performed at [facility], [address] [place], [place] [address]  Beta-hydroxybutyric acid     Status: Abnormal   Collection Time: 03/15/21  9:10 PM  Result Value Ref Range   Beta-Hydroxybutyric Acid 3.74 (H) 0.05 - 0.27 mmol/L    Comment: Performed at [facility], [address] [place], [place] [address]  Glucose, capillary     Status: Abnormal   Collection Time: 03/15/21  9:28 PM  Result Value Ref Range   Glucose-Capillary 176 (H) 70 - 99 mg/dL    Comment: Glucose reference range applies only to samples taken after fasting for at least 8 hours.   Comment 1 Notify RN    Comment 2 Document in Chart   Glucose, capillary     Status: Abnormal   Collection Time: 03/15/21 10:37 PM  Result Value Ref Range   Glucose-Capillary 183 (H) 70 - 99 mg/dL    Comment: Glucose reference range applies only to samples taken after fasting for at least 8 hours.  Glucose, capillary     Status: Abnormal   Collection Time: 03/15/21 11:47 PM  Result Value Ref Range   Glucose-Capillary 177 (H) 70 - 99 mg/dL    Comment: Glucose reference range applies only to samples taken after fasting for at least 8 hours.  Basic metabolic panel     Status: Abnormal   Collection Time: 03/16/21 12:41 AM  Result Value Ref Range   Sodium 133 (L) 135 - 145 mmol/L   Potassium 3.8 3.5 - 5.1 mmol/L   Chloride 109 98 - 111 mmol/L   CO2 17 (L) 22 - 32 mmol/L   Glucose, Bld 174 (H) 70 - 99 mg/dL    Comment: Glucose reference range applies only to samples taken after fasting for at least 8 hours.   BUN 21 (H) 6 - 20  mg/dL   Creatinine, Ser 2.131.00 0.61 - 1.24 mg/dL   Calcium 9.1 8.9 - 08.610.3 mg/dL   GFR, Estimated >57>60 >84>60 mL/min    Comment: (NOTE) Calculated using the CKD-EPI Creatinine Equation (2021)    Anion gap 7 5 - 15    Comment: Performed at [facility], [address] [place], [place] [address]  Glucose, capillary     Status: Abnormal   Collection Time: 03/16/21 12:58 AM  Result Value Ref Range   Glucose-Capillary 170 (H) 70 - 99 mg/dL    Comment: Glucose reference range applies only to samples taken after fasting for at least 8 hours.  Glucose, capillary     Status: Abnormal   Collection Time: 03/16/21  2:05 AM  Result Value Ref Range   Glucose-Capillary 162 (H) 70 - 99 mg/dL    Comment: Glucose reference range applies only to samples taken after fasting for at least 8 hours.  Glucose, capillary     Status: Abnormal   Collection Time: 03/16/21  4:00 AM  Result Value Ref Range   Glucose-Capillary 151 (H) 70 - 99 mg/dL    Comment: Glucose reference range  applies only to samples taken after fasting for at least 8 hours.  Basic metabolic panel     Status: Abnormal   Collection Time: 03/16/21  5:17 AM  Result Value Ref Range   Sodium 132 (L) 135 - 145 mmol/L   Potassium 3.4 (L) 3.5 - 5.1 mmol/L   Chloride 104 98 - 111 mmol/L   CO2 20 (L) 22 - 32 mmol/L   Glucose, Bld 141 (H) 70 - 99 mg/dL    Comment: Glucose reference range applies only to samples taken after fasting for at least 8 hours.   BUN 18 6 - 20 mg/dL   Creatinine, Ser 0.73 0.61 - 1.24 mg/dL   Calcium 8.8 (L) 8.9 - 10.3 mg/dL   GFR, Estimated >71 >06 mL/min    Comment: (NOTE) Calculated using the CKD-EPI Creatinine Equation (2021)    Anion gap 8 5 - 15    Comment: Performed at [facility], [address], [place], [place] [address]  Beta-hydroxybutyric acid     Status: Abnormal   Collection Time: 03/16/21  5:17 AM  Result Value Ref Range   Beta-Hydroxybutyric Acid 0.61 (H) 0.05 - 0.27  mmol/L    Comment: Performed at [facility], [address], [place], [place] [address]  Phosphorus     Status: None   Collection Time: 03/16/21  5:17 AM  Result Value Ref Range   Phosphorus 3.4 2.5 - 4.6 mg/dL    Comment: Performed at [facility], [address] [place], [place] [address]  CBC with Differential/Platelet     Status: Abnormal   Collection Time: 03/16/21  5:17 AM  Result Value Ref Range   WBC 21.3 (H) 4.0 - 10.5 K/uL   RBC 4.97 4.22 - 5.81 MIL/uL   Hemoglobin 14.7 13.0 - 17.0 g/dL   HCT 93.8 18.2 - 99.3 %   MCV 83.7 80.0 - 100.0 fL   MCH 29.6 26.0 - 34.0 pg   MCHC 35.3 30.0 - 36.0 g/dL   RDW 71.6 96.7 - 89.3 %   Platelets 352 150 - 400 K/uL   nRBC 0.0 0.0 - 0.2 %   Neutrophils Relative % 83 %   Neutro Abs 17.8 (H) 1.7 - 7.7 K/uL   Lymphocytes Relative 7 %   Lymphs Abs 1.6 0.7 - 4.0 K/uL   Monocytes Relative 9 %   Monocytes Absolute 1.8 (H) 0.1 - 1.0 K/uL   Eosinophils Relative 0 %   Eosinophils Absolute 0.0 0.0 - 0.5 K/uL   Basophils Relative 0 %   Basophils Absolute 0.0 0.0 - 0.1 K/uL   Immature Granulocytes 1 %   Abs Immature Granulocytes 0.12 (H) 0.00 - 0.07 K/uL    Comment: Performed at [facility], [address] [place], [place] [address]  Glucose, capillary     Status: Abnormal   Collection Time: 03/16/21  6:13 AM  Result Value Ref Range   Glucose-Capillary 147 (H) 70 - 99 mg/dL    Comment: Glucose reference range applies only to samples taken after fasting for at least 8 hours.  Glucose, capillary     Status: Abnormal   Collection Time: 03/16/21  8:10 AM  Result Value Ref Range   Glucose-Capillary 137 (H) 70 - 99 mg/dL    Comment: Glucose reference range applies only to samples taken after fasting for at least 8 hours.   Comment 1 Notify RN    Comment 2 Document in Chart    Comment  3 Glucose Stabilizer   Glucose, capillary     Status: Abnormal   Collection Time: 03/16/21  9:16 AM   Result Value Ref Range   Glucose-Capillary 137 (H) 70 - 99 mg/dL    Comment: Glucose reference range applies only to samples taken after fasting for at least 8 hours.   Comment 1 Notify RN    Comment 2 Document in Chart    Comment 3 Glucose Stabilizer   Glucose, capillary     Status: Abnormal   Collection Time: 03/16/21 10:21 AM  Result Value Ref Range   Glucose-Capillary 150 (H) 70 - 99 mg/dL    Comment: Glucose reference range applies only to samples taken after fasting for at least 8 hours.   Comment 1 Notify RN    Comment 2 Document in Chart    Comment 3 Glucose Stabilizer   Glucose, capillary     Status: None   Collection Time: 03/16/21  1:00 PM  Result Value Ref Range   Glucose-Capillary 92 70 - 99 mg/dL    Comment: Glucose reference range applies only to samples taken after fasting for at least 8 hours.   Comment 1 Notify RN    Comment 2 Document in Chart   Glucose, capillary     Status: Abnormal   Collection Time: 03/16/21  5:05 PM  Result Value Ref Range   Glucose-Capillary 167 (H) 70 - 99 mg/dL    Comment: Glucose reference range applies only to samples taken after fasting for at least 8 hours.   Comment 1 Notify RN    Comment 2 Document in Chart   Glucose, capillary     Status: None   Collection Time: 03/16/21  8:07 PM  Result Value Ref Range   Glucose-Capillary 81 70 - 99 mg/dL    Comment: Glucose reference range applies only to samples taken after fasting for at least 8 hours.   Comment 1 Notify RN    Comment 2 Document in Chart   Glucose, capillary     Status: Abnormal   Collection Time: 03/16/21 11:35 PM  Result Value Ref Range   Glucose-Capillary 105 (H) 70 - 99 mg/dL    Comment: Glucose reference range applies only to samples taken after fasting for at least 8 hours.   Comment 1 Notify RN    Comment 2 Document in Chart   Basic metabolic panel     Status: Abnormal   Collection Time: 03/17/21  2:42 AM  Result Value Ref Range   Sodium 132 (L) 135 - 145  mmol/L   Potassium 2.9 (L) 3.5 - 5.1 mmol/L   Chloride 101 98 - 111 mmol/L   CO2 24 22 - 32 mmol/L   Glucose, Bld 104 (H) 70 - 99 mg/dL    Comment: Glucose reference range applies only to samples taken after fasting for at least 8 hours.   BUN 9 6 - 20 mg/dL   Creatinine, Ser 6.96 0.61 - 1.24 mg/dL   Calcium 8.3 (L) 8.9 - 10.3 mg/dL   GFR, Estimated >29 >52 mL/min    Comment: (NOTE) Calculated using the CKD-EPI Creatinine Equation (2021)    Anion gap 7 5 - 15    Comment: Performed at [facility], [address] [place], [place] [address]  CBC with Differential/Platelet     Status: Abnormal   Collection Time: 03/17/21  2:42 AM  Result Value Ref Range   WBC 10.8 (H) 4.0 - 10.5 K/uL   RBC 4.62 4.22 -  5.81 MIL/uL   Hemoglobin 13.7 13.0 - 17.0 g/dL   HCT 16.1 09.6 - 04.5 %   MCV 84.6 80.0 - 100.0 fL   MCH 29.7 26.0 - 34.0 pg   MCHC 35.0 30.0 - 36.0 g/dL   RDW 40.9 81.1 - 91.4 %   Platelets 315 150 - 400 K/uL   nRBC 0.0 0.0 - 0.2 %   Neutrophils Relative % 71 %   Neutro Abs 7.6 1.7 - 7.7 K/uL   Lymphocytes Relative 20 %   Lymphs Abs 2.2 0.7 - 4.0 K/uL   Monocytes Relative 9 %   Monocytes Absolute 1.0 0.1 - 1.0 K/uL   Eosinophils Relative 0 %   Eosinophils Absolute 0.0 0.0 - 0.5 K/uL   Basophils Relative 0 %   Basophils Absolute 0.0 0.0 - 0.1 K/uL   Immature Granulocytes 0 %   Abs Immature Granulocytes 0.03 0.00 - 0.07 K/uL    Comment: Performed at [facility], [address], [place], [place] [address]  Magnesium     Status: None   Collection Time: 03/17/21  3:26 AM  Result Value Ref Range   Magnesium 1.9 1.7 - 2.4 mg/dL    Comment: Performed at [facility], [address] [place], [place] [address]  Glucose, capillary     Status: Abnormal   Collection Time: 03/17/21  3:45 AM  Result Value Ref Range   Glucose-Capillary 118 (H) 70 - 99 mg/dL    Comment: Glucose reference range applies only to samples taken after  fasting for at least 8 hours.   Comment 1 Notify RN    Comment 2 Document in Chart   Glucose, capillary     Status: Abnormal   Collection Time: 03/17/21  7:52 AM  Result Value Ref Range   Glucose-Capillary 151 (H) 70 - 99 mg/dL    Comment: Glucose reference range applies only to samples taken after fasting for at least 8 hours.   Comment 1 Notify RN    Comment 2 Document in Chart   Glucose, capillary     Status: Abnormal   Collection Time: 03/17/21 11:21 AM  Result Value Ref Range   Glucose-Capillary 129 (H) 70 - 99 mg/dL    Comment: Glucose reference range applies only to samples taken after fasting for at least 8 hours.   Comment 1 Notify RN    Comment 2 Document in Chart     DG Chest 2 View  Result Date: 03/15/2021 CLINICAL DATA:  DKA EXAM: CHEST - 2 VIEW COMPARISON:  November 2020 FINDINGS: The heart size and mediastinal contours are within normal limits. Both lungs are clear. No pleural effusion or pneumothorax. The visualized skeletal structures are unremarkable. IMPRESSION: No acute process in the chest. Electronically Signed   By: Guadlupe Spanish M.D.   On: 03/15/2021 18:09    Review of Systems negative except above Blood pressure 134/87, pulse 65, temperature 98.2 F (36.8 C), temperature source Oral, resp. rate 16, height 5\' 9"  (1.753 m), weight 68.7 kg, SpO2 100 %. Physical Exam vital signs stable afebrile no acute distress abdomen is soft nontender BUN and creatinine okay White count improved hemoglobin normal  Assessment/Plan: Self-limited hematemesis and painful swallowing Plan: We discussed repeat endoscopy and if doing better we will hold off tomorrow otherwise n.p.o. after midnight and if no better we will proceed with endoscopy with further work-up and plans pending those findings  Logan Washington E 03/17/2021, 2:17 PM

## 2021-03-18 ENCOUNTER — Inpatient Hospital Stay (HOSPITAL_COMMUNITY): Payer: Medicaid Other | Admitting: Certified Registered Nurse Anesthetist

## 2021-03-18 ENCOUNTER — Encounter (HOSPITAL_COMMUNITY)
Admission: EM | Disposition: A | Discharge status: Home / Self Care | Payer: Self-pay | Source: Home / Self Care | Attending: Internal Medicine

## 2021-03-18 ENCOUNTER — Encounter (HOSPITAL_COMMUNITY): Payer: Self-pay | Admitting: Internal Medicine

## 2021-03-18 HISTORY — PX: ESOPHAGOGASTRODUODENOSCOPY (EGD) WITH PROPOFOL: SHX5813

## 2021-03-18 LAB — GLUCOSE, CAPILLARY
Glucose-Capillary: 177 mg/dL — ABNORMAL HIGH (ref 70–99)
Glucose-Capillary: 212 mg/dL — ABNORMAL HIGH (ref 70–99)
Glucose-Capillary: 123 mg/dL — ABNORMAL HIGH (ref 70–99)
Glucose-Capillary: 95 mg/dL (ref 70–99)
Glucose-Capillary: 216 mg/dL — ABNORMAL HIGH (ref 70–99)
Glucose-Capillary: 317 mg/dL — ABNORMAL HIGH (ref 70–99)

## 2021-03-18 LAB — BASIC METABOLIC PANEL
Anion gap: 9 (ref 5–15)
BUN: 7 mg/dL (ref 6–20)
CO2: 27 mmol/L (ref 22–32)
Calcium: 8.9 mg/dL (ref 8.9–10.3)
Chloride: 97 mmol/L — ABNORMAL LOW (ref 98–111)
Creatinine, Ser: 0.83 mg/dL (ref 0.61–1.24)
GFR, Estimated: >60 mL/min (ref >60)
Glucose, Bld: 182 mg/dL — ABNORMAL HIGH (ref 70–99)
Potassium: 2.8 mmol/L — ABNORMAL LOW (ref 3.5–5.1)
Sodium: 133 mmol/L — ABNORMAL LOW (ref 135–145)

## 2021-03-18 LAB — CBC
HCT: 42.8 % (ref 39.0–52.0)
Hemoglobin: 15.1 g/dL (ref 13.0–17.0)
MCH: 29.3 pg (ref 26.0–34.0)
MCHC: 35.3 g/dL (ref 30.0–36.0)
MCV: 82.9 fL (ref 80.0–100.0)
Platelets: 301 10*3/uL (ref 150–400)
RBC: 5.16 MIL/uL (ref 4.22–5.81)
RDW: 12.3 % (ref 11.5–15.5)
WBC: 9.8 10*3/uL (ref 4.0–10.5)
nRBC: 0 % (ref 0.0–0.2)

## 2021-03-18 LAB — POTASSIUM: Potassium: 3.4 mmol/L — ABNORMAL LOW (ref 3.5–5.1)

## 2021-03-18 LAB — MAGNESIUM: Magnesium: 1.8 mg/dL (ref 1.7–2.4)

## 2021-03-18 SURGERY — ESOPHAGOGASTRODUODENOSCOPY (EGD) WITH PROPOFOL
Anesthesia: Monitor Anesthesia Care

## 2021-03-18 MED ORDER — SODIUM CHLORIDE 0.9 % IV SOLN: INTRAVENOUS | Status: DC

## 2021-03-18 MED ORDER — PROPOFOL 10 MG/ML IV BOLUS
INTRAVENOUS | Status: AC
  Filled 2021-03-18: qty 20

## 2021-03-18 MED ORDER — LIDOCAINE 2% (20 MG/ML) 5 ML SYRINGE
INTRAMUSCULAR | Status: DC | PRN
  Administered 2021-03-18: 80 mg via INTRAVENOUS

## 2021-03-18 MED ORDER — PROPOFOL 10 MG/ML IV BOLUS
INTRAVENOUS | Status: DC | PRN
  Administered 2021-03-18: 30 mg via INTRAVENOUS
  Administered 2021-03-18: 40 mg via INTRAVENOUS
  Administered 2021-03-18: 30 mg via INTRAVENOUS

## 2021-03-18 MED ORDER — PROPOFOL 500 MG/50ML IV EMUL
INTRAVENOUS | Status: DC | PRN
  Administered 2021-03-18: 150 ug/kg/min via INTRAVENOUS

## 2021-03-18 MED ORDER — PANTOPRAZOLE SODIUM 40 MG PO TBEC
40.0000 mg | DELAYED_RELEASE_TABLET | Freq: Every day | ORAL | Status: DC
  Administered 2021-03-18 – 2021-03-19 (×2): 40 mg via ORAL
  Filled 2021-03-18 (×2): qty 1

## 2021-03-18 MED ORDER — POTASSIUM CHLORIDE CRYS ER 10 MEQ PO TBCR
40.0000 meq | EXTENDED_RELEASE_TABLET | ORAL | Status: AC
  Administered 2021-03-18 (×2): 40 meq via ORAL
  Filled 2021-03-18 (×2): qty 4

## 2021-03-18 MED ORDER — PROPOFOL 1000 MG/100ML IV EMUL
INTRAVENOUS | Status: AC
  Filled 2021-03-18: qty 100

## 2021-03-18 MED ORDER — MAGNESIUM SULFATE 2 GM/50ML IV SOLN
2.0000 g | Freq: Once | INTRAVENOUS | Status: AC
  Administered 2021-03-18: 2 g via INTRAVENOUS
  Filled 2021-03-18: qty 50

## 2021-03-18 MED ORDER — INSULIN GLARGINE-YFGN 100 UNIT/ML ~~LOC~~ SOLN
25.0000 [IU] | SUBCUTANEOUS | Status: DC
  Administered 2021-03-19: 25 [IU] via SUBCUTANEOUS
  Filled 2021-03-18: qty 0.25

## 2021-03-18 MED ORDER — POTASSIUM CHLORIDE CRYS ER 10 MEQ PO TBCR
40.0000 meq | EXTENDED_RELEASE_TABLET | Freq: Once | ORAL | Status: AC
  Administered 2021-03-18: 40 meq via ORAL
  Filled 2021-03-18: qty 4

## 2021-03-18 MED ORDER — LACTATED RINGERS IV SOLN: INTRAVENOUS | Status: DC

## 2021-03-18 SURGICAL SUPPLY — 15 items

## 2021-03-18 NOTE — Transfer of Care (Signed)
Immediate Anesthesia Transfer of Care Note  Patient: Logan Washington  Procedure(s) Performed: ESOPHAGOGASTRODUODENOSCOPY (EGD) WITH PROPOFOL  Patient Location: PACU  Anesthesia Type:MAC  Level of Consciousness: drowsy and patient cooperative  Airway & Oxygen Therapy: Patient Spontanous Breathing and Patient connected to face mask oxygen  Post-op Assessment: Report given to RN and Post -op Vital signs reviewed and stable  Post vital signs: Reviewed and stable  Last Vitals:  Vitals Value Taken Time  BP 109/63 03/18/21 1002  Temp 36.8 C 03/18/21 1002  Pulse 96 03/18/21 1007  Resp 18 03/18/21 1007  SpO2 100 % 03/18/21 1007  Vitals shown include unvalidated device data.  Last Pain:  Vitals:   03/18/21 1002  TempSrc:   PainSc: Asleep         Complications: No notable events documented.

## 2021-03-18 NOTE — Progress Notes (Signed)
PROGRESS NOTE    Twyman Prusak  [id number] DOB: [birth year] DOA: 03/15/2021 PCP: Patient, No Pcp Per (Inactive)    Chief Complaint  Patient presents with   Nausea   Emesis    Brief Narrative:  Patient 21 year old gentleman history of type 1 diabetes, recently incarcerated for 5 days for assault charges stated did not receive any insulin while in jail presented to the ED with a 4-day history of nausea emesis, epigastric abdominal pain with some bouts of hematemesis.  Patient noted to be in DKA.  Patient placed on the Endo tool, IV fluids, bowel rest.  DKA resolved patient transition to subcutaneous insulin.  Patient with complaints of odynophagia and GI consulted and patient underwent upper endoscopy 03/18/2021.Marland Kitchen   Assessment & Plan:   Principal Problem:   DKA (diabetic ketoacidosis) ([place]) Active Problems:   DKA, type 1 ([place])   AKI (acute kidney injury) ([place])   Dehydration   Hematemesis   Leukocytosis   Hyperkalemia   Hypokalemia   Odynophagia  #1 DKA/type I diabetic -Patient presenting in DKA secondary to medication noncompliance as he was incarcerated in jail and stated did not receive any of his insulin. -Patient on admission noted to have a blood glucose level of [id number] on basic metabolic profile, noted to be acidotic with a bicarb of 9, potassium of 5.4, anion gap of 19.  Beta hydroxybutyrate was elevated at > 8.  -Urinalysis with glycosuria and ketonuria. -Urinalysis nitrite negative, leukocytes negative.  Chest x-ray pending. -Hemoglobin A1c at 7.7 -Patient was started on the Endo tool/insulin drip in the ED which was continued.  -Anion gap is closed.   -Acidosis with significant improvement with bicarb currently at 24.   -CBG 212 this morning.   -Beta hydroxybutyrate trended down.   -Patient has been transition from insulin drip back to home regimen of Semglee 31 units daily.   -Patient made n.p.o. after midnight in anticipation of upper endoscopy and as such would  decrease Semglee to 12 units daily today and increase back to home regimen tomorrow.   -SSI.   -Diabetes coordinator following.    2.  Hematemesis/hemoptysis -Patient with complaints of hematemesis prior to admission however no hematemesis noted in the ED. -Patient complain of some abdominal pain, esophageal pain likely secondary to nausea and emesis. -Patient noted to have EGD done 12/28/2019 which showed LA grade a reflux esophagitis with gastritis. -Patient noted not to be compliant with PPI after upper endoscopy stating symptoms had improved and had resolved. -Hematemesis could be secondary to Mallory-Weiss tear. -Patient with no further hematemesis. -Continue PPI daily.   -Patient with complaints of odynophagia, GI assess patient in consultation, patient underwent upper endoscopy with gastritis noted.  -Continue Carafate.  -Supportive care.   -Follow H&H.   3.  Dehydration -Noted on lab work with elevated hemoglobin levels, thrombocytosis, leukocytosis -Improved with hydration. -Saline lock IV fluids.  4.  Leukocytosis Likely a reactive leukocytosis secondary to problem #1 and 2 and #3. -Patient afebrile. -Urinalysis nitrite negative leukocytes negative. -Chest x-ray with no acute infiltrate.. -Leukocytosis trended down.   -Continue to hold off on antibiotics.  -Supportive care.  5.  Odynophagia -Patient with complaints of upper throat pain, difficulty swallowing food yesterday and as such diet downgraded to clear liquids which he is tolerating. -Patient initially stating throat may be closing. -Likely secondary to emesis, hematemesis prior to admission. -No exudate noted on oral examination. -Patient states some improvement with Carafate. -Patient seen in consultation by GI, underwent upper endoscopy  03/18/2021 which showed posterior oropharynx erythematous with few shallow ulcers, gastritis, tiny hiatal hernia. -Diet advanced to a soft diet per GI who recommended daily  PPI. -Outpatient follow-up with ENT if symptoms persist. -Continue Carafate 4 times daily, Magic mouthwash with lidocaine.  6.  History of esophagitis/gastritis -Patient with some complaints of odynophagia with food.  -Status post upper endoscopy 03/18/2021 which showed a posterior oropharynx erythematous with few shallow ulcers, gastritis, tiny hiatial hernia.  -Continue PPI.    7.  Hyperkalemia>>> hypokalemia -Secondary to problem #1. -Hyperkalemia improved, patient with potassium of 2.8 this morning. -K-Dur 40 mEq p.o. every 4 hours x2 doses. -Repeat potassium this afternoon. -Follow.  8.  Acute kidney injury -Likely secondary to prerenal azotemia from GI losses. -Improved with hydration. -Saline lock IV fluids.   DVT prophylaxis: SCDs Code Status: Full Family Communication: Updated patient.  No family at bedside Disposition:   Status is: Inpatient Remains inpatient appropriate because: Severity of illness           Consultants:  Diabetes coordinator GI: Dr Watt Climes 03/17/2021  Procedures:  Chest x-ray 03/15/2021 Upper endoscopy 03/18/2021 per Dr. Watt Climes  Antimicrobials:  None   Subjective: Patient just returned from endoscopy.  Denies any chest pain.  No shortness of breath.  Feels Carafate has been helping with throat pain.  Overall feeling better than he did on admission.  Uncle at bedside.   Objective: Vitals:   03/18/21 1015 03/18/21 1030 03/18/21 1043 03/18/21 1144  BP: 124/80 125/83 (!) 141/96 (!) 156/97  Pulse: 91 79 79 86  Resp: 19 17 20 19   Temp:   98.5 F (36.9 C) 98.8 F (37.1 C)  TempSrc:   Oral Oral  SpO2: 99% 98% 100% 100%  Weight:      Height:        Intake/Output Summary (Last 24 hours) at 03/18/2021 1220 Last data filed at 03/18/2021 0953 Gross per 24 hour  Intake 545.89 ml  Output 900 ml  Net -354.11 ml    Filed Weights   03/16/21 1720 03/18/21 0857  Weight: 68.7 kg 68.7 kg    Examination:  General exam: NAD. Oropharynx:  Oropharynx is clear, no exudate, slightly erythematous in the posterior pharynx.   Respiratory system: Lungs clear to auscultation bilaterally.  No wheezes, no crackles, no rhonchi.  Normal respiratory effort.  Cardiovascular system: RRR no murmurs rubs or gallops.  No JVD.  No lower extremity edema.  Gastrointestinal system: Abdomen is soft, nontender, nondistended, positive bowel sounds.  No rebound.  No guarding.  Central nervous system: Alert and oriented. No focal neurological deficits. Extremities: Symmetric 5 x 5 power.  Home monitoring device on left ankle. Skin: No rashes, lesions or ulcers Psychiatry: Judgement and insight appear normal. Mood & affect appropriate.     Data Reviewed: I have personally reviewed following labs and imaging studies  CBC: Recent Labs  Lab 03/15/21 1334 03/16/21 0517 03/17/21 0242 03/18/21 0247  WBC 28.7* 21.3* 10.8* 9.8  NEUTROABS 24.5* 17.8* 7.6  --   HGB 17.6* 14.7 13.7 15.1  HCT 52.7* 41.6 39.1 42.8  MCV 87.1 83.7 84.6 82.9  PLT 446* 352 315 301     Basic Metabolic Panel: Recent Labs  Lab 03/15/21 1923 03/15/21 2110 03/16/21 0041 03/16/21 0517 03/17/21 0242 03/17/21 0326 03/18/21 0247  NA  --  132* 133* 132* 132*  --  133*  K  --  4.1 3.8 3.4* 2.9*  --  2.8*  CL  --  109 109 104  101  --  97*  CO2  --  12* 17* 20* 24  --  27  GLUCOSE  --  206* 174* 141* 104*  --  182*  BUN  --  22* 21* 18 9  --  7  CREATININE  --  1.05 1.00 0.89 0.90  --  0.83  CALCIUM  --  8.9 9.1 8.8* 8.3*  --  8.9  MG 1.9  --   --   --   --  1.9 1.8  PHOS 1.5*  --   --  3.4  --   --   --      GFR: Estimated Creatinine Clearance: 135.7 mL/min (by C-G formula based on SCr of 0.83 mg/dL).  Liver Function Tests: No results for input(s): AST, ALT, ALKPHOS, BILITOT, PROT, ALBUMIN in the last 168 hours.  CBG: Recent Labs  Lab 03/17/21 2340 03/18/21 0324 03/18/21 0727 03/18/21 1005 03/18/21 1118  GLUCAP 212* 177* 212* 123* 95      Recent Results  (from the past 240 hour(s))  Resp Panel by RT-PCR (Flu A&B, Covid) Nasopharyngeal Swab     Status: None   Collection Time: 03/15/21  1:34 PM   Specimen: Nasopharyngeal Swab; Nasopharyngeal(NP) swabs in vial transport medium  Result Value Ref Range Status   SARS Coronavirus 2 by RT PCR NEGATIVE NEGATIVE Final    Comment: (NOTE) SARS-CoV-2 target nucleic acids are NOT DETECTED.  The SARS-CoV-2 RNA is generally detectable in upper respiratory specimens during the acute phase of infection. The lowest concentration of SARS-CoV-2 viral copies this assay can detect is 138 copies/mL. A negative result does not preclude SARS-Cov-2 infection and should not be used as the sole basis for treatment or other patient management decisions. A negative result may occur with  improper specimen collection/handling, submission of specimen other than nasopharyngeal swab, presence of viral mutation(s) within the areas targeted by this assay, and inadequate number of viral copies(<138 copies/mL). A negative result must be combined with clinical observations, patient history, and epidemiological information. The expected result is Negative.  Fact Sheet for Patients:  EntrepreneurPulse.com.au  Fact Sheet for Healthcare Providers:  IncredibleEmployment.be  This test is no t yet approved or cleared by the [place] FDA and  has been authorized for detection and/or diagnosis of SARS-CoV-2 by FDA under an Emergency Use Authorization (EUA). This EUA will remain  in effect (meaning this test can be used) for the duration of the COVID-19 declaration under Section 564(b)(1) of the Act, 21 U.S.C.section 360bbb-3(b)(1), unless the authorization is terminated  or revoked sooner.       Influenza A by PCR NEGATIVE NEGATIVE Final   Influenza B by PCR NEGATIVE NEGATIVE Final    Comment: (NOTE) The Xpert Xpress SARS-CoV-2/FLU/RSV plus assay is intended as an aid in the  diagnosis of influenza from Nasopharyngeal swab specimens and should not be used as a sole basis for treatment. Nasal washings and aspirates are unacceptable for Xpert Xpress SARS-CoV-2/FLU/RSV testing.  Fact Sheet for Patients: EntrepreneurPulse.com.au  Fact Sheet for Healthcare Providers: IncredibleEmployment.be  This test is not yet approved or cleared by the [place] FDA and has been authorized for detection and/or diagnosis of SARS-CoV-2 by FDA under an Emergency Use Authorization (EUA). This EUA will remain in effect (meaning this test can be used) for the duration of the COVID-19 declaration under Section 564(b)(1) of the Act, 21 U.S.C. section 360bbb-3(b)(1), unless the authorization is terminated or revoked.  Performed at [facility], [place]  [address], [place], [place] [address]   MRSA Next Gen by PCR, Nasal     Status: None   Collection Time: 03/15/21  6:30 PM   Specimen: Nasal Mucosa; Nasal Swab  Result Value Ref Range Status   MRSA by PCR Next Gen NOT DETECTED NOT DETECTED Final    Comment: (NOTE) The GeneXpert MRSA Assay (FDA approved for NASAL specimens only), is one component of a comprehensive MRSA colonization surveillance program. It is not intended to diagnose MRSA infection nor to guide or monitor treatment for MRSA infections. Test performance is not FDA approved in patients less than 48 years old. Performed at [facility], [place] [address], [place], [place] [address]           Radiology Studies: No results found.      Scheduled Meds:  (feeding supplement) PROSource Plus  30 mL Oral BID BM   Chlorhexidine Gluconate Cloth  6 each Topical Daily   dicyclomine  20 mg Oral TID AC & HS   feeding supplement  1 Container Oral TID BM   insulin aspart  0-15 Units Subcutaneous TID WC   insulin glargine-yfgn  12 Units Subcutaneous Q24H   multivitamin with minerals  1 tablet  Oral Daily   pantoprazole  40 mg Oral Daily   sucralfate  1 g Oral Q6H   Continuous Infusions:     LOS: 3 days    Time spent: 40 minutes    Irine Seal, MD [facility]   To contact the attending provider between 7A-7P or the covering provider during after hours 7P-7A, please log into the web site [email or web address] and access using universal [place] password for that web site. If you do not have the password, please call the hospital operator.  03/18/2021, 12:20 PM

## 2021-03-18 NOTE — Anesthesia Preprocedure Evaluation (Signed)
Anesthesia Evaluation  Patient identified by MRN, date of birth, ID band Patient awake    Reviewed: Allergy & Precautions, NPO status , Patient's Chart, lab work & pertinent test results  History of Anesthesia Complications Negative for: history of anesthetic complications  Airway Mallampati: I  TM Distance: >3 FB Neck ROM: Full    Dental  (+) Teeth Intact, Dental Advisory Given   Pulmonary neg pulmonary ROS, neg shortness of breath, neg sleep apnea, neg COPD, neg recent URI,    breath sounds clear to auscultation       Cardiovascular negative cardio ROS   Rhythm:Regular     Neuro/Psych negative neurological ROS  negative psych ROS   GI/Hepatic negative GI ROS, Neg liver ROS, Painful swallowing, denies nausea and vomiting   Endo/Other  diabetes, Insulin DependentLab Results      Component                Value               Date                      HGBA1C                   7.7 (H)             03/15/2021             Renal/GU negative Renal ROS     Musculoskeletal negative musculoskeletal ROS (+)   Abdominal   Peds  Hematology negative hematology ROS (+) Lab Results      Component                Value               Date                      WBC                      9.8                 03/18/2021                HGB                      15.1                03/18/2021                HCT                      42.8                03/18/2021                MCV                      82.9                03/18/2021                PLT                      301                 03/18/2021              Anesthesia  Other Findings k 2.8, 37m po given on floor  Reproductive/Obstetrics                             Anesthesia Physical Anesthesia Plan  ASA: 2  Anesthesia Plan: MAC   Post-op Pain Management: Minimal or no pain anticipated   Induction:   PONV Risk Score and Plan: 1 and Propofol  infusion and Treatment may vary due to age or medical condition  Airway Management Planned: Nasal Cannula  Additional Equipment:   Intra-op Plan:   Post-operative Plan:   Informed Consent: I have reviewed the patients History and Physical, chart, labs and discussed the procedure including the risks, benefits and alternatives for the proposed anesthesia with the patient or authorized representative who has indicated his/her understanding and acceptance.     Dental advisory given  Plan Discussed with: CRNA and Anesthesiologist  Anesthesia Plan Comments:         Anesthesia Quick Evaluation

## 2021-03-18 NOTE — Progress Notes (Signed)
Logan Washington 8:14 AM  Subjective: Patient actually doing a little better and thinks the viscous lidocaine and Carafate helps and no new complaints and I offered to hold off versus go ahead and look and we elected to go ahead this morning just to be ahead of the game  Objective: Vital signs stable afebrile exam please see preassessment evaluation labs okay potassium little low  Assessment:  painful swallowing  Plan: Okay to proceed with endoscopy today with anesthesia assistance  [facility]  office [phone] After 5PM or if no answer call [phone]

## 2021-03-18 NOTE — Anesthesia Postprocedure Evaluation (Signed)
Anesthesia Post Note  Patient: Logan Washington  Procedure(s) Performed: ESOPHAGOGASTRODUODENOSCOPY (EGD) WITH PROPOFOL     Patient location during evaluation: PACU Anesthesia Type: MAC Level of consciousness: awake and alert Pain management: pain level controlled Vital Signs Assessment: post-procedure vital signs reviewed and stable Respiratory status: spontaneous breathing, nonlabored ventilation, respiratory function stable and patient connected to nasal cannula oxygen Cardiovascular status: stable and blood pressure returned to baseline Postop Assessment: no apparent nausea or vomiting Anesthetic complications: no   No notable events documented.  Last Vitals:  Vitals:   03/18/21 1144 03/18/21 1445  BP: (!) 156/97 138/81  Pulse: 86 70  Resp: 19 14  Temp: 37.1 C 37.3 C  SpO2: 100% 100%    Last Pain:  Vitals:   03/18/21 1445  TempSrc: Oral  PainSc:                  Logan Washington

## 2021-03-18 NOTE — Progress Notes (Signed)
Report called to [facility]. Pt's belongings packed including cell phone & delivered to 1304. Floor staff of 3E told of pt's belongings in room.

## 2021-03-18 NOTE — Op Note (Signed)
[facility] Patient Name: Logan Washington Procedure Date: 03/18/2021 MRN: [id number] Attending MD: Vida Rigger , MD Date of Birth: 08-17-2000 CSN: [id number] Age: 21 Admit Type: Inpatient Procedure:                Upper GI endoscopy Indications:              Odynophagia Providers:                Vida Rigger, MD, Lorenza Evangelist, RN, Michele Mcalpine                            Technician Referring MD:              Medicines:                Propofol total dose 203 mg IV, 80 mg IV lidocaine Complications:            No immediate complications. Estimated Blood Loss:     Estimated blood loss: none. Procedure:                Pre-Anesthesia Assessment:                           - Prior to the procedure, a History and Physical                            was performed, and patient medications and                            allergies were reviewed. The patient's tolerance of                            previous anesthesia was also reviewed. The risks                            and benefits of the procedure and the sedation                            options and risks were discussed with the patient.                            All questions were answered, and informed consent                            was obtained. Prior Anticoagulants: The patient has                            taken no previous anticoagulant or antiplatelet                            agents. ASA Grade Assessment: II - A patient with                            mild systemic disease. After reviewing the risks  and benefits, the patient was deemed in                            satisfactory condition to undergo the procedure.                           After obtaining informed consent, the endoscope was                            passed under direct vision. Throughout the                            procedure, the patient's blood pressure, pulse, and                            oxygen saturations  were monitored continuously. The                            [id number]-[id number] ([id number] Olympus endoscope was introduced                            through the mouth, and advanced to the second part                            of duodenum. The upper GI endoscopy was                            accomplished without difficulty. The patient                            tolerated the procedure well. Scope In: Scope Out: Findings:      The larynx was normal.      The posterior oropharynx is erythematous with a few shallow ulcerations.      A Tiny hiatal hernia was present.      Localized and patchy mild inflammation characterized by erythema was       found in the gastric body.      The duodenal bulb, first portion of the duodenum and second portion of       the duodenum were normal.      The exam was otherwise without abnormality. Impression:               - Normal larynx.                           - The posterior oropharynx is erythematous.                           - Tiny hiatal hernia.                           - Gastritis.                           - Normal duodenal bulb, first portion of the  duodenum and second portion of the duodenum.                           - The examination was otherwise normal.                           - No specimens collected. Moderate Sedation:      Not Applicable - Patient had care per Anesthesia. Recommendation:           - Soft diet today. We will sign off and consider                            outpatient follow-up with ENT if patient continues                            to have problems                           - Continue present medications. Will change to once                            a day oral pantoprazole use Carafate viscous                            lidocaine or lozenges as needed                           - Return to GI clinic PRN.                           - Telephone GI clinic if symptomatic PRN. Procedure Code(s):         --- Professional ---                           [phone], Esophagogastroduodenoscopy, flexible,                            transoral; diagnostic, including collection of                            specimen(s) by brushing or washing, when performed                            (separate procedure) Diagnosis Code(s):        --- Professional ---                           J39.2, Other diseases of pharynx                           K44.9, Diaphragmatic hernia without obstruction or                            gangrene  K29.70, Gastritis, unspecified, without bleeding                           R13.10, Dysphagia, unspecified CPT copyright 2019 American Medical Association. All rights reserved. The codes documented in this report are preliminary and upon coder review may  be revised to meet current compliance requirements. Vida Rigger, MD 03/18/2021 10:02:03 AM This report has been signed electronically. Number of Addenda: 0

## 2021-03-19 ENCOUNTER — Encounter (HOSPITAL_COMMUNITY): Payer: Self-pay | Admitting: Gastroenterology

## 2021-03-19 LAB — BASIC METABOLIC PANEL
Anion gap: 10 (ref 5–15)
BUN: 12 mg/dL (ref 6–20)
CO2: 27 mmol/L (ref 22–32)
Calcium: 9.1 mg/dL (ref 8.9–10.3)
Chloride: 97 mmol/L — ABNORMAL LOW (ref 98–111)
Creatinine, Ser: 0.87 mg/dL (ref 0.61–1.24)
GFR, Estimated: >60 mL/min (ref >60)
Glucose, Bld: 275 mg/dL — ABNORMAL HIGH (ref 70–99)
Potassium: 3.9 mmol/L (ref 3.5–5.1)
Sodium: 134 mmol/L — ABNORMAL LOW (ref 135–145)

## 2021-03-19 LAB — GLUCOSE, CAPILLARY
Glucose-Capillary: 311 mg/dL — ABNORMAL HIGH (ref 70–99)
Glucose-Capillary: 218 mg/dL — ABNORMAL HIGH (ref 70–99)

## 2021-03-19 LAB — MAGNESIUM: Magnesium: 2 mg/dL (ref 1.7–2.4)

## 2021-03-19 MED ORDER — PANTOPRAZOLE SODIUM 40 MG PO TBEC: 40.0000 mg | DELAYED_RELEASE_TABLET | Freq: Every day | ORAL | 2 refills | Status: DC

## 2021-03-19 MED ORDER — SUCRALFATE 1 GM/10ML PO SUSP: 1.0000 g | Freq: Four times a day (QID) | ORAL | 1 refills | Status: DC

## 2021-03-19 NOTE — Plan of Care (Signed)
  Problem: Coping: Goal: Level of anxiety will decrease Outcome: Progressing   Problem: Pain Managment: Goal: General experience of comfort will improve Outcome: Progressing   

## 2021-03-19 NOTE — Discharge Summary (Signed)
Physician Discharge Summary  Kelvin Lunde YBR:[id number] DOB: 04-20-2000 DOA: 03/15/2021  PCP: Patient, No Pcp Per (Inactive)  Admit date: 03/15/2021 Discharge date: 03/19/2021  Time spent: 55 minutes  Recommendations for Outpatient Follow-up:  Follow-up with Chana Bode, PA endocrinology in 3 weeks.  On follow-up patient will need a CBC done to follow-up on counts.  Patient also need a basic metabolic profile done to follow-up on electrolytes and renal function.   Discharge Diagnoses:  Principal Problem:   DKA (diabetic ketoacidosis) (HCC) Active Problems:   DKA, type 1 (HCC)   AKI (acute kidney injury) (HCC)   Dehydration   Hematemesis   Leukocytosis   Hyperkalemia   Hypokalemia   Odynophagia   Discharge Condition: Stable and improved  Diet recommendation: Carb modified diet  Filed Weights   03/16/21 1720 03/18/21 0857  Weight: 68.7 kg 68.7 kg    History of present illness:   Logan Washington is a 21 y.o. male with medical history significant of type 1 diabetes (was at age 30) who stated recently incarcerated for 5 days ago for assault charges (communicating threats) and did not receive any insulin while she was in jail.  Patient states for the past 4 days he has become more nauseous, emesis, epigastric abdominal pain.  Patient states he has had some bouts of hematemesis as well as hemoptysis.  Patient endorses dizziness, lightheadedness, fatigue, lethargy, feeling unwell.  Patient does endorse some chest pain with emesis.  Patient complaining of esophageal pain.  Patient denies any fevers, no chills, no shortness of breath, no melena, no hematochezia, no dysuria, no diarrhea, no constipation.  Patient denied any difficulty breathing stated tested negative in COVID in jail.  Denies any urinary frequency or urgency.  Denies any physical assault.   ED Course: Patient seen in the ED, venous pH of 7.116, PCO2 of 31, PO2 of 41, bicarb of 9.5.  Basic metabolic profile with a sodium of  131, potassium of 5.4, bicarb of 9, glucose of 409, BUN of 31, creatinine of 1.50 otherwise was within normal limits.  CBC with differential done with a white count of 28.7, hemoglobin of 17.6, platelet count of 446.  Hydroxybutyric acid > 8.  Urinalysis with >1000, small hemoglobin, ketonuria, nitrite negative, leukocytes negative, protein of 30, but WBC 0-5.  Chest x-ray pending.  Hospital Course:  #1 DKA/type I diabetic -Patient presenting in DKA secondary to medication noncompliance as he was incarcerated in jail and stated did not receive any of his insulin. -Patient on admission noted to have a blood glucose level of 409 on basic metabolic profile, noted to be acidotic with a bicarb of 9, potassium of 5.4, anion gap of 19.  Beta hydroxybutyrate was elevated at > 8.  -Urinalysis with glycosuria and ketonuria. -Urinalysis nitrite negative, leukocytes negative.  Chest x-ray was negative with no acute abnormalities. -Hemoglobin A1c at 7.7 -Patient was started on the Endo tool/insulin drip in the ED which was continued.  -Anion gap is closed.   -Acidosis with significant improvement with bicarb currently at 24.   -Beta hydroxybutyrate trended down.   -Patient was subsequently transitioned from insulin drip back to home regimen of Semglee 31 units daily.   -Patient made n.p.o. after midnight in anticipation of upper endoscopy and as such Semglee was decreased to 12 units daily and readjusted back to 25 units post endoscopy.   -Patient be discharged back on home regimen of Semglee 31 units daily.   -Outpatient follow-up with primary endocrinologist.   2.  Hematemesis/hemoptysis -Patient with complaints of hematemesis prior to admission however no hematemesis noted in the ED. -Patient complained of some abdominal pain, esophageal pain likely secondary to nausea and emesis. -Patient noted to have EGD done 12/28/2019 which showed LA grade a reflux esophagitis with gastritis. -Patient noted not to be  compliant with PPI after upper endoscopy stating symptoms had improved and had resolved. -Hematemesis could be secondary to Mallory-Weiss tear. -Patient with no further hematemesis or hemoptysis during the hospitalization. -Patient was maintained on a PPI. -Patient with complaints of odynophagia, GI assess patient in consultation, patient underwent upper endoscopy with gastritis noted.  -Patient placed on Carafate.   -Patient was discharged home in stable and improved condition.  3.  Dehydration -Noted on lab work with elevated hemoglobin levels, thrombocytosis, leukocytosis which resolved with hydration.  4.  Leukocytosis Likely a reactive leukocytosis secondary to problem #1 and 2 and #3. -Patient afebrile. -Urinalysis nitrite negative leukocytes negative. -Chest x-ray with no acute infiltrate.. -Leukocytosis trended down and had resolved by day of discharge.    5.  Odynophagia -Patient with complaints of upper throat pain, difficulty swallowing food yesterday and as such diet downgraded to clear liquids which he is tolerating. -Patient initially stating throat may be closing. -Likely secondary to emesis, hematemesis prior to admission. -No exudate noted on oral examination. -Patient states some improvement with Carafate. -Patient seen in consultation by GI, underwent upper endoscopy 03/18/2021 which showed posterior oropharynx erythematous with few shallow ulcers, gastritis, tiny hiatal hernia. -Diet advanced to a soft diet per GI who recommended daily PPI. -Patient tolerated diet and patient will be discharged home on PPI daily. -Patient maintained on Carafate with clinical improvement will be discharged on 7 days of Carafate. -Outpatient follow-up with ENT if symptoms persist.  6.  History of esophagitis/gastritis -Patient with some complaints of odynophagia with food.  -Status post upper endoscopy 03/18/2021 which showed a posterior oropharynx erythematous with few shallow ulcers,  gastritis, tiny hiatial hernia.  -Patient maintained on PPI and sucralfate during the hospitalization. -Patient will be discharged on PPI. -Outpatient follow-up with PCP.  7.  Hyperkalemia>>> hypokalemia -Secondary to problem #1. -Hyperkalemia improved, patient subsequently became hypokalemic and potassium repleted.   -Electrolyte abnormalities had resolved by day of discharge.   -Outpatient follow-up.   8.  Acute kidney injury -Likely secondary to prerenal azotemia from GI losses. -Resolved with hydration.   -Outpatient follow-up.     Procedures: Chest x-ray 03/15/2021 Upper endoscopy 03/18/2021 per Dr. Ewing SchleinMagod    Consultations: Diabetes coordinator GI: Dr Ewing SchleinMagod 03/17/2021  Discharge Exam: Vitals:   03/19/21 0945 03/19/21 1206  BP:  136/84  Pulse: 90 97  Resp:  14  Temp:  98.8 F (37.1 C)  SpO2:  100%    General: NAD. Cardiovascular: Regular rate rhythm no murmurs rubs or gallops.  No JVD.  No lower extremity edema. Respiratory: Clear to auscultation bilaterally.  No wheezes, no crackles, no rhonchi.  Normal respiratory pattern.  Speaking in full sentences.  Discharge Instructions   Discharge Instructions     Diet Carb Modified   Complete by: As directed    Increase activity slowly   Complete by: As directed       Allergies as of 03/19/2021       Reactions   Tilapia [fish Allergy] Swelling   Mouth         Medication List     STOP taking these medications    doxycycline 100 MG capsule Commonly known as: VIBRAMYCIN  omeprazole 40 MG capsule Commonly known as: PRILOSEC   ondansetron 4 MG disintegrating tablet Commonly known as: ZOFRAN-ODT       TAKE these medications    B-D UF III MINI PEN NEEDLES 31G X 5 MM Misc Generic drug: Insulin Pen Needle FOR USE WITH INSULIN PENS   dicyclomine 20 MG tablet Commonly known as: BENTYL Take 1 tablet (20 mg total) by mouth 4 (four) times daily -  before meals and at bedtime.   Lantus SoloStar 100  UNIT/ML Solostar Pen Generic drug: insulin glargine Inject 25 Units into the skin at bedtime. What changed: how much to take   NovoLOG FlexPen 100 UNIT/ML FlexPen Generic drug: insulin aspart Inject 6-7 Units into the skin 3 (three) times daily with meals. Every 7 carbs =1 unit   ondansetron 4 MG tablet Commonly known as: ZOFRAN Take 1 tablet (4 mg total) by mouth every 8 (eight) hours as needed for nausea or vomiting.   pantoprazole 40 MG tablet Commonly known as: PROTONIX Take 1 tablet (40 mg total) by mouth daily. Start taking on: March 20, 2021   sucralfate 1 GM/10ML suspension Commonly known as: CARAFATE Take 10 mLs (1 g total) by mouth every 6 (six) hours for 7 days.       Allergies  Allergen Reactions   Tilapia [Fish Allergy] Swelling    Mouth     Follow-up Information     Laurence Ferrari, PA-C. Schedule an appointment as soon as possible for a visit in 3 week(s).   Specialty: Endocrinology Contact information: [address] [place] [address] [phone]                  The results of significant diagnostics from this hospitalization (including imaging, microbiology, ancillary and laboratory) are listed below for reference.    Significant Diagnostic Studies: DG Chest 2 View  Result Date: 03/15/2021 CLINICAL DATA:  DKA EXAM: CHEST - 2 VIEW COMPARISON:  November 2020 FINDINGS: The heart size and mediastinal contours are within normal limits. Both lungs are clear. No pleural effusion or pneumothorax. The visualized skeletal structures are unremarkable. IMPRESSION: No acute process in the chest. Electronically Signed   By: Guadlupe Spanish M.D.   On: 03/15/2021 18:09   CT Head Wo Contrast  Result Date: 02/19/2021 CLINICAL DATA:  Headache, sudden, severe EXAM: CT HEAD WITHOUT CONTRAST TECHNIQUE: Contiguous axial images were obtained from the base of the skull through the vertex without intravenous contrast. COMPARISON:  None. FINDINGS:  Mildly motion limited study. Brain: No evidence of acute infarction, hemorrhage, hydrocephalus, extra-axial collection or mass lesion/mass effect. Vascular: No hyperdense vessel identified. Skull: No acute fracture. Sinuses/Orbits: Mild paranasal sinus mucosal thickening. Unremarkable orbits. Other: No mastoid effusions. IMPRESSION: No evidence of acute intracranial abnormality. Electronically Signed   By: Feliberto Harts M.D.   On: 02/19/2021 18:07   CT ABDOMEN PELVIS W CONTRAST  Result Date: 02/19/2021 CLINICAL DATA:  Abdominal pain, acute, nonlocalized EXAM: CT ABDOMEN AND PELVIS WITH CONTRAST TECHNIQUE: Multidetector CT imaging of the abdomen and pelvis was performed using the standard protocol following bolus administration of intravenous contrast. CONTRAST:  OMNIPAQUE IOHEXOL 300 MG/ML  SOLN COMPARISON:  None. FINDINGS: Lower chest: No acute abnormality. Hepatobiliary: No focal liver abnormality. No gallstones, gallbladder wall thickening, or pericholecystic fluid. No biliary dilatation. Pancreas: No focal lesion. Normal pancreatic contour. No surrounding inflammatory changes. No main pancreatic ductal dilatation. Spleen: Normal in size without focal abnormality. Adrenals/Urinary Tract: No adrenal nodule bilaterally. Bilateral kidneys  enhance symmetrically. No hydronephrosis. No hydroureter. The urinary bladder is unremarkable. Stomach/Bowel: Stomach is within normal limits. No evidence of bowel wall thickening or dilatation. Appendix appears normal. Vascular/Lymphatic: No abdominal aorta or iliac aneurysm. No abdominal, pelvic, or inguinal lymphadenopathy. Reproductive: Prostate is unremarkable. Other: No intraperitoneal free fluid. No intraperitoneal free gas. No organized fluid collection. Musculoskeletal: No abdominal wall hernia or abnormality. No suspicious lytic or blastic osseous lesions. No acute displaced fracture. IMPRESSION: No acute intra-abdominal or intrapelvic abnormality.  Electronically Signed   By: Tish FredericksonMorgane  Naveau M.D.   On: 02/19/2021 18:07    Microbiology: Recent Results (from the past 240 hour(s))  Resp Panel by RT-PCR (Flu A&B, Covid) Nasopharyngeal Swab     Status: None   Collection Time: 03/15/21  1:34 PM   Specimen: Nasopharyngeal Swab; Nasopharyngeal(NP) swabs in vial transport medium  Result Value Ref Range Status   SARS Coronavirus 2 by RT PCR NEGATIVE NEGATIVE Final    Comment: (NOTE) SARS-CoV-2 target nucleic acids are NOT DETECTED.  The SARS-CoV-2 RNA is generally detectable in upper respiratory specimens during the acute phase of infection. The lowest concentration of SARS-CoV-2 viral copies this assay can detect is 138 copies/mL. A negative result does not preclude SARS-Cov-2 infection and should not be used as the sole basis for treatment or other patient management decisions. A negative result may occur with  improper specimen collection/handling, submission of specimen other than nasopharyngeal swab, presence of viral mutation(s) within the areas targeted by this assay, and inadequate number of viral copies(<138 copies/mL). A negative result must be combined with clinical observations, patient history, and epidemiological information. The expected result is Negative.  Fact Sheet for Patients:  BloggerCourse.comhttps://www.fda.gov/media/152166/download  Fact Sheet for Healthcare Providers:  SeriousBroker.ithttps://www.fda.gov/media/152162/download  This test is no t yet approved or cleared by the [place] FDA and  has been authorized for detection and/or diagnosis of SARS-CoV-2 by FDA under an Emergency Use Authorization (EUA). This EUA will remain  in effect (meaning this test can be used) for the duration of the COVID-19 declaration under Section 564(b)(1) of the Act, 21 U.S.C.section 360bbb-3(b)(1), unless the authorization is terminated  or revoked sooner.       Influenza A by PCR NEGATIVE NEGATIVE Final   Influenza B by PCR NEGATIVE NEGATIVE Final     Comment: (NOTE) The Xpert Xpress SARS-CoV-2/FLU/RSV plus assay is intended as an aid in the diagnosis of influenza from Nasopharyngeal swab specimens and should not be used as a sole basis for treatment. Nasal washings and aspirates are unacceptable for Xpert Xpress SARS-CoV-2/FLU/RSV testing.  Fact Sheet for Patients: BloggerCourse.comhttps://www.fda.gov/media/152166/download  Fact Sheet for Healthcare Providers: SeriousBroker.ithttps://www.fda.gov/media/152162/download  This test is not yet approved or cleared by the [place] FDA and has been authorized for detection and/or diagnosis of SARS-CoV-2 by FDA under an Emergency Use Authorization (EUA). This EUA will remain in effect (meaning this test can be used) for the duration of the COVID-19 declaration under Section 564(b)(1) of the Act, 21 U.S.C. section 360bbb-3(b)(1), unless the authorization is terminated or revoked.  Performed at [facility], [address] [place], [place] [id number]   MRSA Next Gen by PCR, Nasal     Status: None   Collection Time: 03/15/21  6:30 PM   Specimen: Nasal Mucosa; Nasal Swab  Result Value Ref Range Status   MRSA by PCR Next Gen NOT DETECTED NOT DETECTED Final    Comment: (NOTE) The GeneXpert MRSA Assay (FDA approved for NASAL specimens only), is one component of  a comprehensive MRSA colonization surveillance program. It is not intended to diagnose MRSA infection nor to guide or monitor treatment for MRSA infections. Test performance is not FDA approved in patients less than 59 years old. Performed at [facility], [address], [place], [place] [address]      Labs: Basic Metabolic Panel: Recent Labs  Lab 03/15/21 1923 03/15/21 2110 03/16/21 0041 03/16/21 0517 03/17/21 0242 03/17/21 0326 03/18/21 0247 03/18/21 1548 03/19/21 0413  NA  --    < > 133* 132* 132*  --  133*  --  134*  K  --    < > 3.8 3.4* 2.9*  --  2.8* 3.4* 3.9  CL  --    < > 109 104 101  --  97*   --  97*  CO2  --    < > 17* 20* 24  --  27  --  27  GLUCOSE  --    < > 174* 141* 104*  --  182*  --  275*  BUN  --    < > 21* 18 9  --  7  --  12  CREATININE  --    < > 1.00 0.89 0.90  --  0.83  --  0.87  CALCIUM  --    < > 9.1 8.8* 8.3*  --  8.9  --  9.1  MG 1.9  --   --   --   --  1.9 1.8  --  2.0  PHOS 1.5*  --   --  3.4  --   --   --   --   --    < > = values in this interval not displayed.   Liver Function Tests: No results for input(s): AST, ALT, ALKPHOS, BILITOT, PROT, ALBUMIN in the last 168 hours. No results for input(s): LIPASE, AMYLASE in the last 168 hours. No results for input(s): AMMONIA in the last 168 hours. CBC: Recent Labs  Lab 03/15/21 1334 03/16/21 0517 03/17/21 0242 03/18/21 0247  WBC 28.7* 21.3* 10.8* 9.8  NEUTROABS 24.5* 17.8* 7.6  --   HGB 17.6* 14.7 13.7 15.1  HCT 52.7* 41.6 39.1 42.8  MCV 87.1 83.7 84.6 82.9  PLT 446* 352 315 301   Cardiac Enzymes: No results for input(s): CKTOTAL, CKMB, CKMBINDEX, TROPONINI in the last 168 hours. BNP: BNP (last 3 results) No results for input(s): BNP in the last 8760 hours.  ProBNP (last 3 results) No results for input(s): PROBNP in the last 8760 hours.  CBG: Recent Labs  Lab 03/18/21 1118 03/18/21 1625 03/18/21 2117 03/19/21 0739 03/19/21 1108  GLUCAP 95 216* 317* 311* 218*       Signed:  Ramiro Harvest MD.  [facility] 03/19/2021, 1:46 PM

## 2021-03-20 NOTE — Addendum Note (Signed)
Addendum  created 03/20/21 1548 by Val Eagle, MD   Attestation recorded in Intraprocedure, Intraprocedure Attestations filed

## 2021-10-10 ENCOUNTER — Emergency Department (HOSPITAL_BASED_OUTPATIENT_CLINIC_OR_DEPARTMENT_OTHER)
Admission: EM | Admit: 2021-10-10 | Discharge: 2021-10-10 | Disposition: A | Discharge status: Home / Self Care | Payer: Medicaid Other | Attending: Emergency Medicine | Admitting: Emergency Medicine

## 2021-10-10 ENCOUNTER — Encounter (HOSPITAL_BASED_OUTPATIENT_CLINIC_OR_DEPARTMENT_OTHER): Payer: Self-pay

## 2021-10-10 ENCOUNTER — Encounter (HOSPITAL_BASED_OUTPATIENT_CLINIC_OR_DEPARTMENT_OTHER): Payer: Self-pay | Admitting: Emergency Medicine

## 2021-10-10 ENCOUNTER — Other Ambulatory Visit: Payer: Self-pay

## 2021-10-10 ENCOUNTER — Emergency Department (HOSPITAL_BASED_OUTPATIENT_CLINIC_OR_DEPARTMENT_OTHER)
Admission: EM | Admit: 2021-10-10 | Discharge: 2021-10-10 | Disposition: A | Discharge status: Home / Self Care | Payer: Medicaid Other | Source: Home / Self Care | Attending: Emergency Medicine | Admitting: Emergency Medicine

## 2021-10-10 DIAGNOSIS — Z794 Long term (current) use of insulin: Secondary | ICD-10-CM | POA: Insufficient documentation

## 2021-10-10 DIAGNOSIS — K920 Hematemesis: Secondary | ICD-10-CM | POA: Insufficient documentation

## 2021-10-10 DIAGNOSIS — K1379 Other lesions of oral mucosa: Secondary | ICD-10-CM | POA: Diagnosis present

## 2021-10-10 DIAGNOSIS — Z98818 Other dental procedure status: Secondary | ICD-10-CM | POA: Insufficient documentation

## 2021-10-10 DIAGNOSIS — R10817 Generalized abdominal tenderness: Secondary | ICD-10-CM | POA: Insufficient documentation

## 2021-10-10 DIAGNOSIS — E1065 Type 1 diabetes mellitus with hyperglycemia: Secondary | ICD-10-CM | POA: Insufficient documentation

## 2021-10-10 LAB — CBC WITH DIFFERENTIAL/PLATELET
Abs Immature Granulocytes: 0.06 10*3/uL (ref 0.00–0.07)
Abs Immature Granulocytes: 0.05 10*3/uL (ref 0.00–0.07)
Basophils Absolute: 0 10*3/uL (ref 0.0–0.1)
Basophils Absolute: 0 10*3/uL (ref 0.0–0.1)
Basophils Relative: 0 %
Basophils Relative: 0 %
Eosinophils Absolute: 0 10*3/uL (ref 0.0–0.5)
Eosinophils Absolute: 0 10*3/uL (ref 0.0–0.5)
Eosinophils Relative: 0 %
Eosinophils Relative: 0 %
HCT: 44.7 % (ref 39.0–52.0)
HCT: 40.6 % (ref 39.0–52.0)
Hemoglobin: 16 g/dL (ref 13.0–17.0)
Hemoglobin: 14.2 g/dL (ref 13.0–17.0)
Immature Granulocytes: 0 %
Immature Granulocytes: 0 %
Lymphocytes Relative: 6 %
Lymphocytes Relative: 6 %
Lymphs Abs: 0.9 10*3/uL (ref 0.7–4.0)
Lymphs Abs: 1.1 10*3/uL (ref 0.7–4.0)
MCH: 29.7 pg (ref 26.0–34.0)
MCH: 29.5 pg (ref 26.0–34.0)
MCHC: 35.8 g/dL (ref 30.0–36.0)
MCHC: 35 g/dL (ref 30.0–36.0)
MCV: 82.9 fL (ref 80.0–100.0)
MCV: 84.2 fL (ref 80.0–100.0)
Monocytes Absolute: 0.2 10*3/uL (ref 0.1–1.0)
Monocytes Absolute: 1.1 10*3/uL — ABNORMAL HIGH (ref 0.1–1.0)
Monocytes Relative: 1 %
Monocytes Relative: 6 %
Neutro Abs: 14.9 10*3/uL — ABNORMAL HIGH (ref 1.7–7.7)
Neutro Abs: 15.8 10*3/uL — ABNORMAL HIGH (ref 1.7–7.7)
Neutrophils Relative %: 93 %
Neutrophils Relative %: 88 %
Platelets: 367 10*3/uL (ref 150–400)
Platelets: 362 10*3/uL (ref 150–400)
RBC: 5.39 MIL/uL (ref 4.22–5.81)
RBC: 4.82 MIL/uL (ref 4.22–5.81)
RDW: 12.1 % (ref 11.5–15.5)
RDW: 12.1 % (ref 11.5–15.5)
WBC: 16.1 10*3/uL — ABNORMAL HIGH (ref 4.0–10.5)
WBC: 18.1 10*3/uL — ABNORMAL HIGH (ref 4.0–10.5)
nRBC: 0 % (ref 0.0–0.2)
nRBC: 0 % (ref 0.0–0.2)

## 2021-10-10 LAB — COMPREHENSIVE METABOLIC PANEL
ALT: 19 U/L (ref 0–44)
AST: 33 U/L (ref 15–41)
Albumin: 4.2 g/dL (ref 3.5–5.0)
Alkaline Phosphatase: 80 U/L (ref 38–126)
Anion gap: 16 — ABNORMAL HIGH (ref 5–15)
BUN: 18 mg/dL (ref 6–20)
CO2: 21 mmol/L — ABNORMAL LOW (ref 22–32)
Calcium: 9.1 mg/dL (ref 8.9–10.3)
Chloride: 97 mmol/L — ABNORMAL LOW (ref 98–111)
Creatinine, Ser: 1.21 mg/dL (ref 0.61–1.24)
GFR, Estimated: >60 mL/min (ref >60)
Glucose, Bld: 379 mg/dL — ABNORMAL HIGH (ref 70–99)
Potassium: 4.4 mmol/L (ref 3.5–5.1)
Sodium: 134 mmol/L — ABNORMAL LOW (ref 135–145)
Total Bilirubin: 1.6 mg/dL — ABNORMAL HIGH (ref 0.3–1.2)
Total Protein: 7.3 g/dL (ref 6.5–8.1)

## 2021-10-10 MED ORDER — ONDANSETRON HCL 4 MG/2ML IJ SOLN
4.0000 mg | Freq: Once | INTRAMUSCULAR | Status: AC
Start: 2021-10-10 — End: 2021-10-10
  Administered 2021-10-10: 4 mg via INTRAVENOUS
  Filled 2021-10-10: qty 2

## 2021-10-10 MED ORDER — SODIUM CHLORIDE 0.9 % IV BOLUS
1000.0000 mL | Freq: Once | INTRAVENOUS | Status: AC
Start: 2021-10-10 — End: 2021-10-10
  Administered 2021-10-10: 1000 mL via INTRAVENOUS

## 2021-10-10 MED ORDER — TRANEXAMIC ACID 1000 MG/10ML IV SOLN
500.0000 mg | Freq: Once | INTRAVENOUS | Status: AC
  Administered 2021-10-10: 500 mg via TOPICAL
  Filled 2021-10-10: qty 10

## 2021-10-10 MED ORDER — ONDANSETRON HCL 4 MG PO TABS: 4.0000 mg | ORAL_TABLET | Freq: Four times a day (QID) | ORAL | 0 refills | Status: DC

## 2021-10-10 MED ORDER — ONDANSETRON HCL 4 MG/2ML IJ SOLN
4.0000 mg | Freq: Once | INTRAMUSCULAR | Status: AC
  Administered 2021-10-10: 4 mg via INTRAVENOUS
  Filled 2021-10-10: qty 2

## 2021-10-10 NOTE — ED Triage Notes (Signed)
Pt had 5 teeth removal today , severe bleeding afterward . Pt swallowed large amount of blood. Reports vomited blood 4 times . Type 1 diabetes.

## 2021-10-10 NOTE — ED Provider Notes (Signed)
[facility] EMERGENCY DEPARTMENT Provider Note   CSN: [id number] Arrival date & time: 10/10/21  1310     History  Chief Complaint  Patient presents with   Dental Problem    Makoa Satz is a 21 y.o. male.  Patient is a 21 year old male postop tooth removal.  Patient states he had 5 teeth removed this morning at [facility].  States he had significant bleeding from his mouth on the lower left jaw which prompted him to call the ambulance to bring him to the emergency department.  The history is provided by the patient. No language interpreter was used.       Home Medications Prior to Admission medications   Medication Sig Start Date End Date Taking? Authorizing Provider  B-D UF III MINI PEN NEEDLES 31G X 5 MM MISC FOR USE WITH INSULIN PENS 10/12/15   [provider]  dicyclomine (BENTYL) 20 MG tablet Take 1 tablet (20 mg total) by mouth 4 (four) times daily -  before meals and at bedtime. Patient not taking: Reported on 03/15/2021 11/17/19   Arnaldo Natal, NP  LANTUS SOLOSTAR 100 UNIT/ML Solostar Pen Inject 25 Units into the skin at bedtime. Patient taking differently: Inject 31 Units into the skin at bedtime. 01/25/18   Osvaldo Shipper, MD  NOVOLOG FLEXPEN 100 UNIT/ML FlexPen Inject 6-7 Units into the skin 3 (three) times daily with meals. Every 7 carbs =1 unit 10/09/15   [provider]  ondansetron (ZOFRAN) 4 MG tablet Take 1 tablet (4 mg total) by mouth every 8 (eight) hours as needed for nausea or vomiting. Patient not taking: Reported on 03/15/2021 11/17/19   Arnaldo Natal, NP  pantoprazole (PROTONIX) 40 MG tablet Take 1 tablet (40 mg total) by mouth daily. 03/20/21   Rodolph Bong, MD  sucralfate (CARAFATE) 1 GM/10ML suspension Take 10 mLs (1 g total) by mouth every 6 (six) hours for 7 days. 03/19/21 03/26/21  Rodolph Bong, MD      Allergies    Tilapia [fish allergy]    Review of Systems   Review of  Systems  Constitutional:  Negative for chills and fever.  HENT:  Positive for dental problem. Negative for ear pain and sore throat.   Eyes:  Negative for pain and visual disturbance.  Respiratory:  Negative for cough and shortness of breath.   Cardiovascular:  Negative for chest pain and palpitations.  Gastrointestinal:  Negative for abdominal pain and vomiting.  Genitourinary:  Negative for dysuria and hematuria.  Musculoskeletal:  Negative for arthralgias and back pain.  Skin:  Negative for color change and rash.  Neurological:  Negative for seizures and syncope.  All other systems reviewed and are negative.   Physical Exam Updated Vital Signs BP 104/84   Pulse 80   Resp 16   Ht 5\' 9"  (1.753 m)   Wt 68 kg   SpO2 95%   BMI 22.14 kg/m  Physical Exam Vitals and nursing note reviewed.  Constitutional:      General: He is not in acute distress.    Appearance: He is well-developed.  HENT:     Head: Normocephalic and atraumatic.     Mouth/Throat:   Eyes:     Conjunctiva/sclera: Conjunctivae normal.  Cardiovascular:     Rate and Rhythm: Normal rate and regular rhythm.     Heart sounds: No murmur heard. Pulmonary:     Effort: Pulmonary effort is normal. No respiratory distress.  Breath sounds: Normal breath sounds.  Abdominal:     Palpations: Abdomen is soft.     Tenderness: There is no abdominal tenderness.  Musculoskeletal:        General: No swelling.     Cervical back: Neck supple.  Skin:    General: Skin is warm and dry.     Capillary Refill: Capillary refill takes less than 2 seconds.  Neurological:     Mental Status: He is alert.  Psychiatric:        Mood and Affect: Mood normal.     ED Results / Procedures / Treatments   Labs (all labs ordered are listed, but only abnormal results are displayed) Labs Reviewed  CBC WITH DIFFERENTIAL/PLATELET - Abnormal; Notable for the following components:      Result Value   WBC 16.1 (*)    Neutro Abs 14.9 (*)     All other components within normal limits    EKG None  Radiology No results found.  Procedures Procedures    Medications Ordered in ED Medications  sodium chloride 0.9 % bolus 1,000 mL (1,000 mLs Intravenous New Bag/Given 10/10/21 1337)  ondansetron (ZOFRAN) injection 4 mg (4 mg Intravenous Given 10/10/21 1336)  tranexamic acid (CYKLOKAPRON) injection 500 mg (500 mg Topical Given by Other 10/10/21 1342)    ED Course/ Medical Decision Making/ A&P                           Medical Decision Making Amount and/or Complexity of Data Reviewed Labs: ordered.  Risk Prescription drug management.   67:31PM 21 year old male postop tooth extraction x5 at [facility] this morning presenting for mouth bleeding.  Patient has significant bleeding on exam saturating several gauze pads.  Patient given Ollen Barges and suction to help prevent swallowing blood.  I have contacted patient's oral maxillary facial surgeon who states he can see the patient in the office stating they can help control the bleeding..  Currently calling patient's family members to get him a ride over.  In the meantime gauze has been soaked in TXA in an attempt to decrease bleeding.  Psych labs sent to have a baseline hemoglobin.    2:28 PM Father family at bedside. Has put out approximately 500 mL of blood.  States he will take him directly to the surgery center.  I spoke with the physicians who are awaiting his arrival.  Patient recommended to return to emergency department immediately for repeat hemoglobin if he develops lightheadedness, dizziness, or syncopal symptoms after bleeding is controlled by surgeon.  EMTALA completed. Pt will go by POV.         Final Clinical Impression(s) / ED Diagnoses Final diagnoses:  Status post wisdom tooth extraction  Oral bleeding    Rx / DC Orders ED Discharge Orders     None         Franne Forts, DO 10/10/21 1428

## 2021-10-10 NOTE — Discharge Instructions (Signed)
You were seen in the emergency department today for vomiting.  I have prescribed you Zofran which you can use every 6 hours.  Please return if you have significant bleeding from your mouth again.  Please continue to drink fluids and follow the diet given to you by the oral surgeon.

## 2021-10-10 NOTE — Discharge Instructions (Signed)
Go directly to [facility] where patient had his procedure performed this morning. Doctor is waiting for you arrival.  Return to emergency department for recheck of hemoglobin if patient is experiencing lightheadedness or dizziness.

## 2021-10-10 NOTE — ED Notes (Signed)
Patient discharged and instructed to go directly to oral surgeon. Uncle driving patient. Gauze provided for transport.

## 2021-10-10 NOTE — ED Provider Notes (Signed)
[facility] EMERGENCY DEPARTMENT Provider Note   CSN: [id number] Arrival date & time: 10/10/21  1903     History  Chief Complaint  Patient presents with   Emesis    Logan Washington is a 21 y.o. male.  With past medical history of type 1 diabetes who presents to the emergency department with hematemesis.  Patient was seen earlier today in the emergency department for oral bleeding after having his wisdom teeth extracted today.  He had 5 teeth removed this morning at [facility].  Afterward he had significant bleeding from his mouth.  He called 911 and was presented to [facility] emergency department.  Here he was given a liter IV fluid, Zofran, TXA as he had approximately 500 mL's of bloody output here in the emergency department.  The physician called [facility] and the patient was sent by private vehicle back for sutures to be placed. States that since then he has had about 4 episodes of blood vomiting. States he is having abdominal cramping and nausea.    Emesis Associated symptoms: abdominal pain        Home Medications Prior to Admission medications   Medication Sig Start Date End Date Taking? Authorizing Provider  ondansetron (ZOFRAN) 4 MG tablet Take 1 tablet (4 mg total) by mouth every 6 (six) hours. 10/10/21  Yes Cristopher Peru, PA-C  B-D UF III MINI PEN NEEDLES 31G X 5 MM MISC FOR USE WITH INSULIN PENS 10/12/15   [provider]  dicyclomine (BENTYL) 20 MG tablet Take 1 tablet (20 mg total) by mouth 4 (four) times daily -  before meals and at bedtime. Patient not taking: Reported on 03/15/2021 11/17/19   Arnaldo Natal, NP  LANTUS SOLOSTAR 100 UNIT/ML Solostar Pen Inject 25 Units into the skin at bedtime. Patient taking differently: Inject 31 Units into the skin at bedtime. 01/25/18   Osvaldo Shipper, MD  NOVOLOG FLEXPEN 100 UNIT/ML FlexPen Inject 6-7 Units into the skin 3 (three) times daily with meals. Every 7 carbs =1  unit 10/09/15   [provider]  pantoprazole (PROTONIX) 40 MG tablet Take 1 tablet (40 mg total) by mouth daily. 03/20/21   Rodolph Bong, MD  sucralfate (CARAFATE) 1 GM/10ML suspension Take 10 mLs (1 g total) by mouth every 6 (six) hours for 7 days. 03/19/21 03/26/21  Rodolph Bong, MD      Allergies    Tilapia [fish allergy]    Review of Systems   Review of Systems  HENT:  Positive for dental problem.   Gastrointestinal:  Positive for abdominal pain, nausea and vomiting.       Hematemesis  All other systems reviewed and are negative.   Physical Exam Updated Vital Signs BP (!) 153/100   Pulse 92   Temp 98.3 F (36.8 C) (Oral)   Resp 18   SpO2 99%  Physical Exam Vitals and nursing note reviewed.  Constitutional:      General: He is not in acute distress.    Appearance: He is normal weight. He is ill-appearing. He is not toxic-appearing.  HENT:     Head: Normocephalic.     Mouth/Throat:     Mouth: Mucous membranes are moist.     Dentition: Dental tenderness present.     Comments: Small amount of venous blood in the left lower post-operative site  Eyes:     Extraocular Movements: Extraocular movements intact.     Pupils: Pupils are equal,  round, and reactive to light.  Cardiovascular:     Rate and Rhythm: Normal rate and regular rhythm.     Pulses: Normal pulses.     Heart sounds: No murmur heard. Pulmonary:     Effort: Pulmonary effort is normal. No respiratory distress.     Breath sounds: No stridor.  Abdominal:     General: Abdomen is flat. Bowel sounds are normal. There is no distension.     Palpations: Abdomen is soft.     Tenderness: There is generalized abdominal tenderness.  Musculoskeletal:        General: Normal range of motion.     Cervical back: Neck supple.  Skin:    General: Skin is warm and dry.     Capillary Refill: Capillary refill takes less than 2 seconds.     Coloration: Skin is not pale.  Neurological:     General: No focal  deficit present.     Mental Status: He is alert and oriented to person, place, and time. Mental status is at baseline.  Psychiatric:        Mood and Affect: Mood normal.        Behavior: Behavior normal.        Thought Content: Thought content normal.        Judgment: Judgment normal.     ED Results / Procedures / Treatments   Labs (all labs ordered are listed, but only abnormal results are displayed) Labs Reviewed  CBC WITH DIFFERENTIAL/PLATELET - Abnormal; Notable for the following components:      Result Value   WBC 18.1 (*)    Neutro Abs 15.8 (*)    Monocytes Absolute 1.1 (*)    All other components within normal limits  COMPREHENSIVE METABOLIC PANEL - Abnormal; Notable for the following components:   Sodium 134 (*)    Chloride 97 (*)    CO2 21 (*)    Glucose, Bld 379 (*)    Total Bilirubin 1.6 (*)    Anion gap 16 (*)    All other components within normal limits   EKG None  Radiology No results found.  Procedures Procedures   Medications Ordered in ED Medications  ondansetron (ZOFRAN) injection 4 mg (4 mg Intravenous Given 10/10/21 1957)    ED Course/ Medical Decision Making/ A&P                           Medical Decision Making Amount and/or Complexity of Data Reviewed Labs: ordered.  Risk Prescription drug management.  This patient presents to the ED with chief complaint(s) of hematemesis with pertinent past medical history of type 1 diabetes which further complicates the presenting complaint. The complaint involves an extensive differential diagnosis and also carries with it a high risk of complications and morbidity.    The differential diagnosis includes GI bleed, recent oral surgery  Additional history obtained: Additional history obtained from family Records reviewed Care Everywhere/External Records and Primary Care Documents ED physician note from today  ED Course and Reassessment: 21 year old male who presents to the emergency department with  hematemesis. On chart review, he was seen earlier today after having oral bleeding from recent wisdom tooth extraction.  He did lose about half a liter of blood during this event.  He was seen by the oral surgeon and had sutures placed On my exam there is no further oral bleeding. I do see a bag in the room where he has had small-volume hematemesis.  I suspect that this is likely from swallowing blood from his recent oral bleeding. He was given Zofran with no further hematemesis.  His abdomen is soft and nontender.  No peritonitic findings.  He is hemodynamically stable. Hemoglobin has dropped 2 g since earlier today.  This is likely the acute bleed he had on his previous visit to the emergency department.  Bleeding is controlled. Regarding his CMP findings.  Likely ketosis in the setting of recent nausea and vomiting rather than DKA.  He does have elevated glucose.  I discussed this with the attending, Dr. Adela Lank, who does not recommend further work-up at this time.  Patient is having control of his nausea and vomiting with Zofran.  He passed a p.o. trial here with 2 ginger ale.  Feel that he can likely go home and normalize on his own with insulin and diet.  He is agreeable to this plan.  I will send a prescription for Zofran.  He is given strict return precautions if he rebleeds from his mouth or has worsening abdominal pain or recurrent hematemesis.  After being evaluated for 3 hours has had no further hematemesis and feel that he is safe for discharge.  Independent labs interpretation:  The following labs were independently interpreted: Hemoglobin at 14.2 which is decreased from earlier today of 16.  Leukocytosis to 18, likely reactive in the setting of oral surgery.  CMP with a anion gap and decreased bicarb.  He does have elevated glucose and is a type I diabetic however feel that his metabolic abnormalities may be from ketosis in the setting of recent nausea and vomiting rather than  DKA.  Independent visualization of imaging: N/A  Consultation: - Consulted or discussed management/test interpretation w/ external professional: N/A  Consideration for admission or further workup: Consider further work-up for possible DKA.  Discussed the case with Dr. Adela Lank, attending, who recommends Zofran, p.o. trial and discharge. Social Determinants of health: None identified Final Clinical Impression(s) / ED Diagnoses Final diagnoses:  Hematemesis with nausea    Rx / DC Orders ED Discharge Orders          Ordered    ondansetron (ZOFRAN) 4 MG tablet  Every 6 hours        10/10/21 2215              Cristopher Peru, PA-C 10/10/21 2301    Melene Plan, DO 10/10/21 2330

## 2021-10-10 NOTE — ED Triage Notes (Signed)
Had 5 teeth pulled this morning, mouth bleeding significantly since, unable to stop. Changed gauze 4 times since in triage. Tearful d/t pain.

## 2021-10-11 ENCOUNTER — Encounter (HOSPITAL_BASED_OUTPATIENT_CLINIC_OR_DEPARTMENT_OTHER): Payer: Self-pay | Admitting: Emergency Medicine

## 2021-10-11 ENCOUNTER — Emergency Department (HOSPITAL_BASED_OUTPATIENT_CLINIC_OR_DEPARTMENT_OTHER)
Admission: EM | Admit: 2021-10-11 | Discharge: 2021-10-11 | Disposition: A | Discharge status: Home / Self Care | Payer: Medicaid Other | Attending: Emergency Medicine | Admitting: Emergency Medicine

## 2021-10-11 DIAGNOSIS — E1010 Type 1 diabetes mellitus with ketoacidosis without coma: Secondary | ICD-10-CM

## 2021-10-11 DIAGNOSIS — K920 Hematemesis: Secondary | ICD-10-CM

## 2021-10-11 LAB — I-STAT VENOUS BLOOD GAS, ED
Acid-base deficit: 9 mmol/L — ABNORMAL HIGH (ref 0.0–2.0)
Bicarbonate: 16.2 mmol/L — ABNORMAL LOW (ref 20.0–28.0)
Calcium, Ion: 1.2 mmol/L (ref 1.15–1.40)
HCT: 45 % (ref 39.0–52.0)
Hemoglobin: 15.3 g/dL (ref 13.0–17.0)
O2 Saturation: 73 %
Patient temperature: 97.9
Potassium: 4.9 mmol/L (ref 3.5–5.1)
Sodium: 134 mmol/L — ABNORMAL LOW (ref 135–145)
TCO2: 17 mmol/L — ABNORMAL LOW (ref 22–32)
pCO2, Ven: 31.3 mmHg — ABNORMAL LOW (ref 44–60)
pH, Ven: 7.319 (ref 7.25–7.43)
pO2, Ven: 40 mmHg (ref 32–45)

## 2021-10-11 LAB — CBG MONITORING, ED
Glucose-Capillary: 405 mg/dL — ABNORMAL HIGH (ref 70–99)
Glucose-Capillary: 298 mg/dL — ABNORMAL HIGH (ref 70–99)
Glucose-Capillary: 212 mg/dL — ABNORMAL HIGH (ref 70–99)
Glucose-Capillary: 195 mg/dL — ABNORMAL HIGH (ref 70–99)

## 2021-10-11 LAB — CBC WITH DIFFERENTIAL/PLATELET
Abs Immature Granulocytes: 0.11 10*3/uL — ABNORMAL HIGH (ref 0.00–0.07)
Basophils Absolute: 0 10*3/uL (ref 0.0–0.1)
Basophils Relative: 0 %
Eosinophils Absolute: 0 10*3/uL (ref 0.0–0.5)
Eosinophils Relative: 0 %
HCT: 41.4 % (ref 39.0–52.0)
Hemoglobin: 14.3 g/dL (ref 13.0–17.0)
Immature Granulocytes: 1 %
Lymphocytes Relative: 8 %
Lymphs Abs: 1.7 10*3/uL (ref 0.7–4.0)
MCH: 28.9 pg (ref 26.0–34.0)
MCHC: 34.5 g/dL (ref 30.0–36.0)
MCV: 83.8 fL (ref 80.0–100.0)
Monocytes Absolute: 1.1 10*3/uL — ABNORMAL HIGH (ref 0.1–1.0)
Monocytes Relative: 5 %
Neutro Abs: 19 10*3/uL — ABNORMAL HIGH (ref 1.7–7.7)
Neutrophils Relative %: 86 %
Platelets: 394 10*3/uL (ref 150–400)
RBC: 4.94 MIL/uL (ref 4.22–5.81)
RDW: 12.3 % (ref 11.5–15.5)
WBC: 22 10*3/uL — ABNORMAL HIGH (ref 4.0–10.5)
nRBC: 0 % (ref 0.0–0.2)

## 2021-10-11 LAB — BASIC METABOLIC PANEL
Anion gap: 21 — ABNORMAL HIGH (ref 5–15)
Anion gap: 11 (ref 5–15)
BUN: 22 mg/dL — ABNORMAL HIGH (ref 6–20)
BUN: 21 mg/dL — ABNORMAL HIGH (ref 6–20)
CO2: 14 mmol/L — ABNORMAL LOW (ref 22–32)
CO2: 20 mmol/L — ABNORMAL LOW (ref 22–32)
Calcium: 9.6 mg/dL (ref 8.9–10.3)
Calcium: 9.1 mg/dL (ref 8.9–10.3)
Chloride: 99 mmol/L (ref 98–111)
Chloride: 105 mmol/L (ref 98–111)
Creatinine, Ser: 1.33 mg/dL — ABNORMAL HIGH (ref 0.61–1.24)
Creatinine, Ser: 1.03 mg/dL (ref 0.61–1.24)
GFR, Estimated: >60 mL/min (ref >60)
GFR, Estimated: >60 mL/min (ref >60)
Glucose, Bld: 440 mg/dL — ABNORMAL HIGH (ref 70–99)
Glucose, Bld: 224 mg/dL — ABNORMAL HIGH (ref 70–99)
Potassium: 4.8 mmol/L (ref 3.5–5.1)
Potassium: 4.3 mmol/L (ref 3.5–5.1)
Sodium: 134 mmol/L — ABNORMAL LOW (ref 135–145)
Sodium: 136 mmol/L (ref 135–145)

## 2021-10-11 MED ORDER — DEXTROSE IN LACTATED RINGERS 5 % IV SOLN: INTRAVENOUS | Status: DC

## 2021-10-11 MED ORDER — POTASSIUM CHLORIDE 10 MEQ/100ML IV SOLN
10.0000 meq | INTRAVENOUS | Status: AC
  Administered 2021-10-11 (×2): 10 meq via INTRAVENOUS
  Filled 2021-10-11 (×2): qty 100

## 2021-10-11 MED ORDER — ONDANSETRON HCL 4 MG/2ML IJ SOLN
4.0000 mg | Freq: Once | INTRAMUSCULAR | Status: AC
  Administered 2021-10-11: 4 mg via INTRAVENOUS
  Filled 2021-10-11: qty 2

## 2021-10-11 MED ORDER — DEXTROSE 50 % IV SOLN: 0.0000 mL | INTRAVENOUS | Status: DC | PRN

## 2021-10-11 MED ORDER — LACTATED RINGERS IV BOLUS
20.0000 mL/kg | Freq: Once | INTRAVENOUS | Status: AC
  Administered 2021-10-11: 1360 mL via INTRAVENOUS

## 2021-10-11 MED ORDER — LACTATED RINGERS IV SOLN: INTRAVENOUS | Status: DC

## 2021-10-11 MED ORDER — LACTATED RINGERS IV BOLUS
1000.0000 mL | Freq: Once | INTRAVENOUS | Status: AC
Start: 2021-10-11 — End: 2021-10-11
  Administered 2021-10-11: 1000 mL via INTRAVENOUS

## 2021-10-11 MED ORDER — INSULIN REGULAR(HUMAN) IN NACL 100-0.9 UT/100ML-% IV SOLN
INTRAVENOUS | Status: DC
  Administered 2021-10-11: 9.5 [IU]/h via INTRAVENOUS
  Filled 2021-10-11: qty 100

## 2021-10-11 NOTE — ED Notes (Signed)
Called pts guardian uncle to review discharge. States understanding and pts aunt will be picking him up

## 2021-10-11 NOTE — ED Provider Notes (Signed)
Had extensive recent dental work.  He was having problems with vomiting and bleeding.  Bleeding was treated at the oral surgeons office.  However he continued to have some nausea and vomiting afterwards.  At this point patient is being rehydrated and treated with IV insulin.  Plan will be to reassess metabolic panel and patient condition.  Anticipate discharge. Physical Exam  BP (!) 142/77   Pulse (!) 105   Temp 97.7 F (36.5 C)   Resp 17   Ht 5\' 9"  (1.753 m)   Wt 68 kg   SpO2 100%   BMI 22.14 kg/m   Physical Exam Patient is alert and nontoxic.  No respiratory distress.  Mental status is clear.  He has some diffuse facial swelling at the jaws but does not have trismus and can open his mouth.  No tongue swelling. Procedures  Procedures  ED Course / MDM   Clinical Course as of 10/11/21 0833  Thu Oct 11, 2021  0247 VBG with mild acidosis.  [CS]  0250 CBC without significant change from previous.  [CS]  0355 BMP shows a more elevated anion gap and lower bicarb from prior ED visit. Will begin insulin drip and reassess in 4 hours.  [CS]  0530 Glucose is improving. Continue to monitor.  [CS]  0653 CBG  is <250, will switch to dextrose fluids per protocol.  [CS]  Oct 13, 2021 Patient will be signed out to the oncoming team at shift change pending repeat BMP to ensure gap is closed.  [CS]    Clinical Course User Index [CS] 1308, MD   Medical Decision Making Amount and/or Complexity of Data Reviewed Labs: ordered.  Risk Prescription drug management.   Review of metabolic panel shows anion gap has corrected.  Blood sugars down to 220s.  Patient is alert.  He reports he has been able to sip on fluids all night long.  He has not had any further vomiting.  He reports feeling much improved.  Denies significant oral pain and has pain medications at home as needed.  Patient reports that he will be able to take his NovoLog per usual this morning and then eat liquid food at home.  He has a  prescription for Zofran.  At this time stable.  We reviewed plan of continuing his insulin at home and return precautions.       Pollyann Savoy, MD 10/11/21 [phone]

## 2021-10-11 NOTE — ED Provider Notes (Signed)
[facility] EMERGENCY DEPARTMENT  Provider Note  CSN: [id number] Arrival date & time: 10/11/21 0216  History Chief Complaint  Patient presents with   Abdominal Pain    Logan Washington is a 21 y.o. male brought by EMS for his third visit in 24hours. He was seen yesterday morning for bleeding after multiple dental extractions with a reported 500cc blood loss. Sent from here to his surgeon's office where the bleeding was stopped. Returned later in the evening for hematemesis and, while his Hgb had a slight drop, was no longer bleeding and not in need of transfusion. He was noted to be hyperglycemic with a mild anion gap. He is a known diabetic, reports he took Lantus at home but did not take any Humalog due to not eating. He has continued to have abdominal pain and vomiting, although not a bloody now.    Home Medications Prior to Admission medications   Medication Sig Start Date End Date Taking? Authorizing Provider  B-D UF III MINI PEN NEEDLES 31G X 5 MM MISC FOR USE WITH INSULIN PENS 10/12/15   [provider]  dicyclomine (BENTYL) 20 MG tablet Take 1 tablet (20 mg total) by mouth 4 (four) times daily -  before meals and at bedtime. Patient not taking: Reported on 03/15/2021 11/17/19   Arnaldo Natal, NP  LANTUS SOLOSTAR 100 UNIT/ML Solostar Pen Inject 25 Units into the skin at bedtime. Patient taking differently: Inject 31 Units into the skin at bedtime. 01/25/18   Osvaldo Shipper, MD  NOVOLOG FLEXPEN 100 UNIT/ML FlexPen Inject 6-7 Units into the skin 3 (three) times daily with meals. Every 7 carbs =1 unit 10/09/15   [provider]  ondansetron (ZOFRAN) 4 MG tablet Take 1 tablet (4 mg total) by mouth every 6 (six) hours. 10/10/21   Cristopher Peru, PA-C  pantoprazole (PROTONIX) 40 MG tablet Take 1 tablet (40 mg total) by mouth daily. 03/20/21   Rodolph Bong, MD  sucralfate (CARAFATE) 1 GM/10ML suspension Take 10 mLs (1 g total) by mouth every 6 (six)  hours for 7 days. 03/19/21 03/26/21  Rodolph Bong, MD     Allergies    Tilapia [fish allergy]   Review of Systems   Review of Systems Please see HPI for pertinent positives and negatives  Physical Exam BP (!) 148/92 (BP Location: Right Arm)   Pulse (!) 101   Temp 98.3 F (36.8 C) (Oral)   Resp 14   Ht 5\' 9"  (1.753 m)   Wt 68 kg   SpO2 100%   BMI 22.14 kg/m   Physical Exam Vitals and nursing note reviewed.  HENT:     Head: Normocephalic.     Nose: Nose normal.  Eyes:     Extraocular Movements: Extraocular movements intact.  Cardiovascular:     Rate and Rhythm: Normal rate.  Pulmonary:     Effort: Pulmonary effort is normal.  Abdominal:     Tenderness: There is generalized abdominal tenderness.  Musculoskeletal:        General: Normal range of motion.     Cervical back: Neck supple.  Skin:    Findings: No rash (on exposed skin).  Neurological:     Mental Status: He is alert and oriented to person, place, and time.  Psychiatric:        Mood and Affect: Mood normal.     ED Results / Procedures / Treatments   EKG None  Procedures Procedures  Medications Ordered in the  ED Medications  insulin regular, human (MYXREDLIN) 100 units/ 100 mL infusion (9.5 Units/hr Intravenous New Bag/Given 10/11/21 0423)  lactated ringers infusion ( Intravenous New Bag/Given 10/11/21 0544)  dextrose 5 % in lactated ringers infusion (has no administration in time range)  dextrose 50 % solution 0-50 mL (has no administration in time range)  ondansetron (ZOFRAN) injection 4 mg (4 mg Intravenous Given 10/11/21 0240)  lactated ringers bolus 1,000 mL ( Intravenous Stopped 10/11/21 0345)  lactated ringers bolus 1,360 mL (0 mLs Intravenous Stopped 10/11/21 0544)  potassium chloride 10 mEq in 100 mL IVPB (0 mEq Intravenous Stopped 10/11/21 0627)    Initial Impression and Plan  Patient here with continued abdominal discomfort and nausea/vomiting. Possible due to prior swallowed blood, but  will need to check labs to ensure no signs of developing DKA. IVF and Zofran ordered for comfort.   ED Course   Clinical Course as of 10/11/21 0655  Thu Oct 11, 2021  0247 VBG with mild acidosis.  [CS]  0250 CBC without significant change from previous.  [CS]  0355 BMP shows a more elevated anion gap and lower bicarb from prior ED visit. Will begin insulin drip and reassess in 4 hours.  [CS]  0530 Glucose is improving. Continue to monitor.  [CS]  0653 CBG  is <250, will switch to dextrose fluids per protocol.  [CS]  8657 Patient will be signed out to the oncoming team at shift change pending repeat BMP to ensure gap is closed.  [CS]    Clinical Course User Index [CS] Pollyann Savoy, MD     MDM Rules/Calculators/A&P Medical Decision Making Problems Addressed: Diabetic ketoacidosis without coma associated with type 1 diabetes mellitus [facility]): acute illness or injury Hematemesis with nausea: acute illness or injury  Amount and/or Complexity of Data Reviewed Labs: ordered. Decision-making details documented in ED Course.  Risk Prescription drug management.    Final Clinical Impression(s) / ED Diagnoses Final diagnoses:  Diabetic ketoacidosis without coma associated with type 1 diabetes mellitus (H[facility])  Hematemesis with nausea    Rx / DC Orders ED Discharge Orders     None        Pollyann Savoy, MD 10/11/21 [phone]

## 2021-10-11 NOTE — Discharge Instructions (Signed)
1.  Take your NovoLog when you get home as usual.  Eat 6 very soft pured foods.  Continue to hydrate. 2.  Fill your prescription for Zofran and take as needed. 3.  Return to the emergency department if you are feeling worse, have vomiting and cannot stay hydrated, your blood sugars are not controlled at home or other concerning symptoms.

## 2021-10-11 NOTE — ED Notes (Signed)
BMP collected and transported to lab

## 2021-10-11 NOTE — ED Triage Notes (Signed)
Patient arrived via EMS c/o dental pain. Patient seen here twice previously today for same. Patient states 10/10 pain. Patient is AO x 4, VS WDL, normal gait.

## 2022-04-18 ENCOUNTER — Emergency Department (HOSPITAL_BASED_OUTPATIENT_CLINIC_OR_DEPARTMENT_OTHER): Payer: Medicaid Other

## 2022-04-18 ENCOUNTER — Encounter (HOSPITAL_BASED_OUTPATIENT_CLINIC_OR_DEPARTMENT_OTHER): Payer: Self-pay | Admitting: Pediatrics

## 2022-04-18 ENCOUNTER — Emergency Department (HOSPITAL_BASED_OUTPATIENT_CLINIC_OR_DEPARTMENT_OTHER)
Admission: EM | Admit: 2022-04-18 | Discharge: 2022-04-18 | Disposition: A | Discharge status: Home / Self Care | Payer: Medicaid Other | Attending: Emergency Medicine | Admitting: Emergency Medicine

## 2022-04-18 ENCOUNTER — Ambulatory Visit
Admission: EM | Admit: 2022-04-18 | Discharge: 2022-04-18 | Disposition: A | Discharge status: Home / Self Care | Payer: Medicaid Other | Attending: Urgent Care | Admitting: Urgent Care

## 2022-04-18 ENCOUNTER — Other Ambulatory Visit: Payer: Self-pay

## 2022-04-18 DIAGNOSIS — R1084 Generalized abdominal pain: Secondary | ICD-10-CM | POA: Diagnosis not present

## 2022-04-18 DIAGNOSIS — Z794 Long term (current) use of insulin: Secondary | ICD-10-CM | POA: Diagnosis not present

## 2022-04-18 DIAGNOSIS — R112 Nausea with vomiting, unspecified: Secondary | ICD-10-CM | POA: Insufficient documentation

## 2022-04-18 DIAGNOSIS — E1065 Type 1 diabetes mellitus with hyperglycemia: Secondary | ICD-10-CM | POA: Insufficient documentation

## 2022-04-18 DIAGNOSIS — R1031 Right lower quadrant pain: Secondary | ICD-10-CM | POA: Diagnosis present

## 2022-04-18 DIAGNOSIS — E109 Type 1 diabetes mellitus without complications: Secondary | ICD-10-CM

## 2022-04-18 DIAGNOSIS — D72829 Elevated white blood cell count, unspecified: Secondary | ICD-10-CM | POA: Insufficient documentation

## 2022-04-18 DIAGNOSIS — Z79899 Other long term (current) drug therapy: Secondary | ICD-10-CM | POA: Diagnosis not present

## 2022-04-18 DIAGNOSIS — Z1152 Encounter for screening for COVID-19: Secondary | ICD-10-CM | POA: Diagnosis not present

## 2022-04-18 LAB — BASIC METABOLIC PANEL
Anion gap: 16 — ABNORMAL HIGH (ref 5–15)
Anion gap: 12 (ref 5–15)
BUN: 16 mg/dL (ref 6–20)
BUN: 15 mg/dL (ref 6–20)
CO2: 24 mmol/L (ref 22–32)
CO2: 23 mmol/L (ref 22–32)
Calcium: 10.4 mg/dL — ABNORMAL HIGH (ref 8.9–10.3)
Calcium: 8.8 mg/dL — ABNORMAL LOW (ref 8.9–10.3)
Chloride: 97 mmol/L — ABNORMAL LOW (ref 98–111)
Chloride: 98 mmol/L (ref 98–111)
Creatinine, Ser: 1.03 mg/dL (ref 0.61–1.24)
Creatinine, Ser: 1 mg/dL (ref 0.61–1.24)
GFR, Estimated: >60 mL/min (ref >60)
GFR, Estimated: >60 mL/min (ref >60)
Glucose, Bld: 281 mg/dL — ABNORMAL HIGH (ref 70–99)
Glucose, Bld: 278 mg/dL — ABNORMAL HIGH (ref 70–99)
Potassium: 4.5 mmol/L (ref 3.5–5.1)
Potassium: 3.8 mmol/L (ref 3.5–5.1)
Sodium: 137 mmol/L (ref 135–145)
Sodium: 133 mmol/L — ABNORMAL LOW (ref 135–145)

## 2022-04-18 LAB — CBC
HCT: 46.4 % (ref 39.0–52.0)
Hemoglobin: 16.3 g/dL (ref 13.0–17.0)
MCH: 28.8 pg (ref 26.0–34.0)
MCHC: 35.1 g/dL (ref 30.0–36.0)
MCV: 82.1 fL (ref 80.0–100.0)
Platelets: 358 10*3/uL (ref 150–400)
RBC: 5.65 MIL/uL (ref 4.22–5.81)
RDW: 12.6 % (ref 11.5–15.5)
WBC: 17.4 10*3/uL — ABNORMAL HIGH (ref 4.0–10.5)
nRBC: 0 % (ref 0.0–0.2)

## 2022-04-18 LAB — RAPID URINE DRUG SCREEN, HOSP PERFORMED
Amphetamines: NOT DETECTED
Barbiturates: NOT DETECTED
Benzodiazepines: NOT DETECTED
Cocaine: NOT DETECTED
Opiates: POSITIVE — AB
Tetrahydrocannabinol: POSITIVE — AB

## 2022-04-18 LAB — URINALYSIS, MICROSCOPIC (REFLEX)

## 2022-04-18 LAB — RESP PANEL BY RT-PCR (RSV, FLU A&B, COVID)  RVPGX2
Influenza A by PCR: NEGATIVE
Influenza B by PCR: NEGATIVE
Resp Syncytial Virus by PCR: NEGATIVE
SARS Coronavirus 2 by RT PCR: NEGATIVE

## 2022-04-18 LAB — URINALYSIS, ROUTINE W REFLEX MICROSCOPIC
Bilirubin Urine: NEGATIVE
Glucose, UA: >=500 mg/dL — AB
Hgb urine dipstick: NEGATIVE
Ketones, ur: >=80 mg/dL — AB
Leukocytes,Ua: NEGATIVE
Nitrite: NEGATIVE
Protein, ur: NEGATIVE mg/dL
Specific Gravity, Urine: 1.015 (ref 1.005–1.030)
pH: 7 (ref 5.0–8.0)

## 2022-04-18 LAB — POCT FASTING CBG KUC MANUAL ENTRY: POCT Glucose (KUC): 248 mg/dL — AB (ref 70–99)

## 2022-04-18 LAB — CBG MONITORING, ED
Glucose-Capillary: 277 mg/dL — ABNORMAL HIGH (ref 70–99)
Glucose-Capillary: 275 mg/dL — ABNORMAL HIGH (ref 70–99)

## 2022-04-18 MED ORDER — HALOPERIDOL LACTATE 5 MG/ML IJ SOLN
5.0000 mg | Freq: Once | INTRAMUSCULAR | Status: AC
  Administered 2022-04-18: 5 mg via INTRAVENOUS
  Filled 2022-04-18: qty 1

## 2022-04-18 MED ORDER — ONDANSETRON HCL 4 MG/2ML IJ SOLN
4.0000 mg | Freq: Once | INTRAMUSCULAR | Status: AC
  Administered 2022-04-18: 4 mg via INTRAVENOUS
  Filled 2022-04-18: qty 2

## 2022-04-18 MED ORDER — MORPHINE SULFATE (PF) 4 MG/ML IV SOLN
6.0000 mg | Freq: Once | INTRAVENOUS | Status: AC
  Administered 2022-04-18: 6 mg via INTRAVENOUS
  Filled 2022-04-18: qty 2

## 2022-04-18 MED ORDER — IOHEXOL 300 MG/ML  SOLN
100.0000 mL | Freq: Once | INTRAMUSCULAR | Status: AC | PRN
  Administered 2022-04-18: 100 mL via INTRAVENOUS

## 2022-04-18 MED ORDER — ONDANSETRON HCL 4 MG/2ML IJ SOLN
4.0000 mg | Freq: Once | INTRAMUSCULAR | Status: AC
  Administered 2022-04-18: 4 mg via INTRAMUSCULAR

## 2022-04-18 MED ORDER — LACTATED RINGERS IV BOLUS
2000.0000 mL | Freq: Once | INTRAVENOUS | Status: AC
  Administered 2022-04-18: 2000 mL via INTRAVENOUS

## 2022-04-18 MED ORDER — ONDANSETRON HCL 4 MG PO TABS: 4.0000 mg | ORAL_TABLET | Freq: Four times a day (QID) | ORAL | 0 refills | Status: DC

## 2022-04-18 MED ORDER — ONDANSETRON 4 MG PO TBDP
4.0000 mg | ORAL_TABLET | Freq: Once | ORAL | Status: AC
  Administered 2022-04-18: 4 mg via ORAL
  Filled 2022-04-18: qty 1

## 2022-04-18 NOTE — ED Notes (Signed)
Patient is being discharged from the Urgent Care and sent to the Emergency Department via POV . Per [place], [place], patient is in need of higher level of care due to N/V, hyperglycemia. Patient is aware and verbalizes understanding of plan of care.  Vitals:   04/18/22 0943  BP: 137/83  Pulse: 90  Resp: (!) 24  Temp: 98.5 F (36.9 C)  SpO2: 98%

## 2022-04-18 NOTE — ED Triage Notes (Addendum)
Pt c/o generalized abd pain, n/v/d started last night-NAD/pale-slow gait

## 2022-04-18 NOTE — ED Notes (Signed)
Pt and partner verbally understood discharge instructions

## 2022-04-18 NOTE — ED Provider Notes (Signed)
  Physical Exam  BP 133/84   Pulse 97   Temp 97.8 F (36.6 C) (Oral)   Resp 11   Ht '5\' 10"'$  (1.778 m)   Wt 81.6 kg   SpO2 92%   BMI 25.83 kg/m   Physical Exam  Procedures  Procedures  ED Course / MDM    Medical Decision Making Care assumed at 4 PM.  Patient has history of diabetes and patient was not in DKA.  Patient is here with abdominal pain and vomiting.  CT abdomen pelvis was unremarkable.  Signed out pending p.o. trial and urinalysis  4:25 PM UA showed ketones but no infection.  UDS positive for opiates and marijuana.  Patient always has marijuana in his system.  I wonder if there is a component of hyperemesis.  Patient is tolerating p.o. in the ED.  Stable for discharge.  Problems Addressed: Nausea and vomiting, unspecified vomiting type: acute illness or injury Right lower quadrant abdominal pain: acute illness or injury Type 1 diabetes mellitus without complication ([place]): acute illness or injury  Amount and/or Complexity of Data Reviewed Labs: ordered. Decision-making details documented in ED Course. Radiology: ordered and independent interpretation performed. Decision-making details documented in ED Course.  Risk Prescription drug management.          Drenda Freeze, MD 04/18/22 [phone]

## 2022-04-18 NOTE — ED Notes (Signed)
Unable to obtain urine in UC

## 2022-04-18 NOTE — ED Provider Notes (Signed)
[facility] Provider Note   CSN: [id number] Arrival date & time: 04/18/22  1030     History  Chief Complaint  Patient presents with   Hyperglycemia    Logan Washington is a 22 y.o. male.  HPI     22 year old male comes into the emergency room with chief complaint of vomiting, abdominal pain. Patient has history of type 1 diabetes.  He is accompanied by his girlfriend who provides meaningful history.  The girlfriend states that patient was doing well throughout the day, however around midnight he started feeling nauseous and has had multiple emesis since then.  Patient thinks he has had 10+ episodes of emesis, they are now bilious.  He has generalized abdominal pain associated with it, worse on the right side.  No history of similar symptoms in the past.  No sick contacts.  Patient denies any fevers, chills.  He also denies any UTI-like symptoms, previous history of abdominal surgeries.  Patient has been taking his medications as prescribed.  His blood sugar was high today.  He went to urgent care, they were advised that patient come to the emergency room.  Home Medications Prior to Admission medications   Medication Sig Start Date End Date Taking? Authorizing Provider  B-D UF III MINI PEN NEEDLES 31G X 5 MM MISC FOR USE WITH INSULIN PENS 10/12/15   [provider]  dicyclomine (BENTYL) 20 MG tablet Take 1 tablet (20 mg total) by mouth 4 (four) times daily -  before meals and at bedtime. Patient not taking: Reported on 03/15/2021 11/17/19   Noralyn Pick, NP  LANTUS SOLOSTAR 100 UNIT/ML Solostar Pen Inject 25 Units into the skin at bedtime. Patient taking differently: Inject 31 Units into the skin at bedtime. 01/25/18   Bonnielee Haff, MD  NOVOLOG FLEXPEN 100 UNIT/ML FlexPen Inject 6-7 Units into the skin 3 (three) times daily with meals. Every 7 carbs =1 unit 10/09/15   [provider]  ondansetron (ZOFRAN) 4 MG  tablet Take 1 tablet (4 mg total) by mouth every 6 (six) hours. 04/18/22   Drenda Freeze, MD  pantoprazole (PROTONIX) 40 MG tablet Take 1 tablet (40 mg total) by mouth daily. 03/20/21   Eugenie Filler, MD  sucralfate (CARAFATE) 1 GM/10ML suspension Take 10 mLs (1 g total) by mouth every 6 (six) hours for 7 days. 03/19/21 03/26/21  Eugenie Filler, MD      Allergies    Tilapia [fish allergy]    Review of Systems   Review of Systems  All other systems reviewed and are negative.   Physical Exam Updated Vital Signs BP 115/68   Pulse (!) 102   Temp 98.9 F (37.2 C) (Oral)   Resp 18   Ht 5\' 10"  (1.778 m)   Wt 81.6 kg   SpO2 99%   BMI 25.83 kg/m  Physical Exam Vitals and nursing note reviewed.  Constitutional:      Appearance: He is well-developed.  HENT:     Head: Atraumatic.  Eyes:     Extraocular Movements: Extraocular movements intact.     Pupils: Pupils are equal, round, and reactive to light.  Cardiovascular:     Rate and Rhythm: Normal rate.  Pulmonary:     Effort: Pulmonary effort is normal.  Abdominal:     Tenderness: There is abdominal tenderness.     Comments: Patient has tenderness over the worse right side of the abdomen, particular in the right lower  quadrant with there is guarding.  Abdominal tenderness is diffuse.  Musculoskeletal:     Cervical back: Neck supple.  Skin:    General: Skin is warm.  Neurological:     Mental Status: He is alert and oriented to person, place, and time.     ED Results / Procedures / Treatments   Labs (all labs ordered are listed, but only abnormal results are displayed) Labs Reviewed  BASIC METABOLIC PANEL - Abnormal; Notable for the following components:      Result Value   Chloride 97 (*)    Glucose, Bld 281 (*)    Calcium 10.4 (*)    Anion gap 16 (*)    All other components within normal limits  CBC - Abnormal; Notable for the following components:   WBC 17.4 (*)    All other components within normal limits   URINALYSIS, ROUTINE W REFLEX MICROSCOPIC - Abnormal; Notable for the following components:   Glucose, UA >=500 (*)    Ketones, ur >=80 (*)    All other components within normal limits  BASIC METABOLIC PANEL - Abnormal; Notable for the following components:   Sodium 133 (*)    Glucose, Bld 278 (*)    Calcium 8.8 (*)    All other components within normal limits  RAPID URINE DRUG SCREEN, HOSP PERFORMED - Abnormal; Notable for the following components:   Opiates POSITIVE (*)    Tetrahydrocannabinol POSITIVE (*)    All other components within normal limits  URINALYSIS, MICROSCOPIC (REFLEX) - Abnormal; Notable for the following components:   Bacteria, UA RARE (*)    All other components within normal limits  CBG MONITORING, ED - Abnormal; Notable for the following components:   Glucose-Capillary 277 (*)    All other components within normal limits  CBG MONITORING, ED - Abnormal; Notable for the following components:   Glucose-Capillary 275 (*)    All other components within normal limits  RESP PANEL BY RT-PCR (RSV, FLU A&B, COVID)  RVPGX2  CBG MONITORING, ED    EKG None  Radiology CT ABDOMEN PELVIS W CONTRAST  Result Date: 04/18/2022 CLINICAL DATA:  Right lower quadrant abdominal pain EXAM: CT ABDOMEN AND PELVIS WITH CONTRAST TECHNIQUE: Multidetector CT imaging of the abdomen and pelvis was performed using the standard protocol following bolus administration of intravenous contrast. RADIATION DOSE REDUCTION: This exam was performed according to the departmental dose-optimization program which includes automated exposure control, adjustment of the mA and/or kV according to patient size and/or use of iterative reconstruction technique. CONTRAST:  148mL OMNIPAQUE IOHEXOL 300 MG/ML  SOLN COMPARISON:  Prior CT abdomen/pelvis 09/26/2019 and 02/19/2021 FINDINGS: Lower chest: No acute abnormality. Hepatobiliary: No focal liver abnormality is seen. No gallstones, gallbladder wall thickening, or  biliary dilatation. Pancreas: Unremarkable. No pancreatic ductal dilatation or surrounding inflammatory changes. Spleen: Normal in size without focal abnormality. Adrenals/Urinary Tract: Adrenal glands are unremarkable. Kidneys are normal, without renal calculi, focal lesion, or hydronephrosis. Bladder is unremarkable. Stomach/Bowel: Stomach is within normal limits. Appendix appears normal. No evidence of bowel wall thickening, distention, or inflammatory changes. Vascular/Lymphatic: No significant vascular findings are present. No enlarged abdominal or pelvic lymph nodes. Reproductive: Uterus and bilateral adnexa are unremarkable. Other: No abdominal wall hernia or abnormality. No abdominopelvic ascites. Musculoskeletal: No acute or significant osseous findings. IMPRESSION: Normal CT scan of the abdomen and pelvis. Specifically, the appendix is normal. Electronically Signed   By: Jacqulynn Cadet M.D.   On: 04/18/2022 12:21    Procedures Procedures  Medications Ordered in ED Medications  lactated ringers bolus 2,000 mL (0 mLs Intravenous Stopped 04/18/22 1425)  ondansetron (ZOFRAN) injection 4 mg (4 mg Intravenous Given 04/18/22 1151)  morphine (PF) 4 MG/ML injection 6 mg (6 mg Intravenous Given 04/18/22 1154)  iohexol (OMNIPAQUE) 300 MG/ML solution 100 mL (100 mLs Intravenous Contrast Given 04/18/22 1209)  ondansetron (ZOFRAN-ODT) disintegrating tablet 4 mg (4 mg Oral Given 04/18/22 1648)  haloperidol lactate (HALDOL) injection 5 mg (5 mg Intravenous Given 04/18/22 1413)    ED Course/ Medical Decision Making/ A&P                             Medical Decision Making Problems Addressed: Nausea and vomiting, unspecified vomiting type: acute illness or injury  Amount and/or Complexity of Data Reviewed Labs: ordered. Radiology: ordered.  Risk Prescription drug management.   22 year old patient with history of type 1 diabetes comes in with chief complaint of nausea, vomiting and abdominal pain.  He  also feels weak.  History provided by patient's girlfriend.  She states that he was fine throughout the day yesterday until overnight when he started having vomiting.    Differential diagnosis considered for this patient include UTI, viral syndrome including COVID-19 or flu, gastroenteritis, dehydration, DKA, appendicitis, cholelithiasis, gastroparesis.  Given the significant abdominal tenderness, and elevation of white count we will proceed with CT abdomen and pelvis with contrast. Patient reassessed, he is noted to have hyperglycemia with mild anion gap.  Bicarb is normal.  At this time it does not appear that he is in DKA, but the mild anion gap is a result of dehydration.  IV fluids has been ordered.  CT scan has been ordered.  @2 :15 pm I have independently interpreted patient's CT scan.  There is no evidence of perforation.  CT scan is also negative for acute appendicitis. P.o. challenge initiated.  Patient continues to feel nauseous however he has not had any emesis since IV Zofran.  Will give him Haldol.  Repeat BMP also ordered.  Final Clinical Impression(s) / ED Diagnoses Final diagnoses:  Right lower quadrant abdominal pain  Type 1 diabetes mellitus without complication (HCC)  Nausea and vomiting, unspecified vomiting type    Rx / DC Orders ED Discharge Orders          Ordered    ondansetron (ZOFRAN) 4 MG tablet  Every 6 hours        04/18/22 1627              Varney Biles, MD 04/19/22 [id number]

## 2022-04-18 NOTE — Discharge Instructions (Signed)
Please report to the emergency room now as you are in need of a higher level of care than we can provide in the urgent care setting.  You will likely need testing to rule out an acute abdomen, an acute kidney injury, diabetic ketoacidosis, cannabinoid induced hyperemesis syndrome.  This includes testing such as point-of-care blood work, imaging.  He will be evaluated by a different team through the emergency room and testing will be ordered as necessary.  Please head to the ER now.

## 2022-04-18 NOTE — ED Provider Notes (Addendum)
[facility]  Note:  This document was prepared using Systems analyst and may include unintentional dictation errors.  MRN: [id number] DOB: 2000/08/18  Subjective:   Logan Washington is a 22 y.o. male presenting for 1 day history of acute onset nausea with vomiting, belly pain, shob. Has had numerous vomiting episodes, lost count.  No hematemesis.  Has not had diarrhea, constipation. Has a history of Type 1 diabetes, history of DKA, AKI last episodes were last year. Reports that he smokes marijuana some times. No alcohol use. Has a history of gastritis.   No current facility-administered medications for this encounter.  Current Outpatient Medications:    B-D UF III MINI PEN NEEDLES 31G X 5 MM MISC, FOR USE WITH INSULIN PENS, Disp: , Rfl: 6   dicyclomine (BENTYL) 20 MG tablet, Take 1 tablet (20 mg total) by mouth 4 (four) times daily -  before meals and at bedtime. (Patient not taking: Reported on 03/15/2021), Disp: 20 tablet, Rfl: 0   LANTUS SOLOSTAR 100 UNIT/ML Solostar Pen, Inject 25 Units into the skin at bedtime. (Patient taking differently: Inject 31 Units into the skin at bedtime.), Disp: 15 mL, Rfl: 11   NOVOLOG FLEXPEN 100 UNIT/ML FlexPen, Inject 6-7 Units into the skin 3 (three) times daily with meals. Every 7 carbs =1 unit, Disp: , Rfl: 11   ondansetron (ZOFRAN) 4 MG tablet, Take 1 tablet (4 mg total) by mouth every 6 (six) hours., Disp: 12 tablet, Rfl: 0   pantoprazole (PROTONIX) 40 MG tablet, Take 1 tablet (40 mg total) by mouth daily., Disp: 30 tablet, Rfl: 2   sucralfate (CARAFATE) 1 GM/10ML suspension, Take 10 mLs (1 g total) by mouth every 6 (six) hours for 7 days., Disp: 280 mL, Rfl: 1   Allergies  Allergen Reactions   Tilapia [Fish Allergy] Swelling    Mouth     Past Medical History:  Diagnosis Date   Diabetes ([place])    Diabetes mellitus without complication ([place])      Past Surgical History:  Procedure Laterality Date    ESOPHAGOGASTRODUODENOSCOPY (EGD) WITH PROPOFOL N/A 03/18/2021   Procedure: ESOPHAGOGASTRODUODENOSCOPY (EGD) WITH PROPOFOL;  Surgeon: Clarene Essex, MD;  Location: [facility];  Service: Endoscopy;  Laterality: N/A;   TONSILLECTOMY      Family History  Problem Relation Age of Onset   Breast cancer Mother    Lung cancer Mother    Colon cancer Maternal Grandmother    Sudden death Neg Hx    Heart attack Neg Hx    Stomach cancer Neg Hx    Pancreatic cancer Neg Hx    Esophageal cancer Neg Hx    Rectal cancer Neg Hx     Social History   Tobacco Use   Smoking status: Never   Smokeless tobacco: Never  Vaping Use   Vaping Use: Never used  Substance Use Topics   Alcohol use: Never   Drug use: Yes    Types: Marijuana    ROS   Objective:   Vitals: BP 137/83 (BP Location: Right Arm)   Pulse 90   Resp (!) 24   SpO2 98%   Physical Exam Constitutional:      General: He is not in acute distress.    Appearance: Normal appearance. He is well-developed. He is ill-appearing and diaphoretic. He is not toxic-appearing.  HENT:     Head: Normocephalic and atraumatic.     Right Ear: External ear normal.     Left Ear:  External ear normal.     Nose: Nose normal.     Mouth/Throat:     Mouth: Mucous membranes are moist.  Eyes:     General: No scleral icterus.       Right eye: No discharge.        Left eye: No discharge.     Extraocular Movements: Extraocular movements intact.  Cardiovascular:     Rate and Rhythm: Normal rate and regular rhythm.     Heart sounds: Normal heart sounds. No murmur heard.    No friction rub. No gallop.  Pulmonary:     Effort: Pulmonary effort is normal. No respiratory distress.     Breath sounds: Normal breath sounds. No stridor. No wheezing, rhonchi or rales.  Abdominal:     General: Bowel sounds are normal.     Comments: Patient politely refused abdominal exam secondary to his pain.  Persistent vomiting in clinic.   Neurological:     Mental Status: He  is alert and oriented to person, place, and time.  Psychiatric:        Mood and Affect: Mood normal.        Behavior: Behavior normal.        Thought Content: Thought content normal.     Results for orders placed or performed during the hospital encounter of 04/18/22 (from the past 24 hour(s))  POCT CBG (manual entry)     Status: Abnormal   Collection Time: 04/18/22  9:48 AM  Result Value Ref Range   POCT Glucose ([facility]) 248 (A) 70 - 99 mg/dL   IM Zofran 4 mg in clinic.   Assessment and Plan :   PDMP not reviewed this encounter.  1. Intractable vomiting with nausea   2. Hyperglycemia due to type 1 diabetes mellitus ([place])     Unable to obtain a temperature, patient persistently gagging and unable to tolerate a temperature check. Patient has intractable vomiting with nausea in clinic.  Has severe abdominal pain to the point that he refused abdominal exam.  Also reported that he was not able to provide a urine sample.  IM Zofran was given as above.  Patient is in need of a higher level of care than we can provide in the urgent care setting.  Differential includes acute abdomen, acute kidney injury, diabetic ketoacidosis, cannabinoid induced hyperemesis syndrome, recurrent gastritis.  Discussed this with patient and his girlfriend, they are agreeable to presenting to the emergency room now.  Patient is hemodynamically stable, can present by personal vehicle.     Jaynee Eagles, [place] 04/18/22 [id number]

## 2022-04-18 NOTE — Discharge Instructions (Signed)
As we discussed, your labs showed mildly elevated blood sugar and your CT scan was unremarkable  Please stay hydrated. Take zofran for nausea   See your doctor for follow up   Return to ER if you have worse abdominal pain, vomiting, fever

## 2022-04-18 NOTE — ED Triage Notes (Signed)
Girlfriend at bedside reports was seen at [facility] earlier for vomiting and was given IM zofran. Reports continues to vomit and now has shortness of breath. Reports DM I with insulin coverage daily.

## 2022-04-21 DIAGNOSIS — G629 Polyneuropathy, unspecified: Secondary | ICD-10-CM | POA: Insufficient documentation

## 2022-05-26 DIAGNOSIS — R569 Unspecified convulsions: Secondary | ICD-10-CM | POA: Insufficient documentation

## 2023-04-07 ENCOUNTER — Observation Stay (HOSPITAL_BASED_OUTPATIENT_CLINIC_OR_DEPARTMENT_OTHER)
Admission: EM | Admit: 2023-04-07 | Discharge: 2023-04-08 | Disposition: A | Discharge status: Home / Self Care | Payer: Medicaid Other | Attending: Surgery | Admitting: Surgery

## 2023-04-07 ENCOUNTER — Emergency Department (HOSPITAL_BASED_OUTPATIENT_CLINIC_OR_DEPARTMENT_OTHER): Payer: Medicaid Other

## 2023-04-07 ENCOUNTER — Other Ambulatory Visit: Payer: Self-pay

## 2023-04-07 ENCOUNTER — Encounter (HOSPITAL_BASED_OUTPATIENT_CLINIC_OR_DEPARTMENT_OTHER): Payer: Self-pay | Admitting: Emergency Medicine

## 2023-04-07 DIAGNOSIS — K37 Unspecified appendicitis: Secondary | ICD-10-CM | POA: Diagnosis present

## 2023-04-07 DIAGNOSIS — U071 COVID-19: Secondary | ICD-10-CM | POA: Diagnosis not present

## 2023-04-07 DIAGNOSIS — E119 Type 2 diabetes mellitus without complications: Secondary | ICD-10-CM

## 2023-04-07 DIAGNOSIS — E109 Type 1 diabetes mellitus without complications: Secondary | ICD-10-CM | POA: Insufficient documentation

## 2023-04-07 DIAGNOSIS — K3580 Unspecified acute appendicitis: Secondary | ICD-10-CM | POA: Diagnosis not present

## 2023-04-07 DIAGNOSIS — K2289 Other specified disease of esophagus: Secondary | ICD-10-CM | POA: Diagnosis present

## 2023-04-07 DIAGNOSIS — Z79899 Other long term (current) drug therapy: Secondary | ICD-10-CM | POA: Diagnosis not present

## 2023-04-07 DIAGNOSIS — R112 Nausea with vomiting, unspecified: Secondary | ICD-10-CM | POA: Diagnosis present

## 2023-04-07 DIAGNOSIS — R1110 Vomiting, unspecified: Secondary | ICD-10-CM

## 2023-04-07 HISTORY — DX: Attention-deficit hyperactivity disorder, unspecified type: F90.9

## 2023-04-07 LAB — RESP PANEL BY RT-PCR (RSV, FLU A&B, COVID)  RVPGX2
Influenza A by PCR: NEGATIVE
Influenza B by PCR: NEGATIVE
Resp Syncytial Virus by PCR: NEGATIVE
SARS Coronavirus 2 by RT PCR: POSITIVE — AB

## 2023-04-07 LAB — COMPREHENSIVE METABOLIC PANEL
ALT: 14 U/L (ref 0–44)
AST: 22 U/L (ref 15–41)
Albumin: 4.2 g/dL (ref 3.5–5.0)
Alkaline Phosphatase: 66 U/L (ref 38–126)
Anion gap: 13 (ref 5–15)
BUN: 13 mg/dL (ref 6–20)
CO2: 23 mmol/L (ref 22–32)
Calcium: 9.2 mg/dL (ref 8.9–10.3)
Chloride: 100 mmol/L (ref 98–111)
Creatinine, Ser: 0.9 mg/dL (ref 0.61–1.24)
GFR, Estimated: >60 mL/min (ref >60)
Glucose, Bld: 274 mg/dL — ABNORMAL HIGH (ref 70–99)
Potassium: 4.9 mmol/L (ref 3.5–5.1)
Sodium: 136 mmol/L (ref 135–145)
Total Bilirubin: 1.5 mg/dL — ABNORMAL HIGH (ref 0.0–1.2)
Total Protein: 7.6 g/dL (ref 6.5–8.1)

## 2023-04-07 LAB — CBC
HCT: 45.7 % (ref 39.0–52.0)
Hemoglobin: 15.6 g/dL (ref 13.0–17.0)
MCH: 28.8 pg (ref 26.0–34.0)
MCHC: 34.1 g/dL (ref 30.0–36.0)
MCV: 84.5 fL (ref 80.0–100.0)
Platelets: 342 10*3/uL (ref 150–400)
RBC: 5.41 MIL/uL (ref 4.22–5.81)
RDW: 12.6 % (ref 11.5–15.5)
WBC: 11.2 10*3/uL — ABNORMAL HIGH (ref 4.0–10.5)
nRBC: 0 % (ref 0.0–0.2)

## 2023-04-07 LAB — LIPASE, BLOOD: Lipase: 22 U/L (ref 11–51)

## 2023-04-07 LAB — URINALYSIS, ROUTINE W REFLEX MICROSCOPIC
Bilirubin Urine: NEGATIVE
Glucose, UA: >=500 mg/dL — AB
Hgb urine dipstick: NEGATIVE
Ketones, ur: 80 mg/dL — AB
Leukocytes,Ua: NEGATIVE
Nitrite: NEGATIVE
Protein, ur: NEGATIVE mg/dL
Specific Gravity, Urine: 1.015 (ref 1.005–1.030)
pH: 6.5 (ref 5.0–8.0)

## 2023-04-07 LAB — URINALYSIS, MICROSCOPIC (REFLEX)
RBC / HPF: NONE SEEN RBC/hpf (ref 0–5)
WBC, UA: NONE SEEN WBC/hpf (ref 0–5)

## 2023-04-07 LAB — GLUCOSE, CAPILLARY: Glucose-Capillary: 205 mg/dL — ABNORMAL HIGH (ref 70–99)

## 2023-04-07 MED ORDER — SODIUM CHLORIDE 0.9 % IV SOLN
INTRAVENOUS | Status: DC
Start: 2023-04-07 — End: 2023-04-08

## 2023-04-07 MED ORDER — HALOPERIDOL LACTATE 5 MG/ML IJ SOLN
2.0000 mg | Freq: Once | INTRAMUSCULAR | Status: AC
  Administered 2023-04-07: 2 mg via INTRAVENOUS
  Filled 2023-04-07: qty 1

## 2023-04-07 MED ORDER — ACETAMINOPHEN 650 MG RE SUPP: 650.0000 mg | Freq: Four times a day (QID) | RECTAL | Status: DC | PRN

## 2023-04-07 MED ORDER — PANTOPRAZOLE SODIUM 40 MG IV SOLR
40.0000 mg | INTRAVENOUS | Status: DC
  Administered 2023-04-07: 40 mg via INTRAVENOUS
  Filled 2023-04-07: qty 10

## 2023-04-07 MED ORDER — SODIUM CHLORIDE 0.9 % IV SOLN
2.0000 g | Freq: Once | INTRAVENOUS | Status: AC
  Administered 2023-04-07: 2 g via INTRAVENOUS
  Filled 2023-04-07: qty 20

## 2023-04-07 MED ORDER — SODIUM CHLORIDE 0.9 % IV BOLUS
1000.0000 mL | Freq: Once | INTRAVENOUS | Status: AC
  Administered 2023-04-07: 1000 mL via INTRAVENOUS

## 2023-04-07 MED ORDER — HYDROMORPHONE HCL 1 MG/ML IJ SOLN: 0.5000 mg | INTRAMUSCULAR | Status: DC | PRN

## 2023-04-07 MED ORDER — SODIUM CHLORIDE 0.9 % IV SOLN: 12.5000 mg | Freq: Four times a day (QID) | INTRAVENOUS | Status: DC | PRN

## 2023-04-07 MED ORDER — SODIUM CHLORIDE 0.9 % IV SOLN: Freq: Once | INTRAVENOUS | Status: AC

## 2023-04-07 MED ORDER — LORAZEPAM 2 MG/ML IJ SOLN
1.0000 mg | Freq: Once | INTRAMUSCULAR | Status: AC
  Administered 2023-04-07: 1 mg via INTRAVENOUS
  Filled 2023-04-07: qty 1

## 2023-04-07 MED ORDER — ACETAMINOPHEN 325 MG PO TABS: 650.0000 mg | ORAL_TABLET | Freq: Four times a day (QID) | ORAL | Status: DC | PRN

## 2023-04-07 MED ORDER — ONDANSETRON HCL 4 MG PO TABS: 4.0000 mg | ORAL_TABLET | Freq: Four times a day (QID) | ORAL | Status: DC | PRN

## 2023-04-07 MED ORDER — ONDANSETRON HCL 4 MG/2ML IJ SOLN: 4.0000 mg | Freq: Four times a day (QID) | INTRAMUSCULAR | Status: DC | PRN

## 2023-04-07 MED ORDER — METRONIDAZOLE 500 MG/100ML IV SOLN
500.0000 mg | Freq: Once | INTRAVENOUS | Status: AC
  Administered 2023-04-07: 500 mg via INTRAVENOUS
  Filled 2023-04-07: qty 100

## 2023-04-07 MED ORDER — ONDANSETRON HCL 4 MG/2ML IJ SOLN
4.0000 mg | Freq: Once | INTRAMUSCULAR | Status: AC
  Administered 2023-04-07: 4 mg via INTRAVENOUS
  Filled 2023-04-07: qty 2

## 2023-04-07 MED ORDER — INSULIN ASPART 100 UNIT/ML IJ SOLN
0.0000 [IU] | INTRAMUSCULAR | Status: DC
  Administered 2023-04-07: 2 [IU] via SUBCUTANEOUS
  Administered 2023-04-08: 1 [IU] via SUBCUTANEOUS

## 2023-04-07 MED ORDER — IOHEXOL 300 MG/ML  SOLN
100.0000 mL | Freq: Once | INTRAMUSCULAR | Status: AC | PRN
  Administered 2023-04-07: 100 mL via INTRAVENOUS

## 2023-04-07 MED ORDER — OXYCODONE HCL 5 MG PO TABS: 5.0000 mg | ORAL_TABLET | ORAL | Status: DC | PRN

## 2023-04-07 MED ORDER — SODIUM CHLORIDE 0.9 % IV SOLN: 2.0000 g | INTRAVENOUS | Status: DC

## 2023-04-07 MED ORDER — METRONIDAZOLE 500 MG/100ML IV SOLN
500.0000 mg | Freq: Two times a day (BID) | INTRAVENOUS | Status: DC
  Administered 2023-04-08: 500 mg via INTRAVENOUS
  Filled 2023-04-07: qty 100

## 2023-04-07 MED ORDER — INSULIN GLARGINE 100 UNIT/ML ~~LOC~~ SOLN
15.0000 [IU] | Freq: Every day | SUBCUTANEOUS | Status: DC
  Administered 2023-04-07: 15 [IU] via SUBCUTANEOUS
  Filled 2023-04-07 (×2): qty 0.15

## 2023-04-07 NOTE — ED Provider Notes (Signed)
  EMERGENCY DEPARTMENT AT [facility] Provider Note   CSN: [id number] Arrival date & time: 04/07/23  1348     History  Chief Complaint  Patient presents with   Abdominal Pain    Logan Washington is a 23 y.o. male.  The history is provided by the patient.  Abdominal Pain Pain location:  Generalized Pain quality: cramping   Pain radiates to:  Does not radiate Pain severity:  Mild Onset quality:  Gradual Duration:  1 day Timing:  Constant Progression:  Unchanged Chronicity:  New Context comment:  Dm, THC use, no fever Relieved by:  Nothing Associated symptoms: nausea and vomiting   Associated symptoms: no anorexia, no belching, no chest pain, no chills, no constipation, no cough, no diarrhea, no dysuria, no fatigue, no fever, no flatus, no hematuria, no melena and no shortness of breath        Home Medications Prior to Admission medications   Medication Sig Start Date End Date Taking? Authorizing Provider  B-D UF III MINI PEN NEEDLES 31G X 5 MM MISC FOR USE WITH INSULIN PENS 10/12/15   [provider]  dicyclomine (BENTYL) 20 MG tablet Take 1 tablet (20 mg total) by mouth 4 (four) times daily -  before meals and at bedtime. Patient not taking: Reported on 03/15/2021 11/17/19   Arnaldo Natal, NP  LANTUS SOLOSTAR 100 UNIT/ML Solostar Pen Inject 25 Units into the skin at bedtime. Patient taking differently: Inject 31 Units into the skin at bedtime. 01/25/18   Osvaldo Shipper, MD  NOVOLOG FLEXPEN 100 UNIT/ML FlexPen Inject 6-7 Units into the skin 3 (three) times daily with meals. Every 7 carbs =1 unit 10/09/15   [provider]  ondansetron (ZOFRAN) 4 MG tablet Take 1 tablet (4 mg total) by mouth every 6 (six) hours. 04/18/22   Charlynne Pander, MD  pantoprazole (PROTONIX) 40 MG tablet Take 1 tablet (40 mg total) by mouth daily. 03/20/21   Rodolph Bong, MD  sucralfate (CARAFATE) 1 GM/10ML suspension Take 10 mLs (1 g total) by mouth  every 6 (six) hours for 7 days. 03/19/21 03/26/21  Rodolph Bong, MD      Allergies    Tilapia [fish allergy]    Review of Systems   Review of Systems  Constitutional:  Negative for chills, fatigue and fever.  Respiratory:  Negative for cough and shortness of breath.   Cardiovascular:  Negative for chest pain.  Gastrointestinal:  Positive for abdominal pain, nausea and vomiting. Negative for anorexia, constipation, diarrhea, flatus and melena.  Genitourinary:  Negative for dysuria and hematuria.    Physical Exam Updated Vital Signs BP (!) 133/103   Pulse 82   Temp 97.8 F (36.6 C) (Oral)   Resp 18   Ht 5\' 10"  (1.778 m)   Wt 77.1 kg   SpO2 98%   BMI 24.39 kg/m  Physical Exam Vitals and nursing note reviewed.  Constitutional:      General: He is not in acute distress.    Appearance: He is well-developed. He is ill-appearing.  HENT:     Head: Normocephalic and atraumatic.     Mouth/Throat:     Mouth: Mucous membranes are moist.  Eyes:     Extraocular Movements: Extraocular movements intact.     Conjunctiva/sclera: Conjunctivae normal.     Pupils: Pupils are equal, round, and reactive to light.  Cardiovascular:     Rate and Rhythm: Normal rate and regular rhythm.     Heart  sounds: Normal heart sounds. No murmur heard. Pulmonary:     Effort: Pulmonary effort is normal. No respiratory distress.     Breath sounds: Normal breath sounds.  Abdominal:     Palpations: Abdomen is soft.     Tenderness: There is generalized abdominal tenderness.  Musculoskeletal:        General: No swelling.     Cervical back: Neck supple.  Skin:    General: Skin is warm and dry.     Capillary Refill: Capillary refill takes less than 2 seconds.  Neurological:     Mental Status: He is alert.  Psychiatric:        Mood and Affect: Mood normal.     ED Results / Procedures / Treatments   Labs (all labs ordered are listed, but only abnormal results are displayed) Labs Reviewed  RESP  PANEL BY RT-PCR (RSV, FLU A&B, COVID)  RVPGX2 - Abnormal; Notable for the following components:      Result Value   SARS Coronavirus 2 by RT PCR POSITIVE (*)    All other components within normal limits  COMPREHENSIVE METABOLIC PANEL - Abnormal; Notable for the following components:   Glucose, Bld 274 (*)    Total Bilirubin 1.5 (*)    All other components within normal limits  CBC - Abnormal; Notable for the following components:   WBC 11.2 (*)    All other components within normal limits  URINALYSIS, ROUTINE W REFLEX MICROSCOPIC - Abnormal; Notable for the following components:   Glucose, UA >=500 (*)    Ketones, ur 80 (*)    All other components within normal limits  URINALYSIS, MICROSCOPIC (REFLEX) - Abnormal; Notable for the following components:   Bacteria, UA RARE (*)    All other components within normal limits  LIPASE, BLOOD    EKG EKG Interpretation Date/Time:  Monday April 07 2023 15:22:50 EST Ventricular Rate:  91 PR Interval:  140 QRS Duration:  111 QT Interval:  359 QTC Calculation: 442 R Axis:   100  Text Interpretation: Sinus rhythm Consider right ventricular hypertrophy ST elev, probable normal early repol pattern Confirmed by Virgina Norfolk [phone]) on 04/07/2023 4:02:32 PM  Radiology CT ABDOMEN PELVIS W CONTRAST Result Date: 04/07/2023 CLINICAL DATA:  Acute nonlocalized abdominal pain since this morning. EXAM: CT ABDOMEN AND PELVIS WITH CONTRAST TECHNIQUE: Multidetector CT imaging of the abdomen and pelvis was performed using the standard protocol following bolus administration of intravenous contrast. RADIATION DOSE REDUCTION: This exam was performed according to the departmental dose-optimization program which includes automated exposure control, adjustment of the mA and/or kV according to patient size and/or use of iterative reconstruction technique. CONTRAST:  OMNIPAQUE IOHEXOL 300 MG/ML  SOLN COMPARISON:  05/26/2022 FINDINGS: Lower chest: Lung bases  are clear. Wall thickening around the distal esophagus could represent reflux disease or esophagitis. No specific mass lesion identified. Mild prominence of paraesophageal varices. Hepatobiliary: No focal liver abnormality is seen. No gallstones, gallbladder wall thickening, or biliary dilatation. Pancreas: Unremarkable. No pancreatic ductal dilatation or surrounding inflammatory changes. Spleen: Normal in size without focal abnormality. Adrenals/Urinary Tract: Adrenal glands are unremarkable. Kidneys are normal, without renal calculi, focal lesion, or hydronephrosis. Bladder is unremarkable. Stomach/Bowel: Stomach, small bowel, and colon are not abnormally distended. No wall thickening or inflammatory changes are seen. The appendix is mildly distended at 9.6 mm with mild infiltration around the periappendiceal fat and small appendicoliths present. This could indicate early changes of acute appendicitis. No abscess or collection is identified. Mildly dilated  terminal ileum could represent reactive inflammatory process. The appendix is located medially and inferior to the cecum. Vascular/Lymphatic: Normal caliber abdominal aorta. Scattered right lower quadrant lymph nodes are not pathologically enlarged, likely reactive. Reproductive: Prostate gland is not enlarged. Other: No free air or free fluid in the abdomen. Abdominal wall musculature appears intact. Musculoskeletal: No acute bony abnormalities. IMPRESSION: 1. Appendicoliths are present with mild appendicular dilatation and periappendicular stranding suggesting early acute appendicitis. No abscess or perforation is identified. 2. 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 Possible reflux disease or esophagitis. Electronically Signed   By: Burman Nieves M.D.   On: 04/07/2023 17:13    Procedures Procedures    Medications Ordered in ED Medications  cefTRIAXone (ROCEPHIN) 2 g in sodium chloride 0.9 % 100 mL IVPB (has no  administration in time range)    And  metroNIDAZOLE (FLAGYL) IVPB 500 mg (has no administration in time range)  0.9 %  sodium chloride infusion (has no administration in time range)  sodium chloride 0.9 % bolus 1,000 mL (0 mLs Intravenous Stopped 04/07/23 1711)  ondansetron (ZOFRAN) injection 4 mg (4 mg Intravenous Given 04/07/23 1510)  haloperidol lactate (HALDOL) injection 2 mg (2 mg Intravenous Given 04/07/23 1519)  iohexol (OMNIPAQUE) 300 MG/ML solution 100 mL (100 mLs Intravenous Contrast Given 04/07/23 1541)  LORazepam (ATIVAN) injection 1 mg (1 mg Intravenous Given 04/07/23 1620)  haloperidol lactate (HALDOL) injection 2 mg (2 mg Intravenous Given 04/07/23 1627)    ED Course/ Medical Decision Making/ A&P                                 Medical Decision Making Amount and/or Complexity of Data Reviewed Labs: ordered. Radiology: ordered.  Risk Prescription drug management. Decision regarding hospitalization.   Angello Chien is here with abdominal pain nausea vomiting.  Unremarkable vitals.  No fever.  EKG shows sinus rhythm.  No ischemic changes per my review and interpretation.  History of diabetes marijuana use.  Differential diagnosis likely hyperemesis syndrome from diabetes/gastroparesis versus marijuana use seems less likely to be appendicitis bowel obstruction but we will get CBC CMP lipase urinalysis CT scan abdomen pelvis.  Will give IV Zofran Haldol IV fluids reevaluate.  Lab work for my review and interpretation shows that patient is positive for COVID.  Mild leukocytosis but otherwise lab works unremarkable.  CT scan concerning for early appendicitis.  Clinically he still pretty symptomatic with nausea and vomiting.  He is still tender all over in his abdomen.  Clinically it is pretty difficult to still see if appendicitis fits.  I talked with Dr. Luisa Hart with general surgery who recommends IV antibiotics hydration overnight admit to medicine team for the hyperemesis and they  will evaluate him to see if they would consider taking him to the OR.  Will admit to hospitalist for further care.  This chart was dictated using voice recognition software.  Despite best efforts to proofread,  errors can occur which can change the documentation meaning.         Final Clinical Impression(s) / ED Diagnoses Final diagnoses:  COVID-19  Acute appendicitis, unspecified acute appendicitis type  Hyperemesis    Rx / DC Orders ED Discharge Orders     None         Virgina Norfolk, DO 04/07/23 1753

## 2023-04-07 NOTE — H&P (Signed)
 History and Physical    Logan Washington ZOX:[id number] DOB: [birth year] DOA: 04/07/2023  PCP: Patient, No Pcp Per   Patient coming from: Home   Chief Complaint: Abdominal pain, N/V   HPI: Logan Washington is a 23 y.o. male with medical history significant for type 1 diabetes mellitus who presents with abdominal pain, nausea, and vomiting.  Patient reports his symptoms began this morning.  He reports abdominal pain is "5/10" in severity, cramping in character, generalized throughout the abdomen, and without any alleviating or exacerbating factors that he can identify.  There has not been any associated fevers, diarrhea, melena, hematemesis, or hematochezia.  [facility] ED Course: Upon arrival to the ED, patient is found to be afebrile and saturating well on room air with normal RR, normal HR, and stable BP.  Labs are most notable for glucose 274, WBC 11,200, and positive COVID-19 PCR.  CT is concerning for early appendicitis without abscess or perforation, mild wall thickening of the distal esophagus, and small esophageal varices.  Surgery (Dr. Luisa Hart) was consulted by the ED physician, patient was treated with a liter of NS, Zofran, Ativan, Haldol x 2, Rocephin, and Flagyl, and he was transferred to [facility] for admission.  Review of Systems:  All other systems reviewed and apart from HPI, are negative.  Past Medical History:  Diagnosis Date   Diabetes (HCC)    Diabetes mellitus without complication (HCC)     Past Surgical History:  Procedure Laterality Date   ESOPHAGOGASTRODUODENOSCOPY (EGD) WITH PROPOFOL N/A 03/18/2021   Procedure: ESOPHAGOGASTRODUODENOSCOPY (EGD) WITH PROPOFOL;  Surgeon: Vida Rigger, MD;  Location: [facility] ENDOSCOPY;  Service: Endoscopy;  Laterality: N/A;   TONSILLECTOMY      Social History:   reports that he has never smoked. He has never used smokeless tobacco. He reports current drug use. Drug: Marijuana. He reports that he does not drink alcohol.  Allergies   Allergen Reactions   Tilapia [Fish Allergy] Swelling    Mouth     Family History  Problem Relation Age of Onset   Breast cancer Mother    Lung cancer Mother    Colon cancer Maternal Grandmother    Sudden death Neg Hx    Heart attack Neg Hx    Stomach cancer Neg Hx    Pancreatic cancer Neg Hx    Esophageal cancer Neg Hx    Rectal cancer Neg Hx      Prior to Admission medications   Medication Sig Start Date End Date Taking? Authorizing Provider  B-D UF III MINI PEN NEEDLES 31G X 5 MM MISC FOR USE WITH INSULIN PENS 10/12/15   [provider]  dicyclomine (BENTYL) 20 MG tablet Take 1 tablet (20 mg total) by mouth 4 (four) times daily -  before meals and at bedtime. Patient not taking: Reported on 03/15/2021 11/17/19   Arnaldo Natal, NP  LANTUS SOLOSTAR 100 UNIT/ML Solostar Pen Inject 25 Units into the skin at bedtime. Patient taking differently: Inject 31 Units into the skin at bedtime. 01/25/18   Osvaldo Shipper, MD  NOVOLOG FLEXPEN 100 UNIT/ML FlexPen Inject 6-7 Units into the skin 3 (three) times daily with meals. Every 7 carbs =1 unit 10/09/15   [provider]  ondansetron (ZOFRAN) 4 MG tablet Take 1 tablet (4 mg total) by mouth every 6 (six) hours. 04/18/22   Charlynne Pander, MD  pantoprazole (PROTONIX) 40 MG tablet Take 1 tablet (40 mg total) by mouth daily. 03/20/21   Rodolph Bong, MD  sucralfate (CARAFATE) 1 GM/10ML suspension Take 10 mLs (1 g total) by mouth every 6 (six) hours for 7 days. 03/19/21 03/26/21  Rodolph Bong, MD    Physical Exam: Vitals:   04/07/23 1729 04/07/23 1922 04/07/23 1925 04/07/23 2054  BP:  (!) 140/75  131/67  Pulse: 82   87  Resp: 18 10  19   Temp:   98 F (36.7 C) 98.6 F (37 C)  TempSrc:   Oral Oral  SpO2: 98%   100%  Weight:      Height:        Constitutional: NAD, no pallor or diaphoresis   Eyes: PERTLA, lids and conjunctivae normal ENMT: Mucous membranes are moist. Posterior pharynx clear of any  exudate or lesions.   Neck: supple, no masses  Respiratory: no wheezing, no crackles. No accessory muscle use.  Cardiovascular: S1 & S2 heard, regular rate and rhythm. No extremity edema.   Abdomen: Soft, tender in mid and lower abdomen. Bowel sounds active.  Musculoskeletal: no clubbing / cyanosis. No joint deformity upper and lower extremities.   Skin: no significant rashes, lesions, ulcers. Warm, dry, well-perfused. Neurologic: CN 2-12 grossly intact. Moving all extremities. Sleeping, wakes to voice.  Psychiatric: Calm. Cooperative.    Labs and Imaging on Admission: I have personally reviewed following labs and imaging studies  CBC: Recent Labs  Lab 04/07/23 1407  WBC 11.2*  HGB 15.6  HCT 45.7  MCV 84.5  PLT 342   Basic Metabolic Panel: Recent Labs  Lab 04/07/23 1407  NA 136  K 4.9  CL 100  CO2 23  GLUCOSE 274*  BUN 13  CREATININE 0.90  CALCIUM 9.2   GFR: Estimated Creatinine Clearance: 130.7 mL/min (by C-G formula based on SCr of 0.9 mg/dL). Liver Function Tests: Recent Labs  Lab 04/07/23 1407  AST 22  ALT 14  ALKPHOS 66  BILITOT 1.5*  PROT 7.6  ALBUMIN 4.2   Recent Labs  Lab 04/07/23 1407  LIPASE 22   No results for input(s): "AMMONIA" in the last 168 hours. Coagulation Profile: No results for input(s): "INR", "PROTIME" in the last 168 hours. Cardiac Enzymes: No results for input(s): "CKTOTAL", "CKMB", "CKMBINDEX", "TROPONINI" in the last 168 hours. BNP (last 3 results) No results for input(s): "PROBNP" in the last 8760 hours. HbA1C: No results for input(s): "HGBA1C" in the last 72 hours. CBG: No results for input(s): "GLUCAP" in the last 168 hours. Lipid Profile: No results for input(s): "CHOL", "HDL", "LDLCALC", "TRIG", "CHOLHDL", "LDLDIRECT" in the last 72 hours. Thyroid Function Tests: No results for input(s): "TSH", "T4TOTAL", "FREET4", "T3FREE", "THYROIDAB" in the last 72 hours. Anemia Panel: No results for input(s): "VITAMINB12",  "FOLATE", "FERRITIN", "TIBC", "IRON", "RETICCTPCT" in the last 72 hours. Urine analysis:    Component Value Date/Time   COLORURINE YELLOW 04/07/2023 1633   APPEARANCEUR CLEAR 04/07/2023 1633   LABSPEC 1.015 04/07/2023 1633   PHURINE 6.5 04/07/2023 1633   GLUCOSEU >=500 (A) 04/07/2023 1633   HGBUR NEGATIVE 04/07/2023 1633   BILIRUBINUR NEGATIVE 04/07/2023 1633   KETONESUR 80 (A) 04/07/2023 1633   PROTEINUR NEGATIVE 04/07/2023 1633   NITRITE NEGATIVE 04/07/2023 1633   LEUKOCYTESUR NEGATIVE 04/07/2023 1633   Sepsis Labs: @LABRCNTIP (procalcitonin:4,lacticidven:4) ) Recent Results (from the past 240 hours)  Resp panel by RT-PCR (RSV, Flu A&B, Covid) Anterior Nasal Swab     Status: Abnormal   Collection Time: 04/07/23  3:00 PM   Specimen: Anterior Nasal Swab  Result Value Ref Range Status   SARS Coronavirus  2 by RT PCR POSITIVE (A) NEGATIVE Final    Comment: (NOTE) SARS-CoV-2 target nucleic acids are DETECTED.  The SARS-CoV-2 RNA is generally detectable in upper respiratory specimens during the acute phase of infection. Positive results are indicative of the presence of the identified virus, but do not rule out bacterial infection or co-infection with other pathogens not detected by the test. Clinical correlation with patient history and other diagnostic information is necessary to determine patient infection status. The expected result is Negative.  Fact Sheet for Patients: BloggerCourse.com  Fact Sheet for Healthcare Providers: SeriousBroker.it  This test is not yet approved or cleared by the [place] FDA and  has been authorized for detection and/or diagnosis of SARS-CoV-2 by FDA under an Emergency Use Authorization (EUA).  This EUA will remain in effect (meaning this test can be used) for the duration of  the COVID-19 declaration under Section 564(b)(1) of the A ct, 21 U.S.C. section 360bbb-3(b)(1), unless the  authorization is terminated or revoked sooner.     Influenza A by PCR NEGATIVE NEGATIVE Final   Influenza B by PCR NEGATIVE NEGATIVE Final    Comment: (NOTE) The Xpert Xpress SARS-CoV-2/FLU/RSV plus assay is intended as an aid in the diagnosis of influenza from Nasopharyngeal swab specimens and should not be used as a sole basis for treatment. Nasal washings and aspirates are unacceptable for Xpert Xpress SARS-CoV-2/FLU/RSV testing.  Fact Sheet for Patients: BloggerCourse.com  Fact Sheet for Healthcare Providers: SeriousBroker.it  This test is not yet approved or cleared by the [place] FDA and has been authorized for detection and/or diagnosis of SARS-CoV-2 by FDA under an Emergency Use Authorization (EUA). This EUA will remain in effect (meaning this test can be used) for the duration of the COVID-19 declaration under Section 564(b)(1) of the Act, 21 U.S.C. section 360bbb-3(b)(1), unless the authorization is terminated or revoked.     Resp Syncytial Virus by PCR NEGATIVE NEGATIVE Final    Comment: (NOTE) Fact Sheet for Patients: BloggerCourse.com  Fact Sheet for Healthcare Providers: SeriousBroker.it  This test is not yet approved or cleared by the [place] FDA and has been authorized for detection and/or diagnosis of SARS-CoV-2 by FDA under an Emergency Use Authorization (EUA). This EUA will remain in effect (meaning this test can be used) for the duration of the COVID-19 declaration under Section 564(b)(1) of the Act, 21 U.S.C. section 360bbb-3(b)(1), unless the authorization is terminated or revoked.  Performed at [facility], [address] [place], [place] [address]      Radiological Exams on Admission: CT ABDOMEN PELVIS W CONTRAST Result Date: 04/07/2023 CLINICAL DATA:  Acute nonlocalized abdominal pain since this morning. EXAM: CT ABDOMEN  AND PELVIS WITH CONTRAST TECHNIQUE: Multidetector CT imaging of the abdomen and pelvis was performed using the standard protocol following bolus administration of intravenous contrast. RADIATION DOSE REDUCTION: This exam was performed according to the departmental dose-optimization program which includes automated exposure control, adjustment of the mA and/or kV according to patient size and/or use of iterative reconstruction technique. CONTRAST:  OMNIPAQUE IOHEXOL 300 MG/ML  SOLN COMPARISON:  05/26/2022 FINDINGS: Lower chest: Lung bases are clear. Wall thickening around the distal esophagus could represent reflux disease or esophagitis. No specific mass lesion identified. Mild prominence of paraesophageal varices. Hepatobiliary: No focal liver abnormality is seen. No gallstones, gallbladder wall thickening, or biliary dilatation. Pancreas: Unremarkable. No pancreatic ductal dilatation or surrounding inflammatory changes. Spleen: Normal in size without focal abnormality. Adrenals/Urinary Tract: Adrenal glands are  unremarkable. Kidneys are normal, without renal calculi, focal lesion, or hydronephrosis. Bladder is unremarkable. Stomach/Bowel: Stomach, small bowel, and colon are not abnormally distended. No wall thickening or inflammatory changes are seen. The appendix is mildly distended at 9.6 mm with mild infiltration around the periappendiceal fat and small appendicoliths present. This could indicate early changes of acute appendicitis. No abscess or collection is identified. Mildly dilated terminal ileum could represent reactive inflammatory process. The appendix is located medially and inferior to the cecum. Vascular/Lymphatic: Normal caliber abdominal aorta. Scattered right lower quadrant lymph nodes are not pathologically enlarged, likely reactive. Reproductive: Prostate gland is not enlarged. Other: No free air or free fluid in the abdomen. Abdominal wall musculature appears intact. Musculoskeletal: No  acute bony abnormalities. IMPRESSION: 1. Appendicoliths are present with mild appendicular dilatation and periappendicular stranding suggesting early acute appendicitis. No abscess or perforation is identified. 2. 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 Possible reflux disease or esophagitis. Electronically Signed   By: Burman Nieves M.D.   On: 04/07/2023 17:13    EKG: Independently reviewed. Sinus rhythm, RVH.   Assessment/Plan   1. ?Appendicitis    - Surgery consulted and empiric antibiotics started in ED  - Continue bowel rest, IV antibiotics, IVF hydration, supportive care, and follow-up on surgery assessment and recommendations   2. Intractable N/V  - In setting of COVID, chronic marijuana use, DM, and possible appendicitis  - Continue bowel-rest, IVF hydration, antiemetics, and monitor electrolytes and renal function    3. Esophageal thickening; esophageal varices    - Esophageal thickening and small paraesophageal varices noted on CT in ED  - Hx of esophagitis and gastritis on EGD in November 2021, previously on PPI   - Resume PPI, use IV for now, follow-up with GI    4. Type I DM  - A1c was 7.9% in April 2024  - Check CBGs and continue long- and short-acting insulin   5. COVID - COVID-19 PCR is positive in ED  - No respiratory sxs, N/V could be related  - Isolation, supportive care    DVT prophylaxis: SCDs  Code Status: Full  Level of Care: Level of care: Med-Surg Family Communication: none present  Disposition Plan:  Patient is from: home  Anticipated d/c is to: home  Anticipated d/c date is: 04/09/23  Patient currently: Pending surgery consultation, clinical improvement  Consults called: Surgery  Admission status: Inpatient     Briscoe Deutscher, MD [facility]  04/07/2023, 11:36 PM

## 2023-04-07 NOTE — ED Triage Notes (Addendum)
 Pt brought in by [facility] Vomiting and abd pain since this am    denies diarrhea HX of type 2 CBG with [facility] 202  165 105 85 HR 18 left AC per [facility]

## 2023-04-08 ENCOUNTER — Other Ambulatory Visit: Payer: Self-pay

## 2023-04-08 ENCOUNTER — Inpatient Hospital Stay (HOSPITAL_COMMUNITY): Payer: Medicaid Other

## 2023-04-08 ENCOUNTER — Encounter (HOSPITAL_COMMUNITY): Payer: Self-pay | Admitting: Family Medicine

## 2023-04-08 ENCOUNTER — Encounter (HOSPITAL_COMMUNITY)
Admission: EM | Disposition: A | Discharge status: Home / Self Care | Payer: Self-pay | Source: Home / Self Care | Attending: Emergency Medicine

## 2023-04-08 DIAGNOSIS — K3580 Unspecified acute appendicitis: Secondary | ICD-10-CM | POA: Diagnosis not present

## 2023-04-08 DIAGNOSIS — K37 Unspecified appendicitis: Secondary | ICD-10-CM | POA: Diagnosis not present

## 2023-04-08 HISTORY — PX: LAPAROSCOPIC APPENDECTOMY: SHX408

## 2023-04-08 LAB — GLUCOSE, CAPILLARY
Glucose-Capillary: 193 mg/dL — ABNORMAL HIGH (ref 70–99)
Glucose-Capillary: 153 mg/dL — ABNORMAL HIGH (ref 70–99)
Glucose-Capillary: 140 mg/dL — ABNORMAL HIGH (ref 70–99)
Glucose-Capillary: 148 mg/dL — ABNORMAL HIGH (ref 70–99)
Glucose-Capillary: 176 mg/dL — ABNORMAL HIGH (ref 70–99)

## 2023-04-08 LAB — CBC
HCT: 40.9 % (ref 39.0–52.0)
Hemoglobin: 14.4 g/dL (ref 13.0–17.0)
MCH: 29.9 pg (ref 26.0–34.0)
MCHC: 35.2 g/dL (ref 30.0–36.0)
MCV: 84.9 fL (ref 80.0–100.0)
Platelets: 335 10*3/uL (ref 150–400)
RBC: 4.82 MIL/uL (ref 4.22–5.81)
RDW: 12.7 % (ref 11.5–15.5)
WBC: 7.8 10*3/uL (ref 4.0–10.5)
nRBC: 0 % (ref 0.0–0.2)

## 2023-04-08 LAB — MAGNESIUM: Magnesium: 1.6 mg/dL — ABNORMAL LOW (ref 1.7–2.4)

## 2023-04-08 LAB — HEPATIC FUNCTION PANEL
ALT: 11 U/L (ref 0–44)
AST: 14 U/L — ABNORMAL LOW (ref 15–41)
Albumin: 3.4 g/dL — ABNORMAL LOW (ref 3.5–5.0)
Alkaline Phosphatase: 51 U/L (ref 38–126)
Bilirubin, Direct: 0.2 mg/dL (ref 0.0–0.2)
Indirect Bilirubin: 1.3 mg/dL — ABNORMAL HIGH (ref 0.3–0.9)
Total Bilirubin: 1.5 mg/dL — ABNORMAL HIGH (ref 0.0–1.2)
Total Protein: 6.4 g/dL — ABNORMAL LOW (ref 6.5–8.1)

## 2023-04-08 LAB — BASIC METABOLIC PANEL
Anion gap: 5 (ref 5–15)
BUN: 13 mg/dL (ref 6–20)
CO2: 23 mmol/L (ref 22–32)
Calcium: 8.3 mg/dL — ABNORMAL LOW (ref 8.9–10.3)
Chloride: 107 mmol/L (ref 98–111)
Creatinine, Ser: 1.05 mg/dL (ref 0.61–1.24)
GFR, Estimated: >60 mL/min (ref >60)
Glucose, Bld: 185 mg/dL — ABNORMAL HIGH (ref 70–99)
Potassium: 3.5 mmol/L (ref 3.5–5.1)
Sodium: 135 mmol/L (ref 135–145)

## 2023-04-08 LAB — HIV ANTIBODY (ROUTINE TESTING W REFLEX): HIV Screen 4th Generation wRfx: NONREACTIVE

## 2023-04-08 SURGERY — APPENDECTOMY, LAPAROSCOPIC
Anesthesia: General | Site: Abdomen

## 2023-04-08 MED ORDER — HYDROMORPHONE HCL 1 MG/ML IJ SOLN
INTRAMUSCULAR | Status: AC
  Filled 2023-04-08: qty 0.5

## 2023-04-08 MED ORDER — ACETAMINOPHEN 650 MG RE SUPP: 650.0000 mg | Freq: Four times a day (QID) | RECTAL | Status: DC | PRN

## 2023-04-08 MED ORDER — MIDAZOLAM HCL 2 MG/2ML IJ SOLN
INTRAMUSCULAR | Status: DC | PRN
  Administered 2023-04-08: 2 mg via INTRAVENOUS

## 2023-04-08 MED ORDER — MAGNESIUM SULFATE 2 GM/50ML IV SOLN
2.0000 g | Freq: Once | INTRAVENOUS | Status: AC
  Administered 2023-04-08: 2 g via INTRAVENOUS
  Filled 2023-04-08: qty 50

## 2023-04-08 MED ORDER — DEXMEDETOMIDINE HCL IN NACL 80 MCG/20ML IV SOLN
INTRAVENOUS | Status: DC | PRN
Start: 2023-04-08 — End: 2023-04-08
  Administered 2023-04-08: 8 ug via INTRAVENOUS

## 2023-04-08 MED ORDER — ESMOLOL HCL 100 MG/10ML IV SOLN
INTRAVENOUS | Status: DC | PRN
  Administered 2023-04-08 (×2): 25 mg via INTRAVENOUS

## 2023-04-08 MED ORDER — OXYCODONE HCL 5 MG PO TABS: 5.0000 mg | ORAL_TABLET | Freq: Once | ORAL | Status: DC | PRN

## 2023-04-08 MED ORDER — 0.9 % SODIUM CHLORIDE (POUR BTL) OPTIME
TOPICAL | Status: DC | PRN
  Administered 2023-04-08: 1000 mL

## 2023-04-08 MED ORDER — DROPERIDOL 2.5 MG/ML IJ SOLN: 0.6250 mg | Freq: Once | INTRAMUSCULAR | Status: DC | PRN

## 2023-04-08 MED ORDER — HYDROMORPHONE HCL 1 MG/ML IJ SOLN
INTRAMUSCULAR | Status: DC | PRN
  Administered 2023-04-08: .5 mg via INTRAVENOUS

## 2023-04-08 MED ORDER — ACETAMINOPHEN 500 MG PO TABS: 1000.0000 mg | ORAL_TABLET | Freq: Four times a day (QID) | ORAL | Status: DC | PRN

## 2023-04-08 MED ORDER — OXYCODONE HCL 5 MG/5ML PO SOLN: 5.0000 mg | Freq: Once | ORAL | Status: DC | PRN

## 2023-04-08 MED ORDER — ORAL CARE MOUTH RINSE: 15.0000 mL | Freq: Once | OROMUCOSAL | Status: AC

## 2023-04-08 MED ORDER — OXYCODONE HCL 5 MG PO TABS: 5.0000 mg | ORAL_TABLET | Freq: Four times a day (QID) | ORAL | 0 refills | Status: AC | PRN

## 2023-04-08 MED ORDER — KETOROLAC TROMETHAMINE 30 MG/ML IJ SOLN
INTRAMUSCULAR | Status: DC | PRN
Start: 2023-04-08 — End: 2023-04-08
  Administered 2023-04-08: 30 mg via INTRAVENOUS

## 2023-04-08 MED ORDER — DOCUSATE SODIUM 100 MG PO CAPS: 100.0000 mg | ORAL_CAPSULE | Freq: Two times a day (BID) | ORAL | Status: DC | PRN

## 2023-04-08 MED ORDER — LACTATED RINGERS IV SOLN: INTRAVENOUS | Status: DC

## 2023-04-08 MED ORDER — ROCURONIUM BROMIDE 10 MG/ML (PF) SYRINGE
PREFILLED_SYRINGE | INTRAVENOUS | Status: DC | PRN
  Administered 2023-04-08: 60 mg via INTRAVENOUS

## 2023-04-08 MED ORDER — DEXAMETHASONE SODIUM PHOSPHATE 10 MG/ML IJ SOLN
INTRAMUSCULAR | Status: DC | PRN
Start: 2023-04-08 — End: 2023-04-08
  Administered 2023-04-08: 4 mg via INTRAVENOUS

## 2023-04-08 MED ORDER — KETOROLAC TROMETHAMINE 30 MG/ML IJ SOLN
INTRAMUSCULAR | Status: AC
  Filled 2023-04-08: qty 3

## 2023-04-08 MED ORDER — FENTANYL CITRATE (PF) 250 MCG/5ML IJ SOLN
INTRAMUSCULAR | Status: AC
  Filled 2023-04-08: qty 5

## 2023-04-08 MED ORDER — BUPIVACAINE-EPINEPHRINE (PF) 0.25% -1:200000 IJ SOLN
INTRAMUSCULAR | Status: AC
  Filled 2023-04-08: qty 30

## 2023-04-08 MED ORDER — FENTANYL CITRATE (PF) 250 MCG/5ML IJ SOLN
INTRAMUSCULAR | Status: DC | PRN
  Administered 2023-04-08: 50 ug via INTRAVENOUS
  Administered 2023-04-08 (×2): 100 ug via INTRAVENOUS

## 2023-04-08 MED ORDER — BUPIVACAINE-EPINEPHRINE 0.25% -1:200000 IJ SOLN
INTRAMUSCULAR | Status: DC | PRN
  Administered 2023-04-08: 8 mL

## 2023-04-08 MED ORDER — HYDROMORPHONE HCL 1 MG/ML IJ SOLN: 0.2500 mg | INTRAMUSCULAR | Status: DC | PRN

## 2023-04-08 MED ORDER — ENOXAPARIN SODIUM 40 MG/0.4ML IJ SOSY: 40.0000 mg | PREFILLED_SYRINGE | INTRAMUSCULAR | Status: DC

## 2023-04-08 MED ORDER — LIDOCAINE 2% (20 MG/ML) 5 ML SYRINGE
INTRAMUSCULAR | Status: DC | PRN
  Administered 2023-04-08: 80 mg via INTRAVENOUS

## 2023-04-08 MED ORDER — ONDANSETRON HCL 4 MG/2ML IJ SOLN
INTRAMUSCULAR | Status: DC | PRN
  Administered 2023-04-08: 4 mg via INTRAVENOUS

## 2023-04-08 MED ORDER — INSULIN ASPART 100 UNIT/ML IJ SOLN
0.0000 [IU] | INTRAMUSCULAR | Status: DC | PRN
Start: 2023-04-08 — End: 2023-04-08

## 2023-04-08 MED ORDER — MIDAZOLAM HCL 2 MG/2ML IJ SOLN
INTRAMUSCULAR | Status: AC
  Filled 2023-04-08: qty 2

## 2023-04-08 MED ORDER — PROPOFOL 10 MG/ML IV BOLUS
INTRAVENOUS | Status: AC
  Filled 2023-04-08: qty 20

## 2023-04-08 MED ORDER — PROPOFOL 10 MG/ML IV BOLUS
INTRAVENOUS | Status: DC | PRN
  Administered 2023-04-08: 200 mg via INTRAVENOUS

## 2023-04-08 MED ORDER — SODIUM CHLORIDE 0.9 % IR SOLN
Status: DC | PRN
  Administered 2023-04-08: 1000 mL

## 2023-04-08 MED ORDER — SUGAMMADEX SODIUM 200 MG/2ML IV SOLN
INTRAVENOUS | Status: DC | PRN
  Administered 2023-04-08: 200 mg via INTRAVENOUS

## 2023-04-08 MED ORDER — HYDROMORPHONE HCL 1 MG/ML IJ SOLN: 0.5000 mg | INTRAMUSCULAR | Status: DC | PRN

## 2023-04-08 MED ORDER — CHLORHEXIDINE GLUCONATE 0.12 % MT SOLN
15.0000 mL | Freq: Once | OROMUCOSAL | Status: AC
  Administered 2023-04-08: 15 mL via OROMUCOSAL

## 2023-04-08 SURGICAL SUPPLY — 39 items
BAG COUNTER SPONGE SURGICOUNT (BAG) ×1 IMPLANT
BENZOIN TINCTURE PRP APPL 2/3 (GAUZE/BANDAGES/DRESSINGS) ×1 IMPLANT
BLADE CLIPPER SURG (BLADE) IMPLANT
CANISTER SUCT 3000ML PPV (MISCELLANEOUS) IMPLANT
CHLORAPREP W/TINT 26 (MISCELLANEOUS) ×1 IMPLANT
COVER SURGICAL LIGHT HANDLE (MISCELLANEOUS) ×1 IMPLANT
CUTTER FLEX LINEAR 45M (STAPLE) ×1 IMPLANT
DRSG TEGADERM 2-3/8X2-3/4 SM (GAUZE/BANDAGES/DRESSINGS) ×2 IMPLANT
DRSG TEGADERM 4X4.75 (GAUZE/BANDAGES/DRESSINGS) ×1 IMPLANT
ELECT REM PT RETURN 9FT ADLT (ELECTROSURGICAL) ×1 IMPLANT
ELECTRODE REM PT RTRN 9FT ADLT (ELECTROSURGICAL) ×1 IMPLANT
ENDOLOOP SUT PDS II 0 18 (SUTURE) IMPLANT
GAUZE SPONGE 2X2 8PLY STRL LF (GAUZE/BANDAGES/DRESSINGS) ×1 IMPLANT
GLOVE BIO SURGEON STRL SZ7 (GLOVE) ×1 IMPLANT
GLOVE BIOGEL PI IND STRL 7.5 (GLOVE) ×1 IMPLANT
GOWN STRL REUS W/ TWL LRG LVL3 (GOWN DISPOSABLE) ×3 IMPLANT
IRRIG SUCT STRYKERFLOW 2 WTIP (MISCELLANEOUS) ×1 IMPLANT
IRRIGATION SUCT STRKRFLW 2 WTP (MISCELLANEOUS) IMPLANT
KIT BASIN OR (CUSTOM PROCEDURE TRAY) ×1 IMPLANT
KIT TURNOVER KIT B (KITS) ×1 IMPLANT
NS IRRIG 1000ML POUR BTL (IV SOLUTION) ×1 IMPLANT
PAD ARMBOARD 7.5X6 YLW CONV (MISCELLANEOUS) ×2 IMPLANT
RELOAD STAPLE 45 3.5 BLU ETS (ENDOMECHANICALS) ×1 IMPLANT
RELOAD STAPLE TA45 3.5 REG BLU (ENDOMECHANICALS) ×1 IMPLANT
SET TUBE SMOKE EVAC HIGH FLOW (TUBING) ×1 IMPLANT
SHEARS HARMONIC ACE PLUS 36CM (ENDOMECHANICALS) ×1 IMPLANT
SLEEVE Z-THREAD 5X100MM (TROCAR) ×1 IMPLANT
SPECIMEN JAR SMALL (MISCELLANEOUS) ×1 IMPLANT
STRIP CLOSURE SKIN 1/2X4 (GAUZE/BANDAGES/DRESSINGS) ×1 IMPLANT
SUT MNCRL AB 4-0 PS2 18 (SUTURE) ×1 IMPLANT
SYS BAG RETRIEVAL 10MM (BASKET) ×1 IMPLANT
SYSTEM BAG RETRIEVAL 10MM (BASKET) ×1 IMPLANT
TOWEL GREEN STERILE (TOWEL DISPOSABLE) ×1 IMPLANT
TOWEL GREEN STERILE FF (TOWEL DISPOSABLE) ×1 IMPLANT
TRAY LAPAROSCOPIC MC (CUSTOM PROCEDURE TRAY) ×1 IMPLANT
TROCAR BALLN 12MMX100 BLUNT (TROCAR) ×1 IMPLANT
TROCAR Z-THREAD OPTICAL 5X100M (TROCAR) ×1 IMPLANT
WARMER LAPAROSCOPE (MISCELLANEOUS) ×1 IMPLANT
WATER STERILE IRR 1000ML POUR (IV SOLUTION) ×1 IMPLANT

## 2023-04-08 NOTE — Transfer of Care (Signed)
 Immediate Anesthesia Transfer of Care Note  Patient: Logan Washington  Procedure(s) Performed: APPENDECTOMY LAPAROSCOPIC (Abdomen)  Patient Location: PACU  Anesthesia Type:General  Level of Consciousness: drowsy  Airway & Oxygen Therapy: Patient Spontanous Breathing and Patient connected to face mask oxygen  Post-op Assessment: Report given to RN and Post -op Vital signs reviewed and stable  Post vital signs: Reviewed and stable  Last Vitals:  Vitals Value Taken Time  BP 129/76 04/08/23 1428  Temp 36.6 C 04/08/23 1430  Pulse 83 04/08/23 1429  Resp 11 04/08/23 1429  SpO2 99 % 04/08/23 1429  Vitals shown include unfiled device data.  Last Pain:  Vitals:   04/08/23 1400  TempSrc:   PainSc: Asleep      Patients Stated Pain Goal: 0 (04/08/23 0906)  Complications: No notable events documented.

## 2023-04-08 NOTE — Plan of Care (Signed)
  Problem: Respiratory: Goal: Will maintain a patent airway Outcome: Progressing   Problem: Clinical Measurements: Goal: Ability to maintain clinical measurements within normal limits will improve Outcome: Progressing   Problem: Activity: Goal: Risk for activity intolerance will decrease Outcome: Progressing   Problem: Elimination: Goal: Will not experience complications related to bowel motility Outcome: Progressing Goal: Will not experience complications related to urinary retention Outcome: Progressing   Problem: Pain Managment: Goal: General experience of comfort will improve and/or be controlled Outcome: Progressing   Problem: Nutritional: Goal: Maintenance of adequate nutrition will improve Outcome: Progressing

## 2023-04-08 NOTE — Anesthesia Preprocedure Evaluation (Addendum)
 Anesthesia Evaluation  Patient identified by MRN, date of birth, ID band Patient awake    Reviewed: Allergy & Precautions, NPO status , Patient's Chart, lab work & pertinent test results  History of Anesthesia Complications Negative for: history of anesthetic complications  Airway Mallampati: I  TM Distance: >3 FB Neck ROM: Full    Dental  (+) Teeth Intact, Dental Advisory Given   Pulmonary neg shortness of breath, neg sleep apnea, neg COPD, neg recent URI, Current Smoker and Patient abstained from smoking.   breath sounds clear to auscultation       Cardiovascular negative cardio ROS  Rhythm:Regular     Neuro/Psych  PSYCHIATRIC DISORDERS      negative neurological ROS     GI/Hepatic Neg liver ROS,neg GERD  ,,Painful swallowing, denies nausea and vomiting   Endo/Other  diabetes, Poorly Controlled, Insulin Dependent  Lab Results      Component                Value               Date                      HGBA1C                   7.7 (H)             03/15/2021             Renal/GU Renal disease     Musculoskeletal negative musculoskeletal ROS (+)    Abdominal   Peds  Hematology negative hematology ROS (+) Lab Results      Component                Value               Date                      WBC                      9.8                 03/18/2021                HGB                      15.1                03/18/2021                HCT                      42.8                03/18/2021                MCV                      82.9                03/18/2021                PLT                      301                 03/18/2021  Anesthesia Other Findings   Reproductive/Obstetrics                             Anesthesia Physical Anesthesia Plan  ASA: 2  Anesthesia Plan: General   Post-op Pain Management: Ofirmev IV (intra-op)* and Toradol IV (intra-op)*   Induction:   PONV  Risk Score and Plan: 3 and Treatment may vary due to age or medical condition, Ondansetron, Dexamethasone and Midazolam  Airway Management Planned: Oral ETT  Additional Equipment:   Intra-op Plan:   Post-operative Plan: Extubation in OR  Informed Consent: I have reviewed the patients History and Physical, chart, labs and discussed the procedure including the risks, benefits and alternatives for the proposed anesthesia with the patient or authorized representative who has indicated his/her understanding and acceptance.     Dental advisory given  Plan Discussed with: CRNA  Anesthesia Plan Comments:         Anesthesia Quick Evaluation

## 2023-04-08 NOTE — Progress Notes (Signed)
   04/08/23 0809  SDOH Interventions  Transportation Interventions Inpatient TOC   Transportation resources added to AVS

## 2023-04-08 NOTE — Discharge Instructions (Signed)
 [facility] ______[facility] LAPAROSCOPIC SURGERY: POST OP INSTRUCTIONS Always review your discharge instruction sheet given to you by the facility where your surgery was performed. IF YOU HAVE DISABILITY OR FAMILY LEAVE FORMS, YOU MUST BRING THEM TO THE OFFICE FOR PROCESSING.   DO NOT GIVE THEM TO YOUR DOCTOR.  A prescription for pain medication may be given to you upon discharge.  Take your pain medication as prescribed, if needed.  If narcotic pain medicine is not needed, then you may take acetaminophen (Tylenol) or ibuprofen (Advil) as needed. Take your usually prescribed medications unless otherwise directed. If you need a refill on your pain medication, please contact your pharmacy.  They will contact our office to request authorization. Prescriptions will not be filled after 5pm or on week-ends. You should follow a light diet the first few days after arrival home, such as soup and crackers, etc.  Be sure to include lots of fluids daily. Most patients will experience some swelling and bruising in the area of the incisions.  Ice packs will help.  Swelling and bruising can take several days to resolve.  It is common to experience some constipation if taking pain medication after surgery.  Increasing fluid intake and taking a stool softener (such as Colace) will usually help or prevent this problem from occurring.  A mild laxative (Milk of Magnesia or Miralax) should be taken according to package instructions if there are no bowel movements after 48 hours. Unless discharge instructions indicate otherwise, you may remove your bandages 24-48 hours after surgery, and you may shower at that time.  You may have steri-strips (small skin tapes) in place directly over the incision.  These strips should be left on the skin for 7-10 days.  If your surgeon used skin glue on the incision, you may shower in 24 hours.  The glue will flake off over the next 2-3 weeks.  Any sutures or staples will be  removed at the office during your follow-up visit. ACTIVITIES:  You may resume regular (light) daily activities beginning the next day--such as daily self-care, walking, climbing stairs--gradually increasing activities as tolerated.  You may have sexual intercourse when it is comfortable.  Refrain from any heavy lifting or straining until approved by your doctor. You may drive when you are no longer taking prescription pain medication, you can comfortably wear a seatbelt, and you can safely maneuver your car and apply brakes. RETURN TO WORK:  __________________________________________________________ Logan Washington should see your doctor in the office for a follow-up appointment approximately 2-3 weeks after your surgery.  Make sure that you call for this appointment within a day or two after you arrive home to insure a convenient appointment time. OTHER INSTRUCTIONS: __________________________________________________________________________________________________________________________ __________________________________________________________________________________________________________________________ WHEN TO CALL YOUR DOCTOR: Fever over 101.0 Inability to urinate Continued bleeding from incision. Increased pain, redness, or drainage from the incision. Increasing abdominal pain  The clinic staff is available to answer your questions during regular business hours.  Please dont hesitate to call and ask to speak to one of the nurses for clinical concerns.  If you have a medical emergency, go to the nearest emergency room or call 911.  A surgeon from [facility] is always on call at the hospital. [address], [address], [place], [place]  [address] ? [address] [address], [place], [place]   [address] [phone] ? [phone] ? FAX [phone] Web site: [email or web address]    Managing Your Pain After Surgery Without Opioids    Thank you for  participating in our program to  help patients manage their pain after surgery without opioids. This is part of our effort to provide you with the best care possible, without exposing you or your family to the risk that opioids pose.  What pain can I expect after surgery? You can expect to have some pain after surgery. This is normal. The pain is typically worse the day after surgery, and quickly begins to get better. Many studies have found that many patients are able to manage their pain after surgery with Over-the-Counter (OTC) medications such as Tylenol and Motrin. If you have a condition that does not allow you to take Tylenol or Motrin, notify your surgical team.  How will I manage my pain? The best strategy for controlling your pain after surgery is around the clock pain control with Tylenol (acetaminophen) and Motrin (ibuprofen or Advil). Alternating these medications with each other allows you to maximize your pain control. In addition to Tylenol and Motrin, you can use heating pads or ice packs on your incisions to help reduce your pain.  How will I alternate your regular strength over-the-counter pain medication? You will take a dose of pain medication every three hours. Start by taking 650 mg of Tylenol (2 pills of 325 mg) 3 hours later take 600 mg of Motrin (3 pills of 200 mg) 3 hours after taking the Motrin take 650 mg of Tylenol 3 hours after that take 600 mg of Motrin.   - 1 -  See example - if your first dose of Tylenol is at 12:00 PM   12:00 PM Tylenol 650 mg (2 pills of 325 mg)  3:00 PM Motrin 600 mg (3 pills of 200 mg)  6:00 PM Tylenol 650 mg (2 pills of 325 mg)  9:00 PM Motrin 600 mg (3 pills of 200 mg)  Continue alternating every 3 hours   We recommend that you follow this schedule around-the-clock for at least 3 days after surgery, or until you feel that it is no longer needed. Use the table on the last page of this handout to keep track of the medications you are taking. Important: Do not take  more than 3000mg  of Tylenol or 3200mg  of Motrin in a 24-hour period. Do not take ibuprofen/Motrin if you have a history of bleeding stomach ulcers, severe kidney disease, &/or actively taking a blood thinner  What if I still have pain? If you have pain that is not controlled with the over-the-counter pain medications (Tylenol and Motrin or Advil) you might have what we call breakthrough pain. You will receive a prescription for a small amount of an opioid pain medication such as Oxycodone, Tramadol, or Tylenol with Codeine. Use these opioid pills in the first 24 hours after surgery if you have breakthrough pain. Do not take more than 1 pill every 4-6 hours.  If you still have uncontrolled pain after using all opioid pills, don't hesitate to call our staff using the number provided. We will help make sure you are managing your pain in the best way possible, and if necessary, we can provide a prescription for additional pain medication.   Day 1    Time  Name of Medication Number of pills taken  Amount of Acetaminophen  Pain Level   Comments  AM PM       AM PM       AM PM       AM PM       AM PM  AM PM       AM PM       AM PM       Total Daily amount of Acetaminophen Do not take more than  3,000 mg per day      Day 2    Time  Name of Medication Number of pills taken  Amount of Acetaminophen  Pain Level   Comments  AM PM       AM PM       AM PM       AM PM       AM PM       AM PM       AM PM       AM PM       Total Daily amount of Acetaminophen Do not take more than  3,000 mg per day      Day 3    Time  Name of Medication Number of pills taken  Amount of Acetaminophen  Pain Level   Comments  AM PM       AM PM       AM PM       AM PM          AM PM       AM PM       AM PM       AM PM       Total Daily amount of Acetaminophen Do not take more than  3,000 mg per day      Day 4    Time  Name of Medication Number of pills taken  Amount of  Acetaminophen  Pain Level   Comments  AM PM       AM PM       AM PM       AM PM       AM PM       AM PM       AM PM       AM PM       Total Daily amount of Acetaminophen Do not take more than  3,000 mg per day      Day 5    Time  Name of Medication Number of pills taken  Amount of Acetaminophen  Pain Level   Comments  AM PM       AM PM       AM PM       AM PM       AM PM       AM PM       AM PM       AM PM       Total Daily amount of Acetaminophen Do not take more than  3,000 mg per day       Day 6    Time  Name of Medication Number of pills taken  Amount of Acetaminophen  Pain Level  Comments  AM PM       AM PM       AM PM       AM PM       AM PM       AM PM       AM PM       AM PM       Total Daily amount of Acetaminophen Do not take more than  3,000 mg per day      Day 7    Time  Name of Medication Number of pills taken  Amount of Acetaminophen  Pain Level   Comments  AM PM       AM PM       AM PM       AM PM       AM PM       AM PM       AM PM       AM PM       Total Daily amount of Acetaminophen Do not take more than  3,000 mg per day        For additional information about how and where to safely dispose of unused opioid medications - [email or web address]  Disclaimer: This document contains information and/or instructional materials adapted from [facility] for the typical patient with your condition. It does not replace medical advice from your health care provider because your experience may differ from that of the typical patient. Talk to your health care provider if you have any questions about this document, your condition or your treatment plan. Adapted from [facility]

## 2023-04-08 NOTE — Anesthesia Procedure Notes (Signed)
 Procedure Name: Intubation Date/Time: 04/08/2023 12:48 PM  Performed by: Darlina Guys, CRNAPre-anesthesia Checklist: Patient identified, Emergency Drugs available, Suction available and Patient being monitored Patient Re-evaluated:Patient Re-evaluated prior to induction Oxygen Delivery Method: Circle System Utilized Preoxygenation: Pre-oxygenation with 100% oxygen Induction Type: IV induction Ventilation: Mask ventilation without difficulty Laryngoscope Size: Mac and 4 Grade View: Grade II Tube type: Oral Tube size: 7.5 mm Number of attempts: 1 Airway Equipment and Method: Stylet Placement Confirmation: ETT inserted through vocal cords under direct vision, positive ETCO2 and breath sounds checked- equal and bilateral Secured at: 23 cm Tube secured with: Tape Dental Injury: Teeth and Oropharynx as per pre-operative assessment  Comments: Atraumatic induction/intubation. Dentition and oral mucosa as per preop.

## 2023-04-08 NOTE — TOC Initial Note (Addendum)
 Transition of Care (TOC) - Initial/Assessment Note   Spoke to patient at bedside. Confirmed face sheet information.   Patient does have a PCP at [facility], however he is unsure of the address and PCP name.   Per care everywhere : Salomon Mast, Cordelia Poche  [address]  [address]  [place], [place] [address]  [phone] (Work)  [phone] (Fax)   Patient lives with his aunt, uncle and nephew.   He states he does not have a legal guardian. His aunt is first to call if there is an emergency.     Plan laparoscopic appendectomy later today.     Patient Details  Name: Logan Washington MRN: [id number] Date of Birth: [birth year]  Transition of Care [facility] CM/SW Contact:    Kingsley Plan, RN Phone Number: 04/08/2023, 10:30 AM  Clinical Narrative:                   Expected Discharge Plan: Home/Self Care Barriers to Discharge: Continued Medical Work up   Patient Goals and CMS Choice Patient states their goals for this hospitalization and ongoing recovery are:: to return to home   Choice offered to / list presented to : NA      Expected Discharge Plan and Services   Discharge Planning Services: CM Consult Post Acute Care Choice: NA Living arrangements for the past 2 months: Single Family Home                 DME Arranged: N/A DME Agency: NA       HH Arranged: NA          Prior Living Arrangements/Services Living arrangements for the past 2 months: Single Family Home Lives with:: Relatives Patient language and need for interpreter reviewed:: Yes Do you feel safe going back to the place where you live?: Yes      Need for Family Participation in Patient Care: No (Comment) Care giver support system in place?: Yes (comment)   Criminal Activity/Legal Involvement Pertinent to Current Situation/Hospitalization: No - Comment as needed  Activities of Daily Living   ADL Screening (condition at time of admission) Independently performs ADLs?: Yes (appropriate for  developmental age) Is the patient deaf or have difficulty hearing?: No Does the patient have difficulty seeing, even when wearing glasses/contacts?: No Does the patient have difficulty concentrating, remembering, or making decisions?: No  Permission Sought/Granted   Permission granted to share information with : No              Emotional Assessment Appearance:: Appears stated age Attitude/Demeanor/Rapport: Engaged Affect (typically observed): Appropriate Orientation: : Oriented to Self, Oriented to Place, Oriented to  Time, Oriented to Situation Alcohol / Substance Use: Not Applicable Psych Involvement: No (comment)  Admission diagnosis:  Hyperemesis [R11.10] Appendicitis [K37] Acute appendicitis, unspecified acute appendicitis type [K35.80] COVID-19 [U07.1] Patient Active Problem List   Diagnosis Date Noted   Appendicitis 04/07/2023   Lab test positive for detection of COVID-19 virus 04/07/2023   Esophageal thickening 04/07/2023   Odynophagia 03/17/2021   Hypokalemia    DKA (diabetic ketoacidosis) (HCC) 03/15/2021   Dehydration 03/15/2021   Hematemesis 03/15/2021   Leukocytosis 03/15/2021   Hyperkalemia 03/15/2021   Influenza A 01/24/2018   Abdominal pain 01/24/2018   DKA, type 1 (HCC) 01/23/2018   AKI (acute kidney injury) (HCC) 01/23/2018   Abnormal LFTs 01/23/2018   Chest pain 01/23/2018   Diabetes mellitus without complication (HCC)    Sports physical 10/25/2015   PCP:  Patient, No Pcp  Per Pharmacy:   [id number] - [place], [place] - [address]OSS [address] [place] [address] Phone: [phone] Fax: [phone]     Social Drivers of Health (SDOH) Social History: SDOH Screenings   Food Insecurity: No Food Insecurity (04/08/2023)  Housing: Low Risk  (04/08/2023)  Transportation Needs: Unmet Transportation Needs (04/08/2023)  Utilities: Not At Risk (04/08/2023)  Social Connections: Unknown  (06/26/2021)   Received from [facility]  Tobacco Use: Low Risk  (04/07/2023)   SDOH Interventions: Transportation Interventions: Inpatient TOC   Readmission Risk Interventions     No data to display

## 2023-04-08 NOTE — Hospital Course (Signed)
 Logan Washington is a 23 y.o. male with past medical history significant for type 1 diabetes mellitus presented to hospital with abdominal pain, nausea and vomiting for 1 day.  In the ED patient was afebrile.  Labs showed hyperglycemia with glucose of 274, leukocytosis at 11.2.  Patient tested positive for COVID-19.  CT of the abdomen was concerning for early appendicitis.  Surgery was consulted and patient was admitted to hospital for further evaluation and treatment.   Assessment/Plan    Question appendicitis    General surgery was consulted. continue with bowel rest IV antibiotics and IV fluids.    Intractable N/V  - In setting of COVID, chronic marijuana use, DM, and possible appendicitis  Supportive care with antiemetics IV fluids.  Esophageal thickening; esophageal varices    - Esophageal thickening and small paraesophageal varices noted on CT in ED  Continue PPI.  Type I DM  -Hemoglobin A1c was 7.9% in April 2024 continue long-acting and sliding scale insulin  COVID infection. No respiratory symptoms.

## 2023-04-08 NOTE — Op Note (Signed)
 Appendectomy, Lap, Procedure Note  Indications: The patient presented with a history of right-sided abdominal pain. A CT scan revealed findings consistent with acute appendicitis.  Patient was also incidentally diagnosed with COVID.  Pre-operative Diagnosis: Acute appendicitis without mention of peritonitis  Post-operative Diagnosis: Same  Surgeon: Wynona Luna   Assistants: none  Anesthesia: General endotracheal anesthesia  ASA Class: 1  Procedure Details  The patient was seen again in the Holding Room. The risks, benefits, complications, treatment options, and expected outcomes were discussed with the patient and/or family. The possibilities of reaction to medication, perforation of viscus, bleeding, recurrent infection, finding a normal appendix, the need for additional procedures, failure to diagnose a condition, and creating a complication requiring transfusion or operation were discussed. There was concurrence with the proposed plan and informed consent was obtained. The site of surgery was properly noted. The patient was taken to Operating Room, identified as Zaine Elsass and the procedure verified as Appendectomy. A Time Out was held and the above information confirmed.  The patient was placed in the supine position and general anesthesia was induced.  The abdomen was prepped and draped in a sterile fashion. A one centimeter infraumbilical incision was made.  Dissection was carried down to the fascia bluntly.  The fascia was incised vertically.  We entered the peritoneal cavity bluntly.  A pursestring suture was passed around the fascial opening with a 0 Vicryl.  The Hasson cannula was introduced into the abdomen and the tails of the suture were used to hold the Hasson in place.   The pneumoperitoneum was then established maintaining a maximum pressure of 15 mmHg.  Additional 5 mm cannulas then placed in the left lower quadrant of the abdomen and the epigastrium under direct  visualization. A careful evaluation of the entire abdomen was carried out. The patient was placed in Trendelenburg and left lateral decubitus position.  The scope was moved to the right upper quadrant port site. The cecum was mobilized medially.  The appendix is inflamed, but there was no sign of perforation or abscess.  The appendix was carefully dissected. The appendix was skeletonized with the harmonic scalpel.   The appendix was divided at its base using an endo-GIA stapler. Minimal appendiceal stump was left in place. There was no evidence of bleeding, leakage, or complication after division of the appendix. Irrigation was also performed and irrigate suctioned from the abdomen as well.  The umbilical port site was closed with the purse string suture. There was no residual palpable fascial defect.  The trocar site skin wounds were closed with 4-0 Monocryl.  Instrument, sponge, and needle counts were correct at the conclusion of the case.   Findings: The appendix was found to be inflamed. There were not signs of necrosis.  There was not perforation. There was not abscess formation.  Estimated Blood Loss:  Minimal         Drains: none         Specimens: appendix         Complications:  None; patient tolerated the procedure well.         Disposition: PACU - hemodynamically stable.         Condition: stable  Wilmon Arms. Corliss Skains, MD, [facility]  General Surgery   04/08/2023 1:43 PM

## 2023-04-08 NOTE — Plan of Care (Signed)
  Problem: Education: Goal: Knowledge of risk factors and measures for prevention of condition will improve Outcome: Progressing   Problem: Coping: Goal: Psychosocial and spiritual needs will be supported Outcome: Progressing   Problem: Respiratory: Goal: Will maintain a patent airway Outcome: Progressing Goal: Complications related to the disease process, condition or treatment will be avoided or minimized Outcome: Progressing   Problem: Education: Goal: Knowledge of General Education information will improve Description: Including pain rating scale, medication(s)/side effects and non-pharmacologic comfort measures Outcome: Progressing   Problem: Health Behavior/Discharge Planning: Goal: Ability to manage health-related needs will improve Outcome: Progressing   Problem: Clinical Measurements: Goal: Ability to maintain clinical measurements within normal limits will improve Outcome: Progressing Goal: Will remain free from infection Outcome: Progressing Goal: Diagnostic test results will improve Outcome: Progressing Goal: Respiratory complications will improve Outcome: Progressing Goal: Cardiovascular complication will be avoided Outcome: Progressing   Problem: Activity: Goal: Risk for activity intolerance will decrease Outcome: Progressing   Problem: Nutrition: Goal: Adequate nutrition will be maintained Outcome: Progressing   Problem: Coping: Goal: Level of anxiety will decrease Outcome: Progressing   Problem: Elimination: Goal: Will not experience complications related to bowel motility Outcome: Progressing Goal: Will not experience complications related to urinary retention Outcome: Progressing   Problem: Pain Managment: Goal: General experience of comfort will improve and/or be controlled Outcome: Progressing   Problem: Safety: Goal: Ability to remain free from injury will improve Outcome: Progressing   Problem: Skin Integrity: Goal: Risk for impaired  skin integrity will decrease Outcome: Progressing   Problem: Education: Goal: Ability to describe self-care measures that may prevent or decrease complications (Diabetes Survival Skills Education) will improve Outcome: Progressing Goal: Individualized Educational Video(s) Outcome: Progressing   Problem: Coping: Goal: Ability to adjust to condition or change in health will improve Outcome: Progressing   Problem: Fluid Volume: Goal: Ability to maintain a balanced intake and output will improve Outcome: Progressing   Problem: Health Behavior/Discharge Planning: Goal: Ability to identify and utilize available resources and services will improve Outcome: Progressing Goal: Ability to manage health-related needs will improve Outcome: Progressing   Problem: Metabolic: Goal: Ability to maintain appropriate glucose levels will improve Outcome: Progressing   Problem: Nutritional: Goal: Maintenance of adequate nutrition will improve Outcome: Progressing Goal: Progress toward achieving an optimal weight will improve Outcome: Progressing   Problem: Skin Integrity: Goal: Risk for impaired skin integrity will decrease Outcome: Progressing   Problem: Tissue Perfusion: Goal: Adequacy of tissue perfusion will improve Outcome: Progressing

## 2023-04-08 NOTE — Progress Notes (Signed)
 Patient ID: Logan Washington, male   DOB: Oct 05, 2000, 23 y.o.   MRN: [id number]  Patient still with RLQ / periumbilical tenderness requiring pain medication WBC normal  Will plan laparoscopic appendectomy later today.  The surgical procedure has been discussed with the patient.  Potential risks, benefits, alternative treatments, and expected outcomes have been explained.  All of the patient's questions at this time have been answered.  The likelihood of reaching the patient's treatment goal is good.  The patient understand the proposed surgical procedure and wishes to proceed.  Possible discharge this afternoon or early tomorrow.  Wilmon Arms. Corliss Skains, MD, [facility]  General Surgery   04/08/2023 8:40 AM

## 2023-04-08 NOTE — Progress Notes (Signed)
 PROGRESS NOTE  Logan Washington ZOX:[id number] DOB: 2000/09/14 DOA: 04/07/2023 PCP: Patient, No Pcp Per   LOS: 1 day   Brief narrative:   Tymir Terral is a 23 y.o. male with past medical history significant for type 1 diabetes mellitus presented to hospital with abdominal pain, nausea and vomiting for 1 day.  In the ED patient was afebrile.  Labs showed hyperglycemia with glucose of 274, leukocytosis at 11.2.  Patient tested positive for COVID-19.  CT of the abdomen was concerning for early appendicitis.  General surgery was consulted and patient was admitted to hospital for further evaluation and treatment.   Assessment/Plan: Principal Problem:   Appendicitis Active Problems:   Diabetes mellitus without complication (HCC)   Lab test positive for detection of COVID-19 virus   Esophageal thickening   Acute appendicitis    General surgery was consulted. continue with bowel rest IV antibiotics and IV fluids.  Plan for appendectomy today.  Empirically on Rocephin and Flagyl.  No leukocytosis.    Intractable N/V  -Improved at this time.  Currently n.p.o. for surgical intervention.  In setting of COVID, chronic marijuana use, DM, and possible appendicitis  Supportive care with antiemetics IV fluids.  Esophageal thickening; esophageal varices    - Esophageal thickening and small paraesophageal varices noted on CT in ED. Continue PPI.  Type I DM  -Hemoglobin A1c was 7.9% in April 2024 continue long-acting and sliding scale insulin  COVID infection.  Incidental No respiratory symptoms.   DVT prophylaxis: SCDs Start: 04/07/23 2200   Disposition: Home likely 04/09/2023  Status is: Inpatient Remains inpatient appropriate because: Acute appendicitis, need for surgical intervention    Code Status:     Code Status: Full Code  Family Communication: None at bedside  Consultants: General surgery  Procedures: None yet  Anti-infectives:  Rocephin and Flagyl  Anti-infectives (From  admission, onward)    Start     Dose/Rate Route Frequency Ordered Stop   04/08/23 1800  cefTRIAXone (ROCEPHIN) 2 g in sodium chloride 0.9 % 100 mL IVPB        2 g 200 mL/hr over 30 Minutes Intravenous Every 24 hours 04/07/23 2204     04/08/23 1000  metroNIDAZOLE (FLAGYL) IVPB 500 mg        500 mg 100 mL/hr over 60 Minutes Intravenous Every 12 hours 04/07/23 2204     04/07/23 1745  cefTRIAXone (ROCEPHIN) 2 g in sodium chloride 0.9 % 100 mL IVPB       Placed in "And" Linked Group   2 g 200 mL/hr over 30 Minutes Intravenous  Once 04/07/23 1740 04/07/23 1840   04/07/23 1745  metroNIDAZOLE (FLAGYL) IVPB 500 mg       Placed in "And" Linked Group   500 mg 100 mL/hr over 60 Minutes Intravenous  Once 04/07/23 1740 04/07/23 2023        Subjective: Today, patient was seen and examined at bedside.  Patient denies any nausea, vomiting, fever, cough, congestion.  Complains of tenderness over the right lower quadrant.  Objective: Vitals:   04/07/23 2339 04/08/23 0400  BP: 131/65 (!) 120/59  Pulse:  91  Resp: 16 16  Temp: 98.6 F (37 C) 98.3 F (36.8 C)  SpO2: 99% 99%    Intake/Output Summary (Last 24 hours) at 04/08/2023 1029 Last data filed at 04/08/2023 0615 Gross per 24 hour  Intake 1852.08 ml  Output --  Net 1852.08 ml   Filed Weights   04/07/23 1401  Weight: 77.1 kg  Body mass index is 24.39 kg/m.   Physical Exam:  GENERAL: Patient is alert awake and oriented. Not in obvious distress. HENT: No scleral pallor or icterus. Pupils equally reactive to light. Oral mucosa is moist NECK: is supple, no gross swelling noted. CHEST: Clear to auscultation. No crackles or wheezes.   CVS: S1 and S2 heard, no murmur. Regular rate and rhythm.  ABDOMEN: Soft, tenderness of the right lower quadrant.  Rebound tenderness noted.  Guarding noted.  Bowel sounds are present. EXTREMITIES: No edema. CNS: Cranial nerves are intact. No focal motor deficits. SKIN: warm and dry without  rashes.  Data Review: I have personally reviewed the following laboratory data and studies,  CBC: Recent Labs  Lab 04/07/23 1407 04/08/23 0442  WBC 11.2* 7.8  HGB 15.6 14.4  HCT 45.7 40.9  MCV 84.5 84.9  PLT 342 335   Basic Metabolic Panel: Recent Labs  Lab 04/07/23 1407 04/08/23 0442  NA 136 135  K 4.9 3.5  CL 100 107  CO2 23 23  GLUCOSE 274* 185*  BUN 13 13  CREATININE 0.90 1.05  CALCIUM 9.2 8.3*  MG  --  1.6*   Liver Function Tests: Recent Labs  Lab 04/07/23 1407 04/08/23 0442  AST 22 14*  ALT 14 11  ALKPHOS 66 51  BILITOT 1.5* 1.5*  PROT 7.6 6.4*  ALBUMIN 4.2 3.4*   Recent Labs  Lab 04/07/23 1407  LIPASE 22   No results for input(s): "AMMONIA" in the last 168 hours. Cardiac Enzymes: No results for input(s): "CKTOTAL", "CKMB", "CKMBINDEX", "TROPONINI" in the last 168 hours. BNP (last 3 results) No results for input(s): "BNP" in the last 8760 hours.  ProBNP (last 3 results) No results for input(s): "PROBNP" in the last 8760 hours.  CBG: Recent Labs  Lab 04/07/23 2336 04/08/23 0441  GLUCAP 205* 193*   Recent Results (from the past 240 hours)  Resp panel by RT-PCR (RSV, Flu A&B, Covid) Anterior Nasal Swab     Status: Abnormal   Collection Time: 04/07/23  3:00 PM   Specimen: Anterior Nasal Swab  Result Value Ref Range Status   SARS Coronavirus 2 by RT PCR POSITIVE (A) NEGATIVE Final    Comment: (NOTE) SARS-CoV-2 target nucleic acids are DETECTED.  The SARS-CoV-2 RNA is generally detectable in upper respiratory specimens during the acute phase of infection. Positive results are indicative of the presence of the identified virus, but do not rule out bacterial infection or co-infection with other pathogens not detected by the test. Clinical correlation with patient history and other diagnostic information is necessary to determine patient infection status. The expected result is Negative.  Fact Sheet for  Patients: BloggerCourse.com  Fact Sheet for Healthcare Providers: SeriousBroker.it  This test is not yet approved or cleared by the [place] FDA and  has been authorized for detection and/or diagnosis of SARS-CoV-2 by FDA under an Emergency Use Authorization (EUA).  This EUA will remain in effect (meaning this test can be used) for the duration of  the COVID-19 declaration under Section 564(b)(1) of the A ct, 21 U.S.C. section 360bbb-3(b)(1), unless the authorization is terminated or revoked sooner.     Influenza A by PCR NEGATIVE NEGATIVE Final   Influenza B by PCR NEGATIVE NEGATIVE Final    Comment: (NOTE) The Xpert Xpress SARS-CoV-2/FLU/RSV plus assay is intended as an aid in the diagnosis of influenza from Nasopharyngeal swab specimens and should not be used as a sole basis for treatment. Nasal washings and aspirates  are unacceptable for Xpert Xpress SARS-CoV-2/FLU/RSV testing.  Fact Sheet for Patients: BloggerCourse.com  Fact Sheet for Healthcare Providers: SeriousBroker.it  This test is not yet approved or cleared by the [place] FDA and has been authorized for detection and/or diagnosis of SARS-CoV-2 by FDA under an Emergency Use Authorization (EUA). This EUA will remain in effect (meaning this test can be used) for the duration of the COVID-19 declaration under Section 564(b)(1) of the Act, 21 U.S.C. section 360bbb-3(b)(1), unless the authorization is terminated or revoked.     Resp Syncytial Virus by PCR NEGATIVE NEGATIVE Final    Comment: (NOTE) Fact Sheet for Patients: BloggerCourse.com  Fact Sheet for Healthcare Providers: SeriousBroker.it  This test is not yet approved or cleared by the [place] FDA and has been authorized for detection and/or diagnosis of SARS-CoV-2 by FDA under an Emergency Use  Authorization (EUA). This EUA will remain in effect (meaning this test can be used) for the duration of the COVID-19 declaration under Section 564(b)(1) of the Act, 21 U.S.C. section 360bbb-3(b)(1), unless the authorization is terminated or revoked.  Performed at [facility], [address] [place], [place] [address]      Studies: CT ABDOMEN PELVIS W CONTRAST Result Date: 04/07/2023 CLINICAL DATA:  Acute nonlocalized abdominal pain since this morning. EXAM: CT ABDOMEN AND PELVIS WITH CONTRAST TECHNIQUE: Multidetector CT imaging of the abdomen and pelvis was performed using the standard protocol following bolus administration of intravenous contrast. RADIATION DOSE REDUCTION: This exam was performed according to the departmental dose-optimization program which includes automated exposure control, adjustment of the mA and/or kV according to patient size and/or use of iterative reconstruction technique. CONTRAST:  OMNIPAQUE IOHEXOL 300 MG/ML  SOLN COMPARISON:  05/26/2022 FINDINGS: Lower chest: Lung bases are clear. Wall thickening around the distal esophagus could represent reflux disease or esophagitis. No specific mass lesion identified. Mild prominence of paraesophageal varices. Hepatobiliary: No focal liver abnormality is seen. No gallstones, gallbladder wall thickening, or biliary dilatation. Pancreas: Unremarkable. No pancreatic ductal dilatation or surrounding inflammatory changes. Spleen: Normal in size without focal abnormality. Adrenals/Urinary Tract: Adrenal glands are unremarkable. Kidneys are normal, without renal calculi, focal lesion, or hydronephrosis. Bladder is unremarkable. Stomach/Bowel: Stomach, small bowel, and colon are not abnormally distended. No wall thickening or inflammatory changes are seen. The appendix is mildly distended at 9.6 mm with mild infiltration around the periappendiceal fat and small appendicoliths present. This could indicate early changes of  acute appendicitis. No abscess or collection is identified. Mildly dilated terminal ileum could represent reactive inflammatory process. The appendix is located medially and inferior to the cecum. Vascular/Lymphatic: Normal caliber abdominal aorta. Scattered right lower quadrant lymph nodes are not pathologically enlarged, likely reactive. Reproductive: Prostate gland is not enlarged. Other: No free air or free fluid in the abdomen. Abdominal wall musculature appears intact. Musculoskeletal: No acute bony abnormalities. IMPRESSION: 1. Appendicoliths are present with mild appendicular dilatation and periappendicular stranding suggesting early acute appendicitis. No abscess or perforation is identified. 2. 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 Possible reflux disease or esophagitis. Electronically Signed   By: Burman Nieves M.D.   On: 04/07/2023 17:13      Joycelyn Das, MD  [facility] 04/08/2023  If 7PM-7AM, please contact night-coverage

## 2023-04-08 NOTE — Consult Note (Addendum)
 Logan Washington [birth year]  [id number].    Requesting MD: Gayla Medicus Chief Complaint/Reason for Consult: abdominal pain  HPI:  22 yo male with abdominal pain, nausea and vomiting. Pain is more present in the morning. He thought he just had a stomach bug. It is worse on the right side.  He had a runny nose and cough for the last week but did not think he had COVID.  ROS: Review of Systems  Constitutional:  Positive for malaise/fatigue.  HENT: Negative.    Eyes: Negative.   Respiratory:  Positive for cough.   Cardiovascular: Negative.   Gastrointestinal:  Positive for abdominal pain and nausea.  Genitourinary: Negative.   Musculoskeletal: Negative.   Skin: Negative.   Neurological: Negative.   Endo/Heme/Allergies: Negative.   Psychiatric/Behavioral: Negative.      Family History  Problem Relation Age of Onset   Breast cancer Mother    Lung cancer Mother    Colon cancer Maternal Grandmother    Sudden death Neg Hx    Heart attack Neg Hx    Stomach cancer Neg Hx    Pancreatic cancer Neg Hx    Esophageal cancer Neg Hx    Rectal cancer Neg Hx     Past Medical History:  Diagnosis Date   Diabetes (HCC)    Diabetes mellitus without complication (HCC)     Past Surgical History:  Procedure Laterality Date   ESOPHAGOGASTRODUODENOSCOPY (EGD) WITH PROPOFOL N/A 03/18/2021   Procedure: ESOPHAGOGASTRODUODENOSCOPY (EGD) WITH PROPOFOL;  Surgeon: Vida Rigger, MD;  Location: [facility];  Service: Endoscopy;  Laterality: N/A;   TONSILLECTOMY      Social History:  reports that he has never smoked. He has never used smokeless tobacco. He reports current drug use. Drug: Marijuana. He reports that he does not drink alcohol.  Allergies:  Allergies  Allergen Reactions   Tilapia [Fish Allergy] Swelling    Mouth     Medications Prior to Admission  Medication Sig Dispense Refill   B-D UF III MINI PEN NEEDLES 31G X 5 MM MISC FOR USE WITH INSULIN PENS  6   dicyclomine (BENTYL) 20  MG tablet Take 1 tablet (20 mg total) by mouth 4 (four) times daily -  before meals and at bedtime. (Patient not taking: Reported on 03/15/2021) 20 tablet 0   LANTUS SOLOSTAR 100 UNIT/ML Solostar Pen Inject 25 Units into the skin at bedtime. (Patient taking differently: Inject 31 Units into the skin at bedtime.) 15 mL 11   NOVOLOG FLEXPEN 100 UNIT/ML FlexPen Inject 6-7 Units into the skin 3 (three) times daily with meals. Every 7 carbs =1 unit  11   ondansetron (ZOFRAN) 4 MG tablet Take 1 tablet (4 mg total) by mouth every 6 (six) hours. 12 tablet 0   pantoprazole (PROTONIX) 40 MG tablet Take 1 tablet (40 mg total) by mouth daily. 30 tablet 2   sucralfate (CARAFATE) 1 GM/10ML suspension Take 10 mLs (1 g total) by mouth every 6 (six) hours for 7 days. 280 mL 1    Physical Exam: Blood pressure 131/65, pulse 87, temperature 98.6 F (37 C), temperature source Oral, resp. rate 16, height 5\' 10"  (1.778 m), weight 77.1 kg, SpO2 99%. Gen: NAD Resp: nonlabored CV: RRR Abd: soft, tender to palpation right side with guarding Neuro: OAx4  Results for orders placed or performed during the hospital encounter of 04/07/23 (from the past 48 hours)  Lipase, blood     Status: None   Collection Time: 04/07/23  2:07 PM  Result Value Ref Range   Lipase 22 11 - 51 U/L    Comment: Performed at [facility], [address] [place], [place] [address]  Comprehensive metabolic panel     Status: Abnormal   Collection Time: 04/07/23  2:07 PM  Result Value Ref Range   Sodium 136 135 - 145 mmol/L   Potassium 4.9 3.5 - 5.1 mmol/L   Chloride 100 98 - 111 mmol/L   CO2 23 22 - 32 mmol/L   Glucose, Bld 274 (H) 70 - 99 mg/dL    Comment: Glucose reference range applies only to samples taken after fasting for at least 8 hours.   BUN 13 6 - 20 mg/dL   Creatinine, Ser 1.47 0.61 - 1.24 mg/dL   Calcium 9.2 8.9 - 82.9 mg/dL   Total Protein 7.6 6.5 - 8.1 g/dL   Albumin 4.2 3.5 - 5.0 g/dL   AST 22 15 - 41 U/L   ALT  14 0 - 44 U/L   Alkaline Phosphatase 66 38 - 126 U/L   Total Bilirubin 1.5 (H) 0.0 - 1.2 mg/dL   GFR, Estimated >56 >21 mL/min    Comment: (NOTE) Calculated using the CKD-EPI Creatinine Equation (2021)    Anion gap 13 5 - 15    Comment: Performed at [facility], [address] [place], [place] [address]  CBC     Status: Abnormal   Collection Time: 04/07/23  2:07 PM  Result Value Ref Range   WBC 11.2 (H) 4.0 - 10.5 K/uL   RBC 5.41 4.22 - 5.81 MIL/uL   Hemoglobin 15.6 13.0 - 17.0 g/dL   HCT 78.4 69.6 - 29.5 %   MCV 84.5 80.0 - 100.0 fL   MCH 28.8 26.0 - 34.0 pg   MCHC 34.1 30.0 - 36.0 g/dL   RDW 28.4 13.2 - 44.0 %   Platelets 342 150 - 400 K/uL   nRBC 0.0 0.0 - 0.2 %    Comment: Performed at [facility], [address] [place], [place] [address]  Resp panel by RT-PCR (RSV, Flu A&B, Covid) Anterior Nasal Swab     Status: Abnormal   Collection Time: 04/07/23  3:00 PM   Specimen: Anterior Nasal Swab  Result Value Ref Range   SARS Coronavirus 2 by RT PCR POSITIVE (A) NEGATIVE    Comment: (NOTE) SARS-CoV-2 target nucleic acids are DETECTED.  The SARS-CoV-2 RNA is generally detectable in upper respiratory specimens during the acute phase of infection. Positive results are indicative of the presence of the identified virus, but do not rule out bacterial infection or co-infection with other pathogens not detected by the test. Clinical correlation with patient history and other diagnostic information is necessary to determine patient infection status. The expected result is Negative.  Fact Sheet for Patients: BloggerCourse.com  Fact Sheet for Healthcare Providers: SeriousBroker.it  This test is not yet approved or cleared by the [place] FDA and  has been authorized for detection and/or diagnosis of SARS-CoV-2 by FDA under an Emergency Use Authorization (EUA).  This EUA will remain in effect (meaning  this test can be used) for the duration of  the COVID-19 declaration under Section 564(b)(1) of the A ct, 21 U.S.C. section 360bbb-3(b)(1), unless the authorization is terminated or revoked sooner.     Influenza A by PCR NEGATIVE NEGATIVE   Influenza B by PCR NEGATIVE NEGATIVE    Comment: (NOTE) The Xpert Xpress SARS-CoV-2/FLU/RSV  plus assay is intended as an aid in the diagnosis of influenza from Nasopharyngeal swab specimens and should not be used as a sole basis for treatment. Nasal washings and aspirates are unacceptable for Xpert Xpress SARS-CoV-2/FLU/RSV testing.  Fact Sheet for Patients: BloggerCourse.com  Fact Sheet for Healthcare Providers: SeriousBroker.it  This test is not yet approved or cleared by the [place] FDA and has been authorized for detection and/or diagnosis of SARS-CoV-2 by FDA under an Emergency Use Authorization (EUA). This EUA will remain in effect (meaning this test can be used) for the duration of the COVID-19 declaration under Section 564(b)(1) of the Act, 21 U.S.C. section 360bbb-3(b)(1), unless the authorization is terminated or revoked.     Resp Syncytial Virus by PCR NEGATIVE NEGATIVE    Comment: (NOTE) Fact Sheet for Patients: BloggerCourse.com  Fact Sheet for Healthcare Providers: SeriousBroker.it  This test is not yet approved or cleared by the [place] FDA and has been authorized for detection and/or diagnosis of SARS-CoV-2 by FDA under an Emergency Use Authorization (EUA). This EUA will remain in effect (meaning this test can be used) for the duration of the COVID-19 declaration under Section 564(b)(1) of the Act, 21 U.S.C. section 360bbb-3(b)(1), unless the authorization is terminated or revoked.  Performed at [facility], [address] [place], [place] [address]   Urinalysis, Routine w reflex microscopic  -Urine, Clean Catch     Status: Abnormal   Collection Time: 04/07/23  4:33 PM  Result Value Ref Range   Color, Urine YELLOW YELLOW   APPearance CLEAR CLEAR   Specific Gravity, Urine 1.015 1.005 - 1.030   pH 6.5 5.0 - 8.0   Glucose, UA >=500 (A) NEGATIVE mg/dL   Hgb urine dipstick NEGATIVE NEGATIVE   Bilirubin Urine NEGATIVE NEGATIVE   Ketones, ur 80 (A) NEGATIVE mg/dL   Protein, ur NEGATIVE NEGATIVE mg/dL   Nitrite NEGATIVE NEGATIVE   Leukocytes,Ua NEGATIVE NEGATIVE    Comment: Performed at [facility], [address] [place], [place] [address]  Urinalysis, Microscopic (reflex)     Status: Abnormal   Collection Time: 04/07/23  4:33 PM  Result Value Ref Range   RBC / HPF NONE SEEN 0 - 5 RBC/hpf   WBC, UA NONE SEEN 0 - 5 WBC/hpf   Bacteria, UA RARE (A) NONE SEEN   Squamous Epithelial / HPF 0-5 0 - 5 /HPF    Comment: Performed at [facility], [address] [place], [place] [address]  Glucose, capillary     Status: Abnormal   Collection Time: 04/07/23 11:36 PM  Result Value Ref Range   Glucose-Capillary 205 (H) 70 - 99 mg/dL    Comment: Glucose reference range applies only to samples taken after fasting for at least 8 hours.   CT ABDOMEN PELVIS W CONTRAST Result Date: 04/07/2023 CLINICAL DATA:  Acute nonlocalized abdominal pain since this morning. EXAM: CT ABDOMEN AND PELVIS WITH CONTRAST TECHNIQUE: Multidetector CT imaging of the abdomen and pelvis was performed using the standard protocol following bolus administration of intravenous contrast. RADIATION DOSE REDUCTION: This exam was performed according to the departmental dose-optimization program which includes automated exposure control, adjustment of the mA and/or kV according to patient size and/or use of iterative reconstruction technique. CONTRAST:  OMNIPAQUE IOHEXOL 300 MG/ML  SOLN COMPARISON:  05/26/2022 FINDINGS: Lower chest: Lung bases are clear. Wall thickening around the distal esophagus could  represent reflux disease or esophagitis. No specific mass lesion identified.  Mild prominence of paraesophageal varices. Hepatobiliary: No focal liver abnormality is seen. No gallstones, gallbladder wall thickening, or biliary dilatation. Pancreas: Unremarkable. No pancreatic ductal dilatation or surrounding inflammatory changes. Spleen: Normal in size without focal abnormality. Adrenals/Urinary Tract: Adrenal glands are unremarkable. Kidneys are normal, without renal calculi, focal lesion, or hydronephrosis. Bladder is unremarkable. Stomach/Bowel: Stomach, small bowel, and colon are not abnormally distended. No wall thickening or inflammatory changes are seen. The appendix is mildly distended at 9.6 mm with mild infiltration around the periappendiceal fat and small appendicoliths present. This could indicate early changes of acute appendicitis. No abscess or collection is identified. Mildly dilated terminal ileum could represent reactive inflammatory process. The appendix is located medially and inferior to the cecum. Vascular/Lymphatic: Normal caliber abdominal aorta. Scattered right lower quadrant lymph nodes are not pathologically enlarged, likely reactive. Reproductive: Prostate gland is not enlarged. Other: No free air or free fluid in the abdomen. Abdominal wall musculature appears intact. Musculoskeletal: No acute bony abnormalities. IMPRESSION: 1. Appendicoliths are present with mild appendicular dilatation and periappendicular stranding suggesting early acute appendicitis. No abscess or perforation is identified. 2. 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 Possible reflux disease or esophagitis. Electronically Signed   By: Burman Nieves M.D.   On: 04/07/2023 17:13    Assessment/Plan 23 yo male with acute appendicitis and COVID. White count is borderline, appendicoliths present on CT scan, guarding on exam though minimal baseline pain complaint -IV abx -will  discuss with daytime surgeon for final decision given COVID as complicating factor   FEN - clear liquids, NPO 2 am VTE - lovenox ID - ceftriaxone/flagyl Admit - admitted to hospitalist service  I reviewed last 24 h vitals and pain scores, last 48 h intake and output, last 24 h labs and trends, and last 24 h imaging results.  De Blanch [facility] 04/08/2023, 12:07 AM Please see Amion for pager number during day hours 7:00am-4:30pm or 7:00am -11:30am on weekends

## 2023-04-08 NOTE — Plan of Care (Signed)
 Problem: Education: Goal: Knowledge of risk factors and measures for prevention of condition will improve 04/08/2023 1630 by Letta Moynahan, RN Outcome: Adequate for Discharge 04/08/2023 1111 by Letta Moynahan, RN Outcome: Progressing   Problem: Coping: Goal: Psychosocial and spiritual needs will be supported 04/08/2023 1630 by Letta Moynahan, RN Outcome: Adequate for Discharge 04/08/2023 1111 by Letta Moynahan, RN Outcome: Progressing   Problem: Respiratory: Goal: Will maintain a patent airway 04/08/2023 1630 by Letta Moynahan, RN Outcome: Adequate for Discharge 04/08/2023 1111 by Letta Moynahan, RN Outcome: Progressing Goal: Complications related to the disease process, condition or treatment will be avoided or minimized 04/08/2023 1630 by Letta Moynahan, RN Outcome: Adequate for Discharge 04/08/2023 1111 by Letta Moynahan, RN Outcome: Progressing   Problem: Education: Goal: Knowledge of General Education information will improve Description: Including pain rating scale, medication(s)/side effects and non-pharmacologic comfort measures 04/08/2023 1630 by Letta Moynahan, RN Outcome: Adequate for Discharge 04/08/2023 1111 by Letta Moynahan, RN Outcome: Progressing   Problem: Health Behavior/Discharge Planning: Goal: Ability to manage health-related needs will improve 04/08/2023 1630 by Letta Moynahan, RN Outcome: Adequate for Discharge 04/08/2023 1111 by Letta Moynahan, RN Outcome: Progressing   Problem: Clinical Measurements: Goal: Ability to maintain clinical measurements within normal limits will improve 04/08/2023 1630 by Letta Moynahan, RN Outcome: Adequate for Discharge 04/08/2023 1111 by Letta Moynahan, RN Outcome: Progressing Goal: Will remain free from infection 04/08/2023 1630 by Letta Moynahan, RN Outcome: Adequate for Discharge 04/08/2023 1111 by Letta Moynahan, RN Outcome: Progressing Goal: Diagnostic test results will improve 04/08/2023 1630 by Letta Moynahan,  RN Outcome: Adequate for Discharge 04/08/2023 1111 by Letta Moynahan, RN Outcome: Progressing Goal: Respiratory complications will improve 04/08/2023 1630 by Letta Moynahan, RN Outcome: Adequate for Discharge 04/08/2023 1111 by Letta Moynahan, RN Outcome: Progressing Goal: Cardiovascular complication will be avoided 04/08/2023 1630 by Letta Moynahan, RN Outcome: Adequate for Discharge 04/08/2023 1111 by Letta Moynahan, RN Outcome: Progressing   Problem: Activity: Goal: Risk for activity intolerance will decrease 04/08/2023 1630 by Letta Moynahan, RN Outcome: Adequate for Discharge 04/08/2023 1111 by Letta Moynahan, RN Outcome: Progressing   Problem: Nutrition: Goal: Adequate nutrition will be maintained 04/08/2023 1630 by Letta Moynahan, RN Outcome: Adequate for Discharge 04/08/2023 1111 by Letta Moynahan, RN Outcome: Progressing   Problem: Coping: Goal: Level of anxiety will decrease 04/08/2023 1630 by Letta Moynahan, RN Outcome: Adequate for Discharge 04/08/2023 1111 by Letta Moynahan, RN Outcome: Progressing   Problem: Elimination: Goal: Will not experience complications related to bowel motility 04/08/2023 1630 by Letta Moynahan, RN Outcome: Adequate for Discharge 04/08/2023 1111 by Letta Moynahan, RN Outcome: Progressing Goal: Will not experience complications related to urinary retention 04/08/2023 1630 by Letta Moynahan, RN Outcome: Adequate for Discharge 04/08/2023 1111 by Letta Moynahan, RN Outcome: Progressing   Problem: Pain Managment: Goal: General experience of comfort will improve and/or be controlled 04/08/2023 1630 by Letta Moynahan, RN Outcome: Adequate for Discharge 04/08/2023 1111 by Letta Moynahan, RN Outcome: Progressing   Problem: Safety: Goal: Ability to remain free from injury will improve 04/08/2023 1630 by Letta Moynahan, RN Outcome: Adequate for Discharge 04/08/2023 1111 by Letta Moynahan, RN Outcome: Progressing   Problem: Skin Integrity: Goal:  Risk for impaired skin integrity will decrease 04/08/2023 1630 by Letta Moynahan, RN Outcome: Adequate for Discharge 04/08/2023 1111 by Margaretmary Dys  L, RN Outcome: Progressing   Problem: Education: Goal: Ability to describe self-care measures that may prevent or decrease complications (Diabetes Survival Skills Education) will improve 04/08/2023 1630 by Letta Moynahan, RN Outcome: Adequate for Discharge 04/08/2023 1111 by Letta Moynahan, RN Outcome: Progressing Goal: Individualized Educational Video(s) 04/08/2023 1630 by Letta Moynahan, RN Outcome: Adequate for Discharge 04/08/2023 1111 by Letta Moynahan, RN Outcome: Progressing   Problem: Coping: Goal: Ability to adjust to condition or change in health will improve 04/08/2023 1630 by Letta Moynahan, RN Outcome: Adequate for Discharge 04/08/2023 1111 by Letta Moynahan, RN Outcome: Progressing   Problem: Fluid Volume: Goal: Ability to maintain a balanced intake and output will improve 04/08/2023 1630 by Letta Moynahan, RN Outcome: Adequate for Discharge 04/08/2023 1111 by Letta Moynahan, RN Outcome: Progressing   Problem: Health Behavior/Discharge Planning: Goal: Ability to identify and utilize available resources and services will improve 04/08/2023 1630 by Letta Moynahan, RN Outcome: Adequate for Discharge 04/08/2023 1111 by Letta Moynahan, RN Outcome: Progressing Goal: Ability to manage health-related needs will improve 04/08/2023 1630 by Letta Moynahan, RN Outcome: Adequate for Discharge 04/08/2023 1111 by Letta Moynahan, RN Outcome: Progressing   Problem: Metabolic: Goal: Ability to maintain appropriate glucose levels will improve 04/08/2023 1630 by Letta Moynahan, RN Outcome: Adequate for Discharge 04/08/2023 1111 by Letta Moynahan, RN Outcome: Progressing   Problem: Nutritional: Goal: Maintenance of adequate nutrition will improve 04/08/2023 1630 by Letta Moynahan, RN Outcome: Adequate for Discharge 04/08/2023 1111 by Letta Moynahan, RN Outcome: Progressing Goal: Progress toward achieving an optimal weight will improve 04/08/2023 1630 by Letta Moynahan, RN Outcome: Adequate for Discharge 04/08/2023 1111 by Letta Moynahan, RN Outcome: Progressing   Problem: Skin Integrity: Goal: Risk for impaired skin integrity will decrease 04/08/2023 1630 by Letta Moynahan, RN Outcome: Adequate for Discharge 04/08/2023 1111 by Letta Moynahan, RN Outcome: Progressing   Problem: Tissue Perfusion: Goal: Adequacy of tissue perfusion will improve 04/08/2023 1630 by Letta Moynahan, RN Outcome: Adequate for Discharge 04/08/2023 1111 by Letta Moynahan, RN Outcome: Progressing

## 2023-04-09 ENCOUNTER — Encounter (HOSPITAL_COMMUNITY): Payer: Self-pay | Admitting: Surgery

## 2023-04-09 LAB — HEMOGLOBIN A1C
Hgb A1c MFr Bld: 7.3 % — ABNORMAL HIGH (ref 4.8–5.6)
Mean Plasma Glucose: 163 mg/dL

## 2023-04-09 NOTE — Anesthesia Postprocedure Evaluation (Signed)
 Anesthesia Post Note  Patient: Logan Washington  Procedure(s) Performed: APPENDECTOMY LAPAROSCOPIC (Abdomen)     Patient location during evaluation: PACU Anesthesia Type: General Level of consciousness: sedated and patient cooperative Pain management: pain level controlled Vital Signs Assessment: post-procedure vital signs reviewed and stable Respiratory status: spontaneous breathing Cardiovascular status: stable Anesthetic complications: no   No notable events documented.  Last Vitals:  Vitals:   04/08/23 1430 04/08/23 1626  BP:  118/80  Pulse:  62  Resp:  17  Temp: 36.6 C 36.9 C  SpO2:  100%    Last Pain:  Vitals:   04/08/23 1626  TempSrc: Oral  PainSc:                  [name]

## 2023-04-10 LAB — SURGICAL PATHOLOGY

## 2023-04-16 NOTE — Discharge Summary (Signed)
 [facility] Discharge Summary   Patient ID: Logan Washington MRN: [id number] DOB/AGE: [birth year] 23 y.o.  Admit date: 04/07/2023 Discharge date: 04/08/23  Admitting Diagnosis: Hyperemesis [R11.10] Appendicitis [K37] Acute appendicitis, unspecified acute appendicitis type [K35.80] COVID-19 [U07.1] ***  Discharge Diagnosis ***  Consultants *** Imaging: No results found.  Procedures Dr. Marland Kitchen (***/***/24) - Laparoscopic Cholecystectomy with IOC Dr. Marland Kitchen (***/***/24) - Laparoscopic Appendectomy  Hospital Course:  23 y.o. male who presented to *** with ***.  Workup showed ***.  Patient was admitted and underwent procedure listed above.  Tolerated procedure well and was transferred to the floor.  Diet was advanced as tolerated.  On POD***, the patient was voiding well, tolerating diet, ambulating well, pain well controlled, vital signs stable, incisions c/d/i and felt stable for discharge home.  Patient will follow up in our office in 3 weeks and knows to call with questions or concerns.  He/She*** will call to confirm appointment date/time.    Physical Exam: General:  Alert, NAD, pleasant, comfortable Abd:  Soft, ND, mild tenderness, incisions C/D/I, drain with minimal sanguinous drainage ***   I or a member of my team have reviewed this patient in the Controlled Substance Database.*** ***I was not directly involved in this patient's care therefore the information in this discharge summary was taken from the chart.   Allergies as of 04/08/2023       Reactions   Tilapia [fish Allergy] Swelling   Mouth         Medication List     TAKE these medications    acetaminophen 500 MG tablet Commonly known as: TYLENOL Take 2 tablets (1,000 mg total) by mouth every 6 (six) hours as needed for mild pain (pain score 1-3) or moderate pain (pain score 4-6) (or Fever >/= 101).   dicyclomine 20 MG tablet Commonly known as: BENTYL Take 1 tablet (20 mg total) by mouth 4 (four)  times daily -  before meals and at bedtime.   docusate sodium 100 MG capsule Commonly known as: Colace Take 1 capsule (100 mg total) by mouth 2 (two) times daily as needed for mild constipation.   Lantus SoloStar 100 UNIT/ML Solostar Pen Generic drug: insulin glargine Inject 25 Units into the skin at bedtime. What changed: how much to take   NovoLOG FlexPen 100 UNIT/ML FlexPen Generic drug: insulin aspart Inject 0.5-12 Units into the skin 3 (three) times daily with meals. Every 7 carbs =1 unit       ASK your doctor about these medications    oxyCODONE 5 MG immediate release tablet Commonly known as: Oxy IR/ROXICODONE Take 1 tablet (5 mg total) by mouth every 6 (six) hours as needed for up to 5 days for moderate pain (pain score 4-6) or severe pain (pain score 7-10). Ask about: Should I take this medication?          Follow-up Information     Medicaid transportation Follow up.   Contact information: [phone]        Maczis, Puja Gosai, PA-C. Go on 05/06/2023.   Specialty: General Surgery Why: 3/25 at 11:15 am, Please arrive 30 minutes early to complete check in, and bring photo ID and insurance card. Contact information: [address] [facility] [place] [address] [phone]         Salomon Mast, PA-C. Call.   Specialty: Family Medicine Why: As needed after your admission Contact information: [address] [place] [address] [phone]  Signed: Eric Form , [facility] 04/16/2023, 3:48 PM Please see Amion for pager number during day hours 7:00am-4:30pm

## 2023-05-06 ENCOUNTER — Other Ambulatory Visit: Payer: Self-pay | Admitting: Student

## 2023-05-06 DIAGNOSIS — R0602 Shortness of breath: Secondary | ICD-10-CM

## 2023-05-06 DIAGNOSIS — Z9049 Acquired absence of other specified parts of digestive tract: Secondary | ICD-10-CM

## 2023-05-08 ENCOUNTER — Ambulatory Visit
Admission: RE | Admit: 2023-05-08 | Discharge: 2023-05-08 | Discharge status: Home / Self Care | Source: Ambulatory Visit | Attending: Student

## 2023-05-08 DIAGNOSIS — R0602 Shortness of breath: Secondary | ICD-10-CM

## 2023-05-08 DIAGNOSIS — Z9049 Acquired absence of other specified parts of digestive tract: Secondary | ICD-10-CM

## 2023-05-08 MED ORDER — IOPAMIDOL (ISOVUE-370) INJECTION 76%
75.0000 mL | Freq: Once | INTRAVENOUS | Status: AC | PRN
  Administered 2023-05-08: 75 mL via INTRAVENOUS

## 2023-06-12 ENCOUNTER — Encounter (HOSPITAL_COMMUNITY): Payer: Self-pay

## 2023-06-12 ENCOUNTER — Other Ambulatory Visit: Payer: Self-pay

## 2023-06-12 ENCOUNTER — Emergency Department (HOSPITAL_COMMUNITY)

## 2023-06-12 ENCOUNTER — Observation Stay (HOSPITAL_COMMUNITY)
Admission: EM | Admit: 2023-06-12 | Discharge: 2023-06-13 | Disposition: A | Discharge status: Home / Self Care | Attending: Internal Medicine | Admitting: Internal Medicine

## 2023-06-12 DIAGNOSIS — E8809 Other disorders of plasma-protein metabolism, not elsewhere classified: Secondary | ICD-10-CM | POA: Diagnosis not present

## 2023-06-12 DIAGNOSIS — E119 Type 2 diabetes mellitus without complications: Secondary | ICD-10-CM

## 2023-06-12 DIAGNOSIS — E109 Type 1 diabetes mellitus without complications: Secondary | ICD-10-CM | POA: Diagnosis not present

## 2023-06-12 DIAGNOSIS — N179 Acute kidney failure, unspecified: Secondary | ICD-10-CM | POA: Diagnosis not present

## 2023-06-12 DIAGNOSIS — E860 Dehydration: Secondary | ICD-10-CM | POA: Diagnosis not present

## 2023-06-12 DIAGNOSIS — D72829 Elevated white blood cell count, unspecified: Secondary | ICD-10-CM | POA: Diagnosis not present

## 2023-06-12 DIAGNOSIS — Z79899 Other long term (current) drug therapy: Secondary | ICD-10-CM | POA: Insufficient documentation

## 2023-06-12 DIAGNOSIS — F1290 Cannabis use, unspecified, uncomplicated: Secondary | ICD-10-CM | POA: Insufficient documentation

## 2023-06-12 DIAGNOSIS — R112 Nausea with vomiting, unspecified: Secondary | ICD-10-CM | POA: Diagnosis not present

## 2023-06-12 DIAGNOSIS — F17210 Nicotine dependence, cigarettes, uncomplicated: Secondary | ICD-10-CM | POA: Insufficient documentation

## 2023-06-12 DIAGNOSIS — R109 Unspecified abdominal pain: Secondary | ICD-10-CM | POA: Diagnosis present

## 2023-06-12 DIAGNOSIS — R55 Syncope and collapse: Secondary | ICD-10-CM | POA: Diagnosis not present

## 2023-06-12 LAB — CBG MONITORING, ED: Glucose-Capillary: 264 mg/dL — ABNORMAL HIGH (ref 70–99)

## 2023-06-12 LAB — BASIC METABOLIC PANEL WITH GFR
Anion gap: 12 (ref 5–15)
BUN: 12 mg/dL (ref 6–20)
CO2: 25 mmol/L (ref 22–32)
Calcium: 9.3 mg/dL (ref 8.9–10.3)
Chloride: 100 mmol/L (ref 98–111)
Creatinine, Ser: 0.99 mg/dL (ref 0.61–1.24)
GFR, Estimated: >60 mL/min (ref >60)
Glucose, Bld: 251 mg/dL — ABNORMAL HIGH (ref 70–99)
Potassium: 4.4 mmol/L (ref 3.5–5.1)
Sodium: 137 mmol/L (ref 135–145)

## 2023-06-12 LAB — I-STAT VENOUS BLOOD GAS, ED
Acid-base deficit: 2 mmol/L (ref 0.0–2.0)
Bicarbonate: 24.2 mmol/L (ref 20.0–28.0)
Calcium, Ion: 1.07 mmol/L — ABNORMAL LOW (ref 1.15–1.40)
HCT: 47 % (ref 39.0–52.0)
Hemoglobin: 16 g/dL (ref 13.0–17.0)
O2 Saturation: 94 %
Potassium: 4.9 mmol/L (ref 3.5–5.1)
Sodium: 136 mmol/L (ref 135–145)
TCO2: 26 mmol/L (ref 22–32)
pCO2, Ven: 45.8 mmHg (ref 44–60)
pH, Ven: 7.331 (ref 7.25–7.43)
pO2, Ven: 76 mmHg — ABNORMAL HIGH (ref 32–45)

## 2023-06-12 LAB — CBC WITH DIFFERENTIAL/PLATELET
Abs Immature Granulocytes: 0.05 10*3/uL (ref 0.00–0.07)
Basophils Absolute: 0 10*3/uL (ref 0.0–0.1)
Basophils Relative: 0 %
Eosinophils Absolute: 0 10*3/uL (ref 0.0–0.5)
Eosinophils Relative: 0 %
HCT: 46.5 % (ref 39.0–52.0)
Hemoglobin: 16.2 g/dL (ref 13.0–17.0)
Immature Granulocytes: 0 %
Lymphocytes Relative: 11 %
Lymphs Abs: 1.4 10*3/uL (ref 0.7–4.0)
MCH: 29.7 pg (ref 26.0–34.0)
MCHC: 34.8 g/dL (ref 30.0–36.0)
MCV: 85.2 fL (ref 80.0–100.0)
Monocytes Absolute: 0.5 10*3/uL (ref 0.1–1.0)
Monocytes Relative: 4 %
Neutro Abs: 10.5 10*3/uL — ABNORMAL HIGH (ref 1.7–7.7)
Neutrophils Relative %: 85 %
Platelets: 328 10*3/uL (ref 150–400)
RBC: 5.46 MIL/uL (ref 4.22–5.81)
RDW: 12.5 % (ref 11.5–15.5)
WBC: 12.5 10*3/uL — ABNORMAL HIGH (ref 4.0–10.5)
nRBC: 0 % (ref 0.0–0.2)

## 2023-06-12 LAB — COMPREHENSIVE METABOLIC PANEL WITH GFR
ALT: 21 U/L (ref 0–44)
AST: 25 U/L (ref 15–41)
Albumin: 3.9 g/dL (ref 3.5–5.0)
Alkaline Phosphatase: 62 U/L (ref 38–126)
Anion gap: 16 — ABNORMAL HIGH (ref 5–15)
BUN: 13 mg/dL (ref 6–20)
CO2: 20 mmol/L — ABNORMAL LOW (ref 22–32)
Calcium: 9.2 mg/dL (ref 8.9–10.3)
Chloride: 101 mmol/L (ref 98–111)
Creatinine, Ser: 0.96 mg/dL (ref 0.61–1.24)
GFR, Estimated: >60 mL/min (ref >60)
Glucose, Bld: 278 mg/dL — ABNORMAL HIGH (ref 70–99)
Potassium: 4.4 mmol/L (ref 3.5–5.1)
Sodium: 137 mmol/L (ref 135–145)
Total Bilirubin: 1.5 mg/dL — ABNORMAL HIGH (ref 0.0–1.2)
Total Protein: 6.8 g/dL (ref 6.5–8.1)

## 2023-06-12 LAB — ETHANOL: Alcohol, Ethyl (B): <15 mg/dL (ref <15)

## 2023-06-12 LAB — LIPASE, BLOOD: Lipase: 24 U/L (ref 11–51)

## 2023-06-12 LAB — BETA-HYDROXYBUTYRIC ACID: Beta-Hydroxybutyric Acid: 0.67 mmol/L — ABNORMAL HIGH (ref 0.05–0.27)

## 2023-06-12 LAB — GLUCOSE, CAPILLARY: Glucose-Capillary: 231 mg/dL — ABNORMAL HIGH (ref 70–99)

## 2023-06-12 LAB — TROPONIN I (HIGH SENSITIVITY)
Troponin I (High Sensitivity): <2 ng/L (ref <18)
Troponin I (High Sensitivity): <2 ng/L (ref <18)

## 2023-06-12 MED ORDER — INSULIN ASPART 100 UNIT/ML IJ SOLN
0.0000 [IU] | Freq: Every day | INTRAMUSCULAR | Status: DC
  Administered 2023-06-12: 2 [IU] via SUBCUTANEOUS

## 2023-06-12 MED ORDER — LACTATED RINGERS IV SOLN: INTRAVENOUS | Status: DC

## 2023-06-12 MED ORDER — MORPHINE SULFATE (PF) 2 MG/ML IV SOLN
2.0000 mg | INTRAVENOUS | Status: DC | PRN
  Filled 2023-06-12: qty 1

## 2023-06-12 MED ORDER — MORPHINE SULFATE (PF) 4 MG/ML IV SOLN
4.0000 mg | Freq: Once | INTRAVENOUS | Status: AC
  Administered 2023-06-12: 4 mg via INTRAVENOUS
  Filled 2023-06-12: qty 1

## 2023-06-12 MED ORDER — ONDANSETRON HCL 4 MG/2ML IJ SOLN: 4.0000 mg | Freq: Four times a day (QID) | INTRAMUSCULAR | Status: DC | PRN

## 2023-06-12 MED ORDER — INSULIN ASPART 100 UNIT/ML IJ SOLN
0.0000 [IU] | Freq: Three times a day (TID) | INTRAMUSCULAR | Status: DC
  Administered 2023-06-13 (×2): 4 [IU] via SUBCUTANEOUS

## 2023-06-12 MED ORDER — DROPERIDOL 2.5 MG/ML IJ SOLN
1.2500 mg | Freq: Once | INTRAMUSCULAR | Status: AC
  Administered 2023-06-12: 1.25 mg via INTRAVENOUS
  Filled 2023-06-12 (×2): qty 2

## 2023-06-12 MED ORDER — ONDANSETRON HCL 4 MG/2ML IJ SOLN
4.0000 mg | Freq: Once | INTRAMUSCULAR | Status: AC
  Administered 2023-06-12: 4 mg via INTRAVENOUS
  Filled 2023-06-12: qty 2

## 2023-06-12 MED ORDER — LACTATED RINGERS IV BOLUS
1000.0000 mL | Freq: Once | INTRAVENOUS | Status: AC
  Administered 2023-06-12: 1000 mL via INTRAVENOUS

## 2023-06-12 MED ORDER — ENOXAPARIN SODIUM 40 MG/0.4ML IJ SOSY
40.0000 mg | PREFILLED_SYRINGE | INTRAMUSCULAR | Status: DC
  Administered 2023-06-12: 40 mg via SUBCUTANEOUS
  Filled 2023-06-12: qty 0.4

## 2023-06-12 MED ORDER — ONDANSETRON HCL 4 MG PO TABS
4.0000 mg | ORAL_TABLET | Freq: Four times a day (QID) | ORAL | Status: DC | PRN
  Filled 2023-06-12: qty 1

## 2023-06-12 MED ORDER — IOHEXOL 350 MG/ML SOLN
75.0000 mL | Freq: Once | INTRAVENOUS | Status: AC | PRN
  Administered 2023-06-12: 75 mL via INTRAVENOUS

## 2023-06-12 MED ORDER — PANTOPRAZOLE SODIUM 40 MG IV SOLR
40.0000 mg | Freq: Once | INTRAVENOUS | Status: AC
  Administered 2023-06-12: 40 mg via INTRAVENOUS
  Filled 2023-06-12: qty 10

## 2023-06-12 MED ORDER — SODIUM CHLORIDE 0.9 % IV SOLN
25.0000 mg | Freq: Once | INTRAVENOUS | Status: AC
  Administered 2023-06-12: 25 mg via INTRAVENOUS
  Filled 2023-06-12: qty 1

## 2023-06-12 NOTE — ED Notes (Addendum)
 Spoke with uncle in length, and gave detailed update on pt status. Pt's uncle confirmed pt's extensive use of cannabis use. Will relay to Dr. Linder Revere

## 2023-06-12 NOTE — ED Notes (Signed)
 New labs sent due per main lab tech request

## 2023-06-12 NOTE — Care Management (Signed)
 Someone was transferred to the [facility] line left a message but did not leave their name or number they wanted a update on the patient. Notified his nurse. Aaron Aas

## 2023-06-12 NOTE — ED Triage Notes (Addendum)
 Pt had witnessed 2 seizures. 1 was 10 seconds and other 2 minutes. hx of type 1 diabetes. Pt did not eat eat. Hx of seizures. C/O abd pain. N/v. Bilateral leg cramping. Global ST elevation on EKG. EMS have 1L of NS

## 2023-06-12 NOTE — ED Notes (Signed)
 Pt states no seizure history, however RN on scene stated seizure history, but no seizure medication listed.  EMS gave 4mg  zofran , 1 L normal saline. Pt c/o abdominal pain, and refusing to answer questions.  Cardiology RN at bedside and stated EKG back to baseline.  249 bgl per EMS

## 2023-06-12 NOTE — ED Notes (Signed)
 Pt up and ambulated to bathroom, without given sample. Pt was made aware piror of need for urine sample.

## 2023-06-12 NOTE — ED Provider Notes (Signed)
  EMERGENCY DEPARTMENT AT [facility] Provider Note   CSN: [id number] Arrival date & time: 06/12/23  1130     History  Chief Complaint  Patient presents with   Abdominal Pain    Logan Washington is a 23 y.o. male.   Abdominal Pain    Patient has a history of DKA, chest pain, abdominal pain.  Patient presents to the ED for evaluation of possible syncopal versus seizure episode.  Patient states he started having abdominal pain this morning associated with nausea and vomiting.  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  While he was getting evaluated patient had an episode where he passed out.  The nurse who was evaluating him felt that he had a seizure.  He had an episode that lasted approximately 10 seconds and another 1 that lasted for couple of minutes.  Patient states he has been having abdominal pain, vomiting and leg cramping.  He states his blood sugars have been running a little bit high.  Patient states it hurts for him to talk because his abdomen starts hurting.  He denies any prior history of seizures.  Home Medications Prior to Admission medications   Medication Sig Start Date End Date Taking? Authorizing Provider  acetaminophen  (TYLENOL ) 500 MG tablet Take 2 tablets (1,000 mg total) by mouth every 6 (six) hours as needed for mild pain (pain score 1-3) or moderate pain (pain score 4-6) (or Fever >/= 101). 04/08/23  Yes Elwin Hammond, PA-C  LANTUS  SOLOSTAR 100 UNIT/ML Solostar Pen Inject 25 Units into the skin at bedtime. Patient taking differently: Inject 31 Units into the skin at bedtime. 01/25/18  Yes Maylene Spear, MD  NOVOLOG  FLEXPEN 100 UNIT/ML FlexPen Inject 0.5-12 Units into the skin 3 (three) times daily with meals. Sliding scale , Every 7 carbs =1 unit per patient 10/09/15  Yes [provider]      Allergies    Tilapia [fish allergy]    Review of Systems   Review of Systems  Gastrointestinal:  Positive for abdominal pain.    Physical  Exam Updated Vital Signs BP (!) 159/96   Pulse 73   Temp 97.8 F (36.6 C) (Oral)   Ht 1.803 m (5\' 11" )   Wt 75.8 kg   SpO2 100%   BMI 23.29 kg/m  Physical Exam Vitals and nursing note reviewed.  Constitutional:      Appearance: He is well-developed. He is ill-appearing.  HENT:     Head: Normocephalic and atraumatic.     Right Ear: External ear normal.     Left Ear: External ear normal.  Eyes:     General: No scleral icterus.       Right eye: No discharge.        Left eye: No discharge.     Conjunctiva/sclera: Conjunctivae normal.  Neck:     Trachea: No tracheal deviation.  Cardiovascular:     Rate and Rhythm: Normal rate and regular rhythm.  Pulmonary:     Effort: Pulmonary effort is normal. No respiratory distress.     Breath sounds: Normal breath sounds. No stridor. No wheezing or rales.  Abdominal:     General: Bowel sounds are normal. There is no distension.     Palpations: Abdomen is soft.     Tenderness: There is generalized abdominal tenderness. There is no guarding or rebound.  Musculoskeletal:        General: No tenderness or deformity.     Cervical back: Neck supple.  Skin:    General: Skin is warm and dry.     Findings: No rash.  Neurological:     General: No focal deficit present.     Mental Status: He is alert.     Cranial Nerves: No cranial nerve deficit, dysarthria or facial asymmetry.     Sensory: No sensory deficit.     Motor: No abnormal muscle tone or seizure activity.     Coordination: Coordination normal.  Psychiatric:        Mood and Affect: Mood normal.     ED Results / Procedures / Treatments   Labs (all labs ordered are listed, but only abnormal results are displayed) Labs Reviewed  COMPREHENSIVE METABOLIC PANEL WITH GFR - Abnormal; Notable for the following components:      Result Value   CO2 20 (*)    Glucose, Bld 278 (*)    Total Bilirubin 1.5 (*)    Anion gap 16 (*)    All other components within normal limits  CBC WITH  DIFFERENTIAL/PLATELET - Abnormal; Notable for the following components:   WBC 12.5 (*)    Neutro Abs 10.5 (*)    All other components within normal limits  BETA-HYDROXYBUTYRIC ACID - Abnormal; Notable for the following components:   Beta-Hydroxybutyric Acid 0.67 (*)    All other components within normal limits  CBG MONITORING, ED - Abnormal; Notable for the following components:   Glucose-Capillary 264 (*)    All other components within normal limits  I-STAT VENOUS BLOOD GAS, ED - Abnormal; Notable for the following components:   pO2, Ven 76 (*)    Calcium, Ion 1.07 (*)    All other components within normal limits  LIPASE, BLOOD  ETHANOL  URINALYSIS, ROUTINE W REFLEX MICROSCOPIC  RAPID URINE DRUG SCREEN, HOSP PERFORMED  BASIC METABOLIC PANEL WITH GFR  TROPONIN I (HIGH SENSITIVITY)  TROPONIN I (HIGH SENSITIVITY)    EKG EKG Interpretation Date/Time:  Thursday Jun 12 2023 11:40:47 EDT Ventricular Rate:  68 PR Interval:  125 QRS Duration:  111 QT Interval:  401 QTC Calculation: 427 R Axis:   85  Text Interpretation: Sinus arrhythmia RSR' in V1 or V2, right VCD or RVH ST elevation suggests acute pericarditis ECG similar to previous on 07 Apr 2023 Confirmed by Trish Furl [phone]) on 06/12/2023 11:45:19 AM  Radiology CT ABDOMEN PELVIS W CONTRAST Result Date: 06/12/2023 CLINICAL DATA:  Abdominal pain, acute, nonlocalized, seizures EXAM: CT ABDOMEN AND PELVIS WITH CONTRAST TECHNIQUE: Multidetector CT imaging of the abdomen and pelvis was performed using the standard protocol following bolus administration of intravenous contrast. RADIATION DOSE REDUCTION: This exam was performed according to the departmental dose-optimization program which includes automated exposure control, adjustment of the mA and/or kV according to patient size and/or use of iterative reconstruction technique. CONTRAST:  75mL OMNIPAQUE  IOHEXOL  350 MG/ML SOLN COMPARISON:  April 18, 2023 FINDINGS: Lower chest: No focal  airspace consolidation or pleural effusion. Hepatobiliary: No mass. No radiopaque stones or wall thickening of the gallbladder. No intrahepatic or extrahepatic biliary ductal dilation. The portal veins are patent. Pancreas: No mass or main ductal dilation. No peripancreatic inflammation or fluid collection. Spleen: Normal size. No mass. Adrenals/Urinary Tract: No adrenal masses. No renal mass. No nephrolithiasis or hydronephrosis. The urinary bladder is distended without focal abnormality. Stomach/Bowel: The stomach is decompressed without focal abnormality. No small bowel wall thickening or inflammation. No small bowel obstruction. Appendectomy. Vascular/Lymphatic: No aortic aneurysm. No intraabdominal or pelvic lymphadenopathy. Reproductive: No prostatomegaly. No free pelvic  fluid. Other: No pneumoperitoneum, ascites, or mesenteric inflammation. Musculoskeletal: No acute fracture or destructive lesion. IMPRESSION: No acute intra-abdominal or pelvic abnormality. Electronically Signed   By: Rance Burrows M.D.   On: 06/12/2023 15:48    Procedures Procedures    Medications Ordered in ED Medications  promethazine  (PHENERGAN ) 25 mg in sodium chloride  0.9 % 50 mL IVPB (has no administration in time range)  pantoprazole  (PROTONIX ) injection 40 mg (has no administration in time range)  morphine  (PF) 4 MG/ML injection 4 mg (4 mg Intravenous Given 06/12/23 1227)  ondansetron  (ZOFRAN ) injection 4 mg (4 mg Intravenous Given 06/12/23 1227)  lactated ringers  bolus 1,000 mL (0 mLs Intravenous Stopped 06/12/23 1558)  iohexol  (OMNIPAQUE ) 350 MG/ML injection 75 mL (75 mLs Intravenous Contrast Given 06/12/23 1420)    ED Course/ Medical Decision Making/ A&P Clinical Course as of 06/12/23 1638  Thu Jun 12, 2023  1234 CBC with Diff(!) Leukocytosis noted.  No anemia. [JK]  1353 Comprehensive metabolic panel(!) Bolick panel shows increased glucose.  Increased anion gap [JK]  1354 Beta-hydroxybutyric acid(!) Slightly  elevated but similar to previous [JK]  1552 CT scan does not show any acute abnormality [JK]  1556 Patient still having some dry heaves.  Will try dose of Phenergan .  Patient would like to try some ice chips. [JK]    Clinical Course User Index [JK] Trish Furl, MD                                 Medical Decision Making Problems Addressed: Abdominal pain, unspecified abdominal location: acute illness or injury that poses a threat to life or bodily functions Nausea and vomiting, unspecified vomiting type: acute illness or injury that poses a threat to life or bodily functions Syncope, unspecified syncope type: acute illness or injury that poses a threat to life or bodily functions  Amount and/or Complexity of Data Reviewed Labs: ordered. Decision-making details documented in ED Course. Radiology: ordered and independent interpretation performed.  Risk Prescription drug management.   Patient presented to the ED for evaluation of abdominal pain and vomiting.  Patient also reportedly had possible seizure although I suspect this was more of a syncopal episode.  Consider the possibility of DKA with his complaints of nausea vomiting and hyperglycemia.  Patient did have slight increase in the beta hydroxybutyric acid level and a slight elevated anion gap but overall lower suspicion for DKA at this time.  Patient has been treated with IV fluids.  Will repeat the metabolic panel.  CT scan does not show any evidence of hepatitis or pancreatitis.  Patient has been treated with IV fluids and antiemetics with some improvement the symptoms although not completely resolved.  He would like to try a course of p.o. fluids now.  Will repeat the metabolic panel.  If improving anticipate discharge home with symptomatic care.        Final Clinical Impression(s) / ED Diagnoses Final diagnoses:  Nausea and vomiting, unspecified vomiting type  Syncope, unspecified syncope type  Abdominal pain, unspecified  abdominal location    Rx / DC Orders ED Discharge Orders     None         Trish Furl, MD 06/12/23 [phone]

## 2023-06-12 NOTE — H&P (Signed)
 History and Physical    Patient: Logan Washington BJY:[id number] DOB: 09-23-2000 DOA: 06/12/2023 DOS: the patient was seen and examined on 06/12/2023 PCP: Carlynn Chiles, PA-C  Patient coming from: Home  Chief Complaint:  Chief Complaint  Patient presents with   Abdominal Pain   HPI: Logan Washington is a 23 y.o. male with medical history significant of type 1 diabetes, ADHD, THC abuse who presented to the ER with abdominal pain nausea and vomiting.  Patient has had intractable nausea vomiting not responding to initial treatment in the ER.  He has received Phenergan , Zofran , droperidol .  Continues to vomit.  Suspicion for possible hyperemesis cannabinol or diabetic gastroparesis.  With patient unable to keep anything down, he has been admitted to the hospital for further evaluation and treatment.  Initial workup showed a gap of 16 but no acidosis, some leukocytosis.  Beta hydroxybutyrate acid slightly elevated.  After initial hydration the gap has closed down to 12.  CT abdomen pelvis showed no acute findings.  Patient is being admitted for management of intractable nausea vomiting no diarrhea.  Review of Systems: As mentioned in the history of present illness. All other systems reviewed and are negative. Past Medical History:  Diagnosis Date   ADHD (attention deficit hyperactivity disorder)    Diabetes (HCC)    Diabetes mellitus without complication (HCC)    Past Surgical History:  Procedure Laterality Date   ESOPHAGOGASTRODUODENOSCOPY (EGD) WITH PROPOFOL  N/A 03/18/2021   Procedure: ESOPHAGOGASTRODUODENOSCOPY (EGD) WITH PROPOFOL ;  Surgeon: Ozell Blunt, MD;  Location: [facility];  Service: Endoscopy;  Laterality: N/A;   LAPAROSCOPIC APPENDECTOMY N/A 04/08/2023   Procedure: APPENDECTOMY LAPAROSCOPIC;  Surgeon: Dareen Ebbing, MD;  Location: [facility] OR;  Service: General;  Laterality: N/A;   TONSILLECTOMY     Social History:  reports that he has been smoking cigarettes. He has never used smokeless  tobacco. He reports current drug use. Drug: Marijuana. He reports that he does not drink alcohol.  Allergies  Allergen Reactions   Tilapia [Fish Allergy] Swelling    Mouth     Family History  Problem Relation Age of Onset   Breast cancer Mother    Lung cancer Mother    Colon cancer Maternal Grandmother    Sudden death Neg Hx    Heart attack Neg Hx    Stomach cancer Neg Hx    Pancreatic cancer Neg Hx    Esophageal cancer Neg Hx    Rectal cancer Neg Hx     Prior to Admission medications   Medication Sig Start Date End Date Taking? Authorizing Provider  acetaminophen  (TYLENOL ) 500 MG tablet Take 2 tablets (1,000 mg total) by mouth every 6 (six) hours as needed for mild pain (pain score 1-3) or moderate pain (pain score 4-6) (or Fever >/= 101). 04/08/23  Yes Elwin Hammond, PA-C  LANTUS  SOLOSTAR 100 UNIT/ML Solostar Pen Inject 25 Units into the skin at bedtime. Patient taking differently: Inject 31 Units into the skin at bedtime. 01/25/18  Yes Maylene Spear, MD  NOVOLOG  FLEXPEN 100 UNIT/ML FlexPen Inject 0.5-12 Units into the skin 3 (three) times daily with meals. Sliding scale , Every 7 carbs =1 unit per patient 10/09/15  Yes [provider]    Physical Exam: Vitals:   06/12/23 1400 06/12/23 1600 06/12/23 1645 06/12/23 1700  BP: (!) 138/105 (!) 144/99 (!) 130/93 (!) 133/94  Pulse: 64 65 81 77  Resp: (!) 0 13 (!) 5 12  Temp:    (!) 97.5 F (36.4 C)  TempSrc:    Oral  SpO2: 100% 100% 100% 100%  Weight:      Height:       Constitutional: Acutely ill looking, NAD, calm, comfortable Eyes: PERRL, lids and conjunctivae normal ENMT: Mucous membranes are dry. Posterior pharynx clear of any exudate or lesions.Normal dentition.  Neck: normal, supple, no masses, no thyromegaly Respiratory: clear to auscultation bilaterally, no wheezing, no crackles. Normal respiratory effort. No accessory muscle use.  Cardiovascular: Regular rate and rhythm, no murmurs / rubs / gallops. No  extremity edema. 2+ pedal pulses. No carotid bruits.  Abdomen: no tenderness, no masses palpated. No hepatosplenomegaly. Bowel sounds positive.  Musculoskeletal: Good range of motion, no joint swelling or tenderness, Skin: no rashes, lesions, ulcers. No induration Neurologic: CN 2-12 grossly intact. Sensation intact, DTR normal. Strength 5/5 in all 4.  Psychiatric: Normal judgment and insight. Alert and oriented x 3. Normal mood  Data Reviewed:  Temperature 97.5, blood pressure 130/93, white count 12.5 glucose 251, CT abdomen pelvis showed no acute process  Assessment and Plan:  #1 intractable nausea with vomiting: No diarrhea.  Patient will be admitted.  Symptomatic management.  No obvious cause.  Could be due to gastroparesis from a diabetic.  If not improvement will try Reglan .  Patient could also have had hyperemesis cannabinoids.  Urine drug screen not done.  No infectious source.  #2 type 1 diabetes: Continue with long-acting and short acting insulin .  Continue to hydrate  #3 abdominal pain: Most likely related to the cause of his nausea vomiting.  Continue to monitor  #4 dehydration: Secondary to the nausea and vomiting.  Aggressively hydrate and monitor    Advance Care Planning:   Code Status: Full Code   Consults: None  Family Communication: No family at bedside  Severity of Illness: The appropriate patient status for this patient is OBSERVATION. Observation status is judged to be reasonable and necessary in order to provide the required intensity of service to ensure the patient's safety. The patient's presenting symptoms, physical exam findings, and initial radiographic and laboratory data in the context of their medical condition is felt to place them at decreased risk for further clinical deterioration. Furthermore, it is anticipated that the patient will be medically stable for discharge from the hospital within 2 midnights of admission.   AuthorCarolin Chyle,  MD 06/12/2023 7:57 PM  For on call review [email or web address].

## 2023-06-12 NOTE — ED Notes (Signed)
Please update family

## 2023-06-13 DIAGNOSIS — R1084 Generalized abdominal pain: Secondary | ICD-10-CM

## 2023-06-13 DIAGNOSIS — E119 Type 2 diabetes mellitus without complications: Secondary | ICD-10-CM

## 2023-06-13 DIAGNOSIS — N179 Acute kidney failure, unspecified: Secondary | ICD-10-CM

## 2023-06-13 DIAGNOSIS — F1290 Cannabis use, unspecified, uncomplicated: Secondary | ICD-10-CM

## 2023-06-13 DIAGNOSIS — R112 Nausea with vomiting, unspecified: Secondary | ICD-10-CM | POA: Diagnosis not present

## 2023-06-13 LAB — HEPATIC FUNCTION PANEL
ALT: 20 U/L (ref 0–44)
AST: 20 U/L (ref 15–41)
Albumin: 3.3 g/dL — ABNORMAL LOW (ref 3.5–5.0)
Alkaline Phosphatase: 57 U/L (ref 38–126)
Bilirubin, Direct: 0.2 mg/dL (ref 0.0–0.2)
Indirect Bilirubin: 1.8 mg/dL — ABNORMAL HIGH (ref 0.3–0.9)
Total Bilirubin: 2 mg/dL — ABNORMAL HIGH (ref 0.0–1.2)
Total Protein: 6.2 g/dL — ABNORMAL LOW (ref 6.5–8.1)

## 2023-06-13 LAB — URINALYSIS, ROUTINE W REFLEX MICROSCOPIC
Bacteria, UA: NONE SEEN
Bilirubin Urine: NEGATIVE
Glucose, UA: >=500 mg/dL — AB
Hgb urine dipstick: NEGATIVE
Ketones, ur: 80 mg/dL — AB
Leukocytes,Ua: NEGATIVE
Nitrite: NEGATIVE
Protein, ur: NEGATIVE mg/dL
Specific Gravity, Urine: 1.026 (ref 1.005–1.030)
pH: 6 (ref 5.0–8.0)

## 2023-06-13 LAB — CBC WITH DIFFERENTIAL/PLATELET
Abs Immature Granulocytes: 0.03 10*3/uL (ref 0.00–0.07)
Basophils Absolute: 0 10*3/uL (ref 0.0–0.1)
Basophils Relative: 0 %
Eosinophils Absolute: 0 10*3/uL (ref 0.0–0.5)
Eosinophils Relative: 0 %
HCT: 42.2 % (ref 39.0–52.0)
Hemoglobin: 14.9 g/dL (ref 13.0–17.0)
Immature Granulocytes: 0 %
Lymphocytes Relative: 22 %
Lymphs Abs: 2 10*3/uL (ref 0.7–4.0)
MCH: 29.7 pg (ref 26.0–34.0)
MCHC: 35.3 g/dL (ref 30.0–36.0)
MCV: 84.2 fL (ref 80.0–100.0)
Monocytes Absolute: 0.6 10*3/uL (ref 0.1–1.0)
Monocytes Relative: 7 %
Neutro Abs: 6.3 10*3/uL (ref 1.7–7.7)
Neutrophils Relative %: 71 %
Platelets: 335 10*3/uL (ref 150–400)
RBC: 5.01 MIL/uL (ref 4.22–5.81)
RDW: 12.5 % (ref 11.5–15.5)
WBC: 9 10*3/uL (ref 4.0–10.5)
nRBC: 0 % (ref 0.0–0.2)

## 2023-06-13 LAB — RAPID URINE DRUG SCREEN, HOSP PERFORMED
Amphetamines: NOT DETECTED
Barbiturates: NOT DETECTED
Benzodiazepines: NOT DETECTED
Cocaine: NOT DETECTED
Opiates: POSITIVE — AB
Tetrahydrocannabinol: POSITIVE — AB

## 2023-06-13 LAB — PHOSPHORUS: Phosphorus: 3 mg/dL (ref 2.5–4.6)

## 2023-06-13 LAB — GLUCOSE, CAPILLARY
Glucose-Capillary: 198 mg/dL — ABNORMAL HIGH (ref 70–99)
Glucose-Capillary: 172 mg/dL — ABNORMAL HIGH (ref 70–99)

## 2023-06-13 LAB — MAGNESIUM: Magnesium: 1.5 mg/dL — ABNORMAL LOW (ref 1.7–2.4)

## 2023-06-13 MED ORDER — ONDANSETRON HCL 4 MG PO TABS: 4.0000 mg | ORAL_TABLET | Freq: Four times a day (QID) | ORAL | 0 refills | Status: DC | PRN

## 2023-06-13 MED ORDER — INSULIN GLARGINE-YFGN 100 UNIT/ML ~~LOC~~ SOLN
10.0000 [IU] | Freq: Every day | SUBCUTANEOUS | Status: DC
  Administered 2023-06-13: 10 [IU] via SUBCUTANEOUS
  Filled 2023-06-13: qty 0.1

## 2023-06-13 MED ORDER — LACTATED RINGERS IV BOLUS
1000.0000 mL | Freq: Once | INTRAVENOUS | Status: AC
  Administered 2023-06-13: 1000 mL via INTRAVENOUS

## 2023-06-13 MED ORDER — INSULIN GLARGINE-YFGN 100 UNIT/ML ~~LOC~~ SOPN: 10.0000 [IU] | PEN_INJECTOR | SUBCUTANEOUS | Status: DC

## 2023-06-13 MED ORDER — MAGNESIUM SULFATE 4 GM/100ML IV SOLN
4.0000 g | Freq: Once | INTRAVENOUS | Status: AC
  Administered 2023-06-13: 4 g via INTRAVENOUS
  Filled 2023-06-13: qty 100

## 2023-06-13 NOTE — Discharge Summary (Signed)
 Physician Discharge Summary   Patient: Logan Washington MRN: [id number] DOB: 2000-10-08  Admit date:     06/12/2023  Discharge date: 06/13/23  Discharge Physician: Aura Leeds, DO   PCP: Carlynn Chiles, PA-C   Recommendations at discharge:   Follow-up with PCP within 1 to 2 weeks repeat CBC, CMP, mag, Phos within 1 week; also follow-up with endocrinology in outpatient setting Avoid illicit substances and cut down on marijuana consumption  Discharge Diagnoses: Principal Problem:   Intractable nausea and vomiting Active Problems:   Diabetes mellitus without complication (HCC)   AKI (acute kidney injury) (HCC)   Abdominal pain  Resolved Problems:   * No resolved hospital problems. *  Hospital Course: Logan Washington is a 23 y.o. male with medical history significant of type 1 diabetes, ADHD, THC abuse who presented to the ER with abdominal pain nausea and vomiting.  Patient has had intractable nausea vomiting not responding to initial treatment in the ER.  He has received Phenergan , Zofran , droperidol .  Continues to vomit.  Suspicion for possible hyperemesis cannabinol or diabetic gastroparesis.  With patient unable to keep anything down, he has been admitted to the hospital for further evaluation and treatment.  Initial workup showed a gap of 16 but no acidosis, some leukocytosis.  Beta hydroxybutyrate acid slightly elevated.  After initial hydration the gap has closed down to 12.  CT abdomen pelvis showed no acute findings.  Patient is being admitted for management of intractable nausea vomiting no diarrhea. Improved and resolved and stable for D/C.  Assessment and Plan:  Intractable nausea with vomiting: No diarrhea. 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 Improved signficiantly.  Symptomatic management.  No obvious cause.  Could be due to gastroparesis from a diabetic.  If not improvement will try Reglan .  Patient could also have had hyperemesis  cannabinoids.  Urine drug screen + for Opiates and THC. No infectious source. Follow up w/ PCP and control Blood Sugars and Avoid TCH.    Type 1 diabetes: Continue with long-acting and short acting insulin .  Continue to hydrate and improved. CTM Blood Sugars   Abdominal pain: Resolved Most likely related to the cause of his nausea vomiting.  Continue to monitor   Dehydration: Secondary to the nausea and vomiting.  Aggressively hydrate and monitor  Leukocytosis: Improved. WBC went from 12.5 -> 9.0. CTM and Trend and Repeat CBC w/in 1 week  Hypomagnesemia: Mg was 1.5. Replete with IV Mag Suflate 4 grams. CTM and Trend and repeat Mag w/in 1 week  Hyperbilirubinemia: Likely Reactive. T Bili went from 1.5 -> 2.0. CTM and repeat CMP w/in 1 week  Hypoalbuminemia: Patient's Albumin Lvl went from 3.9 -> 3.3. CTM and Trend and repeat CMP in the AM   Consultants: None Procedures performed: Stable  Disposition: Home Diet recommendation:  Discharge Diet Orders (From admission, onward)     Start     Ordered   06/13/23 0000  Diet Carb Modified        06/13/23 1500           Carb modified diet DISCHARGE MEDICATION: Allergies as of 06/13/2023       Reactions   Tilapia [fish Allergy] Swelling   Mouth         Medication List     TAKE these medications    acetaminophen  500 MG tablet Commonly known as: TYLENOL  Take 2 tablets (1,000 mg total) by mouth every 6 (six) hours as needed for mild pain (pain score 1-3)  or moderate pain (pain score 4-6) (or Fever >/= 101).   Lantus  SoloStar 100 UNIT/ML Solostar Pen Generic drug: insulin  glargine Inject 25 Units into the skin at bedtime. What changed: how much to take   NovoLOG  FlexPen 100 UNIT/ML FlexPen Generic drug: insulin  aspart Inject 0.5-12 Units into the skin 3 (three) times daily with meals. Sliding scale , Every 7 carbs =1 unit per patient   ondansetron  4 MG tablet Commonly known as: ZOFRAN  Take 1 tablet (4 mg total) by mouth  every 6 (six) hours as needed for nausea.       Discharge Exam: Filed Weights   06/12/23 1137  Weight: 75.8 kg   Vitals:   06/13/23 0459 06/13/23 0822  BP: (!) 144/76 (!) 137/90  Pulse: 100 92  Resp: 17 18  Temp: 98.9 F (37.2 C) 98.1 F (36.7 C)  SpO2: 99% 100%   Examination: Physical Exam:  Constitutional: Thin AAM in NAD appears calm Respiratory: Clear to auscultation bilaterally, no wheezing, rales, rhonchi or crackles. Normal respiratory effort and patient is not tachypenic. No accessory muscle use.  Cardiovascular: RRR, no murmurs / rubs / gallops. S1 and S2 auscultated. No extremity edema.  Abdomen: Soft, non-tender, non-distended.  Bowel sounds positive.  GU: Deferred. Musculoskeletal: No clubbing / cyanosis of digits/nails. No joint deformity upper and lower extremities.  Skin: No rashes, lesions, ulcers on a limited skin evaluation. No induration; Warm and dry. Has some tattoos noted. Neurologic: CN 2-12 grossly intact with no focal deficits. Romberg sign and cerebellar reflexes not assessed.  Psychiatric: Normal judgment and insight. Alert and oriented x 3. Normal mood and appropriate affect.   Condition at discharge: stable  The results of significant diagnostics from this hospitalization (including imaging, microbiology, ancillary and laboratory) are listed below for reference.   Imaging Studies: CT ABDOMEN PELVIS W CONTRAST Result Date: 06/12/2023 CLINICAL DATA:  Abdominal pain, acute, nonlocalized, seizures EXAM: CT ABDOMEN AND PELVIS WITH CONTRAST TECHNIQUE: Multidetector CT imaging of the abdomen and pelvis was performed using the standard protocol following bolus administration of intravenous contrast. RADIATION DOSE REDUCTION: This exam was performed according to the departmental dose-optimization program which includes automated exposure control, adjustment of the mA and/or kV according to patient size and/or use of iterative reconstruction technique.  CONTRAST:  75mL OMNIPAQUE  IOHEXOL  350 MG/ML SOLN COMPARISON:  April 18, 2023 FINDINGS: Lower chest: No focal airspace consolidation or pleural effusion. Hepatobiliary: No mass. No radiopaque stones or wall thickening of the gallbladder. No intrahepatic or extrahepatic biliary ductal dilation. The portal veins are patent. Pancreas: No mass or main ductal dilation. No peripancreatic inflammation or fluid collection. Spleen: Normal size. No mass. Adrenals/Urinary Tract: No adrenal masses. No renal mass. No nephrolithiasis or hydronephrosis. The urinary bladder is distended without focal abnormality. Stomach/Bowel: The stomach is decompressed without focal abnormality. No small bowel wall thickening or inflammation. No small bowel obstruction. Appendectomy. Vascular/Lymphatic: No aortic aneurysm. No intraabdominal or pelvic lymphadenopathy. Reproductive: No prostatomegaly. No free pelvic fluid. Other: No pneumoperitoneum, ascites, or mesenteric inflammation. Musculoskeletal: No acute fracture or destructive lesion. IMPRESSION: No acute intra-abdominal or pelvic abnormality. Electronically Signed   By: Rance Burrows M.D.   On: 06/12/2023 15:48   Microbiology: Results for orders placed or performed during the hospital encounter of 04/07/23  Resp panel by RT-PCR (RSV, Flu A&B, Covid) Anterior Nasal Swab     Status: Abnormal   Collection Time: 04/07/23  3:00 PM   Specimen: Anterior Nasal Swab  Result Value Ref Range Status  SARS Coronavirus 2 by RT PCR POSITIVE (A) NEGATIVE Final    Comment: (NOTE) SARS-CoV-2 target nucleic acids are DETECTED.  The SARS-CoV-2 RNA is generally detectable in upper respiratory specimens during the acute phase of infection. Positive results are indicative of the presence of the identified virus, but do not rule out bacterial infection or co-infection with other pathogens not detected by the test. Clinical correlation with patient history and other diagnostic information is  necessary to determine patient infection status. The expected result is Negative.  Fact Sheet for Patients: BloggerCourse.com  Fact Sheet for Healthcare Providers: SeriousBroker.it  This test is not yet approved or cleared by the [place]  FDA and  has been authorized for detection and/or diagnosis of SARS-CoV-2 by FDA under an Emergency Use Authorization (EUA).  This EUA will remain in effect (meaning this test can be used) for the duration of  the COVID-19 declaration under Section 564(b)(1) of the A ct, 21 U.S.C. section 360bbb-3(b)(1), unless the authorization is terminated or revoked sooner.     Influenza A by PCR NEGATIVE NEGATIVE Final   Influenza B by PCR NEGATIVE NEGATIVE Final    Comment: (NOTE) The Xpert Xpress SARS-CoV-2/FLU/RSV plus assay is intended as an aid in the diagnosis of influenza from Nasopharyngeal swab specimens and should not be used as a sole basis for treatment. Nasal washings and aspirates are unacceptable for Xpert Xpress SARS-CoV-2/FLU/RSV testing.  Fact Sheet for Patients: BloggerCourse.com  Fact Sheet for Healthcare Providers: SeriousBroker.it  This test is not yet approved or cleared by the [place]  FDA and has been authorized for detection and/or diagnosis of SARS-CoV-2 by FDA under an Emergency Use Authorization (EUA). This EUA will remain in effect (meaning this test can be used) for the duration of the COVID-19 declaration under Section 564(b)(1) of the Act, 21 U.S.C. section 360bbb-3(b)(1), unless the authorization is terminated or revoked.     Resp Syncytial Virus by PCR NEGATIVE NEGATIVE Final    Comment: (NOTE) Fact Sheet for Patients: BloggerCourse.com  Fact Sheet for Healthcare Providers: SeriousBroker.it  This test is not yet approved or cleared by the [place]  FDA  and has been authorized for detection and/or diagnosis of SARS-CoV-2 by FDA under an Emergency Use Authorization (EUA). This EUA will remain in effect (meaning this test can be used) for the duration of the COVID-19 declaration under Section 564(b)(1) of the Act, 21 U.S.C. section 360bbb-3(b)(1), unless the authorization is terminated or revoked.  Performed at [facility], [address] [place], [place] [address]    Labs: CBC: Recent Labs  Lab 06/12/23 1146 06/12/23 1232 06/13/23 0954  WBC 12.5*  --  9.0  NEUTROABS 10.5*  --  6.3  HGB 16.2 16.0 14.9  HCT 46.5 47.0 42.2  MCV 85.2  --  84.2  PLT 328  --  335   Basic Metabolic Panel: Recent Labs  Lab 06/12/23 1146 06/12/23 1232 06/12/23 1820 06/13/23 0954  NA 137 136 137  --   K 4.4 4.9 4.4  --   CL 101  --  100  --   CO2 20*  --  25  --   GLUCOSE 278*  --  251*  --   BUN 13  --  12  --   CREATININE 0.96  --  0.99  --   CALCIUM 9.2  --  9.3  --   MG  --   --   --  1.5*  PHOS  --   --   --  3.0   Liver Function Tests: Recent Labs  Lab 06/12/23 1146 06/13/23 0954  AST 25 20  ALT 21 20  ALKPHOS 62 57  BILITOT 1.5* 2.0*  PROT 6.8 6.2*  ALBUMIN 3.9 3.3*   CBG: Recent Labs  Lab 06/12/23 1142 06/12/23 2239 06/13/23 0818 06/13/23 1146  GLUCAP 264* 231* 198* 172*   Discharge time spent: greater than 30 minutes.  Signed: Aura Leeds, DO [facility] 06/16/2023

## 2023-06-13 NOTE — Plan of Care (Signed)
  Problem: Education: Goal: Knowledge of General Education information will improve Description: Including pain rating scale, medication(s)/side effects and non-pharmacologic comfort measures Outcome: Progressing   Problem: Health Behavior/Discharge Planning: Goal: Ability to manage health-related needs will improve Outcome: Progressing   Problem: Clinical Measurements: Goal: Ability to maintain clinical measurements within normal limits will improve Outcome: Progressing Goal: Will remain free from infection Outcome: Progressing Goal: Diagnostic test results will improve Outcome: Progressing Goal: Respiratory complications will improve Outcome: Progressing Goal: Cardiovascular complication will be avoided Outcome: Progressing   Problem: Activity: Goal: Risk for activity intolerance will decrease Outcome: Progressing   Problem: Nutrition: Goal: Adequate nutrition will be maintained Outcome: Progressing   Problem: Nutrition: Goal: Adequate nutrition will be maintained Outcome: Progressing   Problem: Coping: Goal: Level of anxiety will decrease Outcome: Progressing   Problem: Elimination: Goal: Will not experience complications related to bowel motility Outcome: Progressing Goal: Will not experience complications related to urinary retention Outcome: Progressing   Problem: Pain Managment: Goal: General experience of comfort will improve and/or be controlled Outcome: Progressing   Problem: Safety: Goal: Ability to remain free from injury will improve Outcome: Progressing   Problem: Skin Integrity: Goal: Risk for impaired skin integrity will decrease Outcome: Progressing   Problem: Education: Goal: Ability to describe self-care measures that may prevent or decrease complications (Diabetes Survival Skills Education) will improve Outcome: Progressing Goal: Individualized Educational Video(s) Outcome: Progressing   Problem: Coping: Goal: Ability to adjust to  condition or change in health will improve Outcome: Progressing   Problem: Fluid Volume: Goal: Ability to maintain a balanced intake and output will improve Outcome: Progressing   Problem: Health Behavior/Discharge Planning: Goal: Ability to identify and utilize available resources and services will improve Outcome: Progressing Goal: Ability to manage health-related needs will improve Outcome: Progressing   Problem: Metabolic: Goal: Ability to maintain appropriate glucose levels will improve Outcome: Progressing   Problem: Nutritional: Goal: Maintenance of adequate nutrition will improve Outcome: Progressing Goal: Progress toward achieving an optimal weight will improve Outcome: Progressing   Problem: Skin Integrity: Goal: Risk for impaired skin integrity will decrease Outcome: Progressing   Problem: Tissue Perfusion: Goal: Adequacy of tissue perfusion will improve Outcome: Progressing

## 2023-06-13 NOTE — Inpatient Diabetes Management (Signed)
 Inpatient Diabetes Program Recommendations  AACE/ADA: New Consensus Statement on Inpatient Glycemic Control (2015)  Target Ranges:  Prepandial:   less than 140 mg/dL      Peak postprandial:   less than 180 mg/dL (1-2 hours)      Critically ill patients:  140 - 180 mg/dL   Lab Results  Component Value Date   GLUCAP 172 (H) 06/13/2023   HGBA1C 7.3 (H) 04/08/2023    Review of Glycemic Control  Latest Reference Range & Units 06/12/23 11:42 06/12/23 22:39 06/13/23 08:18 06/13/23 11:46  Glucose-Capillary 70 - 99 mg/dL 811 (H) 914 (H) 782 (H) 172 (H)   Diabetes history: DM type 1 requires basal insulin  and short acting insulin  for correction and carb coverage Outpatient Diabetes medications: Lantus  31 units, Novolog  0.5-12 units correction 1 unit for every 7 grams of carbs Current orders for Inpatient glycemic control:  Novolog  0-20 units tid + hs  Soft diet ordered  Inpatient Diabetes Program Recommendations:    -  Start Semglee  10 units  May need additional meal coverage insulin  as pt starts eating due to pt having type 1 diabetes and needing meal coverage insulin  like he takes at home.  Thanks,  Eloise Hake RN, MSN, BC-ADM Inpatient Diabetes Coordinator Team Pager [phone] (8a-5p)

## 2023-06-15 NOTE — Hospital Course (Signed)
 Logan Washington is a 23 y.o. male with medical history significant of type 1 diabetes, ADHD, THC abuse who presented to the ER with abdominal pain nausea and vomiting.  Patient has had intractable nausea vomiting not responding to initial treatment in the ER.  He has received Phenergan , Zofran , droperidol .  Continues to vomit.  Suspicion for possible hyperemesis cannabinol or diabetic gastroparesis.  With patient unable to keep anything down, he has been admitted to the hospital for further evaluation and treatment.  Initial workup showed a gap of 16 but no acidosis, some leukocytosis.  Beta hydroxybutyrate acid slightly elevated.  After initial hydration the gap has closed down to 12.  CT abdomen pelvis showed no acute findings.  Patient is being admitted for management of intractable nausea vomiting no diarrhea. Improved and resolved and stable for D/C.  Assessment and Plan:  Intractable nausea with vomiting: No diarrhea. 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 Improved signficiantly.  Symptomatic management.  No obvious cause.  Could be due to gastroparesis from a diabetic.  If not improvement will try Reglan .  Patient could also have had hyperemesis cannabinoids.  Urine drug screen + for Opiates and THC. No infectious source. Follow up w/ PCP and control Blood Sugars and Avoid TCH.    Type 1 diabetes: Continue with long-acting and short acting insulin .  Continue to hydrate and improved. CTM Blood Sugars   Abdominal pain: Resolved Most likely related to the cause of his nausea vomiting.  Continue to monitor   Dehydration: Secondary to the nausea and vomiting.  Aggressively hydrate and monitor  Leukocytosis: Improved. WBC went from 12.5 -> 9.0. CTM and Trend and Repeat CBC w/in 1 week  Hypomagnesemia: Mg was 1.5. Replete with IV Mag Suflate 4 grams. CTM and Trend and repeat Mag w/in 1 week  Hyperbilirubinemia: Likely Reactive. T Bili went from 1.5 -> 2.0. CTM  and repeat CMP w/in 1 week  Hypoalbuminemia: Patient's Albumin Lvl went from 3.9 -> 3.3. CTM and Trend and repeat CMP in the AM

## 2023-06-17 ENCOUNTER — Telehealth: Payer: Self-pay | Admitting: *Deleted

## 2023-06-18 ENCOUNTER — Other Ambulatory Visit (HOSPITAL_BASED_OUTPATIENT_CLINIC_OR_DEPARTMENT_OTHER): Payer: Self-pay

## 2023-06-18 ENCOUNTER — Other Ambulatory Visit: Payer: Self-pay

## 2023-06-18 ENCOUNTER — Encounter (HOSPITAL_BASED_OUTPATIENT_CLINIC_OR_DEPARTMENT_OTHER): Payer: Self-pay

## 2023-06-18 ENCOUNTER — Observation Stay (HOSPITAL_BASED_OUTPATIENT_CLINIC_OR_DEPARTMENT_OTHER)
Admission: EM | Admit: 2023-06-18 | Discharge: 2023-06-19 | Disposition: A | Discharge status: Home / Self Care | Attending: Student | Admitting: Student

## 2023-06-18 DIAGNOSIS — E119 Type 2 diabetes mellitus without complications: Secondary | ICD-10-CM

## 2023-06-18 DIAGNOSIS — Z794 Long term (current) use of insulin: Secondary | ICD-10-CM | POA: Insufficient documentation

## 2023-06-18 DIAGNOSIS — R1110 Vomiting, unspecified: Secondary | ICD-10-CM | POA: Diagnosis not present

## 2023-06-18 DIAGNOSIS — Z79899 Other long term (current) drug therapy: Secondary | ICD-10-CM | POA: Diagnosis not present

## 2023-06-18 DIAGNOSIS — F17210 Nicotine dependence, cigarettes, uncomplicated: Secondary | ICD-10-CM | POA: Insufficient documentation

## 2023-06-18 DIAGNOSIS — R110 Nausea: Secondary | ICD-10-CM

## 2023-06-18 DIAGNOSIS — E876 Hypokalemia: Secondary | ICD-10-CM | POA: Diagnosis not present

## 2023-06-18 DIAGNOSIS — R112 Nausea with vomiting, unspecified: Secondary | ICD-10-CM | POA: Diagnosis present

## 2023-06-18 DIAGNOSIS — E109 Type 1 diabetes mellitus without complications: Secondary | ICD-10-CM | POA: Diagnosis not present

## 2023-06-18 DIAGNOSIS — R109 Unspecified abdominal pain: Principal | ICD-10-CM

## 2023-06-18 LAB — COMPREHENSIVE METABOLIC PANEL WITH GFR
ALT: 13 U/L (ref 0–44)
AST: 14 U/L — ABNORMAL LOW (ref 15–41)
Albumin: 4.6 g/dL (ref 3.5–5.0)
Alkaline Phosphatase: 63 U/L (ref 38–126)
Anion gap: 10 (ref 5–15)
BUN: 8 mg/dL (ref 6–20)
CO2: 28 mmol/L (ref 22–32)
Calcium: 9.1 mg/dL (ref 8.9–10.3)
Chloride: 100 mmol/L (ref 98–111)
Creatinine, Ser: 0.88 mg/dL (ref 0.61–1.24)
GFR, Estimated: >60 mL/min (ref >60)
Glucose, Bld: 172 mg/dL — ABNORMAL HIGH (ref 70–99)
Potassium: 3.4 mmol/L — ABNORMAL LOW (ref 3.5–5.1)
Sodium: 138 mmol/L (ref 135–145)
Total Bilirubin: 1.2 mg/dL (ref 0.0–1.2)
Total Protein: 7.1 g/dL (ref 6.5–8.1)

## 2023-06-18 LAB — GLUCOSE, CAPILLARY
Glucose-Capillary: 205 mg/dL — ABNORMAL HIGH (ref 70–99)
Glucose-Capillary: 205 mg/dL — ABNORMAL HIGH (ref 70–99)
Glucose-Capillary: 73 mg/dL (ref 70–99)

## 2023-06-18 LAB — CBC WITH DIFFERENTIAL/PLATELET
Abs Immature Granulocytes: 0.02 10*3/uL (ref 0.00–0.07)
Basophils Absolute: 0 10*3/uL (ref 0.0–0.1)
Basophils Relative: 0 %
Eosinophils Absolute: 0.1 10*3/uL (ref 0.0–0.5)
Eosinophils Relative: 1 %
HCT: 44.4 % (ref 39.0–52.0)
Hemoglobin: 15.7 g/dL (ref 13.0–17.0)
Immature Granulocytes: 0 %
Lymphocytes Relative: 25 %
Lymphs Abs: 1.9 10*3/uL (ref 0.7–4.0)
MCH: 29.1 pg (ref 26.0–34.0)
MCHC: 35.4 g/dL (ref 30.0–36.0)
MCV: 82.4 fL (ref 80.0–100.0)
Monocytes Absolute: 0.5 10*3/uL (ref 0.1–1.0)
Monocytes Relative: 7 %
Neutro Abs: 5.1 10*3/uL (ref 1.7–7.7)
Neutrophils Relative %: 67 %
Platelets: 456 10*3/uL — ABNORMAL HIGH (ref 150–400)
RBC: 5.39 MIL/uL (ref 4.22–5.81)
RDW: 12.2 % (ref 11.5–15.5)
WBC: 7.6 10*3/uL (ref 4.0–10.5)
nRBC: 0 % (ref 0.0–0.2)

## 2023-06-18 LAB — LIPASE, BLOOD: Lipase: 32 U/L (ref 11–51)

## 2023-06-18 MED ORDER — OXYCODONE HCL 5 MG PO TABS: 5.0000 mg | ORAL_TABLET | ORAL | Status: DC | PRN

## 2023-06-18 MED ORDER — INSULIN GLARGINE-YFGN 100 UNIT/ML ~~LOC~~ SOLN
8.0000 [IU] | Freq: Two times a day (BID) | SUBCUTANEOUS | Status: DC
  Administered 2023-06-18 (×2): 8 [IU] via SUBCUTANEOUS
  Filled 2023-06-18 (×4): qty 0.08

## 2023-06-18 MED ORDER — ENOXAPARIN SODIUM 40 MG/0.4ML IJ SOSY
40.0000 mg | PREFILLED_SYRINGE | INTRAMUSCULAR | Status: DC
  Administered 2023-06-18: 40 mg via SUBCUTANEOUS
  Filled 2023-06-18: qty 0.4

## 2023-06-18 MED ORDER — SODIUM CHLORIDE 0.9 % IV SOLN
25.0000 mg | Freq: Four times a day (QID) | INTRAVENOUS | Status: DC | PRN
  Administered 2023-06-18: 25 mg via INTRAVENOUS
  Filled 2023-06-18 (×3): qty 1

## 2023-06-18 MED ORDER — SODIUM CHLORIDE 0.9 % IV BOLUS
1000.0000 mL | Freq: Once | INTRAVENOUS | Status: AC
  Administered 2023-06-18: 1000 mL via INTRAVENOUS

## 2023-06-18 MED ORDER — DROPERIDOL 2.5 MG/ML IJ SOLN
1.2500 mg | Freq: Once | INTRAMUSCULAR | Status: AC
  Administered 2023-06-18: 1.25 mg via INTRAVENOUS
  Filled 2023-06-18: qty 2

## 2023-06-18 MED ORDER — ONDANSETRON HCL 4 MG/2ML IJ SOLN
4.0000 mg | Freq: Four times a day (QID) | INTRAMUSCULAR | Status: DC | PRN
  Administered 2023-06-18: 4 mg via INTRAVENOUS
  Filled 2023-06-18: qty 2

## 2023-06-18 MED ORDER — PROMETHAZINE HCL 25 MG/ML IJ SOLN
INTRAMUSCULAR | Status: AC
  Filled 2023-06-18: qty 1

## 2023-06-18 MED ORDER — ONDANSETRON HCL 4 MG/2ML IJ SOLN
4.0000 mg | Freq: Once | INTRAMUSCULAR | Status: AC
  Administered 2023-06-18: 4 mg via INTRAVENOUS
  Filled 2023-06-18: qty 2

## 2023-06-18 MED ORDER — INSULIN ASPART 100 UNIT/ML IJ SOLN
0.0000 [IU] | Freq: Every day | INTRAMUSCULAR | Status: DC
Start: 2023-06-18 — End: 2023-06-19

## 2023-06-18 MED ORDER — PANTOPRAZOLE SODIUM 40 MG IV SOLR
40.0000 mg | INTRAVENOUS | Status: DC
  Administered 2023-06-19: 40 mg via INTRAVENOUS
  Filled 2023-06-18: qty 10

## 2023-06-18 MED ORDER — ALBUTEROL SULFATE (2.5 MG/3ML) 0.083% IN NEBU: 2.5000 mg | INHALATION_SOLUTION | RESPIRATORY_TRACT | Status: DC | PRN

## 2023-06-18 MED ORDER — ACETAMINOPHEN 325 MG PO TABS: 650.0000 mg | ORAL_TABLET | Freq: Four times a day (QID) | ORAL | Status: DC | PRN

## 2023-06-18 MED ORDER — ACETAMINOPHEN 650 MG RE SUPP: 650.0000 mg | Freq: Four times a day (QID) | RECTAL | Status: DC | PRN

## 2023-06-18 MED ORDER — SODIUM CHLORIDE 0.9 % IV SOLN: INTRAVENOUS | Status: AC

## 2023-06-18 MED ORDER — SODIUM CHLORIDE 0.9 % IV SOLN
25.0000 mg | Freq: Once | INTRAVENOUS | Status: AC
  Administered 2023-06-18: 25 mg via INTRAVENOUS
  Filled 2023-06-18: qty 1

## 2023-06-18 MED ORDER — PROMETHAZINE HCL 25 MG PO TABS
25.0000 mg | ORAL_TABLET | Freq: Four times a day (QID) | ORAL | 0 refills | Status: DC | PRN
  Filled 2023-06-18: qty 30, 8d supply, fill #0

## 2023-06-18 MED ORDER — INSULIN ASPART 100 UNIT/ML IJ SOLN
0.0000 [IU] | Freq: Three times a day (TID) | INTRAMUSCULAR | Status: DC
Start: 2023-06-18 — End: 2023-06-19
  Administered 2023-06-18: 5 [IU] via SUBCUTANEOUS

## 2023-06-18 MED ORDER — SODIUM CHLORIDE 0.9 % IV SOLN: Freq: Once | INTRAVENOUS | Status: AC

## 2023-06-18 MED ORDER — PANTOPRAZOLE SODIUM 40 MG IV SOLR
40.0000 mg | Freq: Once | INTRAVENOUS | Status: AC
  Administered 2023-06-18: 40 mg via INTRAVENOUS
  Filled 2023-06-18: qty 10

## 2023-06-18 MED ORDER — HYDROMORPHONE HCL 1 MG/ML IJ SOLN: 0.5000 mg | INTRAMUSCULAR | Status: DC | PRN

## 2023-06-18 NOTE — Plan of Care (Signed)
   Problem: Education: Goal: Knowledge of General Education information will improve Description Including pain rating scale, medication(s)/side effects and non-pharmacologic comfort measures Outcome: Progressing   Problem: Health Behavior/Discharge Planning: Goal: Ability to manage health-related needs will improve Outcome: Progressing

## 2023-06-18 NOTE — H&P (Signed)
 History and Physical  Jeffory Shafiq XFG:[id number] DOB: 2000-06-28 DOA: 06/18/2023  PCP: Carlynn Chiles, PA-C   Chief Complaint: Vomiting  HPI: Logan Washington is a 23 y.o. male with medical history significant for type 1 diabetes prior cannabinoid use being admitted to the hospital with recurrent intractable nausea and vomiting.  He was recently hospitalized at [facility] from 5/1 to 5/2 for intractable nausea and vomiting with associated dehydration.  Patient states he was doing well until about 2 days ago, when he had to go to [facility] ED with continued symptoms of cyclic vomiting.  He denies any fevers, has some generalized abdominal pain which is worse than previously but otherwise very reminiscent of his previous abdominal pain with vomiting.  Denies any hematemesis, states he is constipated which is quite typical for him.  Has otherwise been compliant with his medications denies any cough, denies any recent cannabinoid use.  Has been in the emergency department all morning, unable to stop vomiting and so hospitalist admission was requested.  Review of Systems: Please see HPI for pertinent positives and negatives. A complete 10 system review of systems are otherwise negative.  Past Medical History:  Diagnosis Date   ADHD (attention deficit hyperactivity disorder)    Diabetes (HCC)    Diabetes mellitus without complication (HCC)    Past Surgical History:  Procedure Laterality Date   ESOPHAGOGASTRODUODENOSCOPY (EGD) WITH PROPOFOL  N/A 03/18/2021   Procedure: ESOPHAGOGASTRODUODENOSCOPY (EGD) WITH PROPOFOL ;  Surgeon: Ozell Blunt, MD;  Location: [facility] ENDOSCOPY;  Service: Endoscopy;  Laterality: N/A;   LAPAROSCOPIC APPENDECTOMY N/A 04/08/2023   Procedure: APPENDECTOMY LAPAROSCOPIC;  Surgeon: Dareen Ebbing, MD;  Location: [facility] OR;  Service: General;  Laterality: N/A;   TONSILLECTOMY     Social History:  reports that he has been smoking cigarettes. He has never used smokeless tobacco. He  reports current drug use. Drug: Marijuana. He reports that he does not drink alcohol.  Allergies  Allergen Reactions   Banana    Tilapia [Fish Allergy] Swelling    Mouth     Family History  Problem Relation Age of Onset   Breast cancer Mother    Lung cancer Mother    Colon cancer Maternal Grandmother    Sudden death Neg Hx    Heart attack Neg Hx    Stomach cancer Neg Hx    Pancreatic cancer Neg Hx    Esophageal cancer Neg Hx    Rectal cancer Neg Hx      Prior to Admission medications   Medication Sig Start Date End Date Taking? Authorizing Provider  promethazine  (PHENERGAN ) 25 MG tablet Take 1 tablet (25 mg total) by mouth every 6 (six) hours as needed for nausea or vomiting. 06/18/23  Yes Curatolo, Adam, DO  acetaminophen  (TYLENOL ) 500 MG tablet Take 2 tablets (1,000 mg total) by mouth every 6 (six) hours as needed for mild pain (pain score 1-3) or moderate pain (pain score 4-6) (or Fever >/= 101). 04/08/23   Elwin Hammond, PA-C  LANTUS  SOLOSTAR 100 UNIT/ML Solostar Pen Inject 25 Units into the skin at bedtime. Patient taking differently: Inject 31 Units into the skin at bedtime. 01/25/18   Krishnan, Gokul, MD  NOVOLOG  FLEXPEN 100 UNIT/ML FlexPen Inject 0.5-12 Units into the skin 3 (three) times daily with meals. Sliding scale , Every 7 carbs =1 unit per patient 10/09/15   [provider]  ondansetron  (ZOFRAN ) 4 MG tablet Take 1 tablet (4 mg total) by mouth every 6 (six) hours as needed for  nausea. 06/13/23   Eveline Hipps, DO    Physical Exam: BP (!) 160/88 (BP Location: Right Arm)   Pulse 71   Temp 98.7 F (37.1 C) (Oral)   Resp 16   Ht 5\' 10"  (1.778 m)   Wt 75.8 kg   SpO2 100%   BMI 23.96 kg/m  General:  Alert, oriented, calm, looks tired but in no acute distress  Eyes: EOMI, clear conjuctivae, white sclerea Neck: supple, no masses, trachea mildline  Cardiovascular: RRR, no murmurs or rubs, no peripheral edema  Respiratory: clear to auscultation  bilaterally, no wheezes, no crackles  Abdomen: soft, nontender, nondistended, normal bowel tones heard  Skin: dry, no rashes  Musculoskeletal: no joint effusions, normal range of motion  Psychiatric: appropriate affect, normal speech  Neurologic: extraocular muscles intact, clear speech, moving all extremities with intact sensorium         Labs on Admission:  Basic Metabolic Panel: Recent Labs  Lab 06/12/23 1146 06/12/23 1232 06/12/23 1820 06/13/23 0954 06/18/23 0736  NA 137 136 137  --  138  K 4.4 4.9 4.4  --  3.4*  CL 101  --  100  --  100  CO2 20*  --  25  --  28  GLUCOSE 278*  --  251*  --  172*  BUN 13  --  12  --  8  CREATININE 0.96  --  0.99  --  0.88  CALCIUM 9.2  --  9.3  --  9.1  MG  --   --   --  1.5*  --   PHOS  --   --   --  3.0  --    Liver Function Tests: Recent Labs  Lab 06/12/23 1146 06/13/23 0954 06/18/23 0736  AST 25 20 14*  ALT 21 20 13   ALKPHOS 62 57 63  BILITOT 1.5* 2.0* 1.2  PROT 6.8 6.2* 7.1  ALBUMIN 3.9 3.3* 4.6   Recent Labs  Lab 06/12/23 1146 06/18/23 0736  LIPASE 24 32   No results for input(s): "AMMONIA" in the last 168 hours. CBC: Recent Labs  Lab 06/12/23 1146 06/12/23 1232 06/13/23 0954 06/18/23 0736  WBC 12.5*  --  9.0 7.6  NEUTROABS 10.5*  --  6.3 5.1  HGB 16.2 16.0 14.9 15.7  HCT 46.5 47.0 42.2 44.4  MCV 85.2  --  84.2 82.4  PLT 328  --  335 456*   Cardiac Enzymes: No results for input(s): "CKTOTAL", "CKMB", "CKMBINDEX", "TROPONINI" in the last 168 hours. BNP (last 3 results) No results for input(s): "BNP" in the last 8760 hours.  ProBNP (last 3 results) No results for input(s): "PROBNP" in the last 8760 hours.  CBG: Recent Labs  Lab 06/12/23 1142 06/12/23 2239 06/13/23 0818 06/13/23 1146  GLUCAP 264* 231* 198* 172*    Radiological Exams on Admission: No results found. Assessment/Plan Seon Kalmus is a 23 y.o. male with medical history significant for type 1 diabetes prior cannabinoid use being  admitted to the hospital with recurrent intractable nausea and vomiting.   Intractable nausea and vomiting-unclear etiology, could be related to cannabinoid hyperemesis versus gastroparesis given his history of type 1 diabetes.  No evidence of acute infection, patient's abdominal exam is quite benign so likely unnecessary to repeat CT scan today. -Observation admission -IV fluids -Carb modified diet as tolerated -Zofran  as needed, with IV Phenergan  for intractable symptoms  Type 1 diabetes-carb modified diet, with basal bolus insulin .  Basal insulin  resumed at reduced  dose.  DVT prophylaxis: Lovenox      Code Status: Full Code  Consults called: None  Admission status: Observation  Time spent: 49 minutes  Ladislao Cohenour Rickey Charm MD [facility] Pager [phone]  If 7PM-7AM, please contact night-coverage [email or web address] Password [email or web address]  06/18/2023, 1:54 PM

## 2023-06-18 NOTE — Telephone Encounter (Signed)
 the patient called regarding work excuse letter.  RNCM printed letter and left in lobby with registration for pt to pick up at his convenience.  Maurizio Geno J. Rachel Budds, RN, BSN, NCM  Transitions of Care  Nurse Case Manager  [facility] Emergency Departments  Operative Services  [phone]

## 2023-06-18 NOTE — ED Provider Notes (Signed)
 [facility] EMERGENCY DEPARTMENT AT [facility] Provider Note   CSN: [id number] Arrival date & time: 06/18/23  0707     History  Chief Complaint  Patient presents with   Abdominal Pain    Logan Washington is a 23 y.o. male.  Patient here with diffuse abdominal pain nausea.  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  Just admitted to the hospital last week feeling better but still with some symptoms this morning does not feel he can go to work.  Still with some diffuse pain.  He stopped smoking marijuana since his hospital admission.  Denies any weakness numbness tingling.  Denies any diarrhea.  Had unremarkable lab work and CT during hospital admission he states.  The history is provided by the patient.       Home Medications Prior to Admission medications   Medication Sig Start Date End Date Taking? Authorizing Provider  promethazine  (PHENERGAN ) 25 MG tablet Take 1 tablet (25 mg total) by mouth every 6 (six) hours as needed for nausea or vomiting. 06/18/23  Yes Kailin Leu, DO  acetaminophen  (TYLENOL ) 500 MG tablet Take 2 tablets (1,000 mg total) by mouth every 6 (six) hours as needed for mild pain (pain score 1-3) or moderate pain (pain score 4-6) (or Fever >/= 101). 04/08/23   Elwin Hammond, PA-C  LANTUS  SOLOSTAR 100 UNIT/ML Solostar Pen Inject 25 Units into the skin at bedtime. Patient taking differently: Inject 31 Units into the skin at bedtime. 01/25/18   Krishnan, Gokul, MD  NOVOLOG  FLEXPEN 100 UNIT/ML FlexPen Inject 0.5-12 Units into the skin 3 (three) times daily with meals. Sliding scale , Every 7 carbs =1 unit per patient 10/09/15   [provider]  ondansetron  (ZOFRAN ) 4 MG tablet Take 1 tablet (4 mg total) by mouth every 6 (six) hours as needed for nausea. 06/13/23   Aura Leeds Latif, DO      Allergies    Banana and Tilapia [fish allergy]    Review of Systems   Review of Systems  Physical Exam Updated Vital  Signs BP (!) 147/83   Pulse 77   Temp 97.7 F (36.5 C)   Resp 17   Ht 5\' 10"  (1.778 m)   Wt 75.8 kg   SpO2 100%   BMI 23.96 kg/m  Physical Exam Vitals and nursing note reviewed.  Constitutional:      General: He is not in acute distress.    Appearance: He is well-developed. He is not ill-appearing.  HENT:     Head: Normocephalic and atraumatic.     Mouth/Throat:     Mouth: Mucous membranes are moist.  Eyes:     Extraocular Movements: Extraocular movements intact.     Conjunctiva/sclera: Conjunctivae normal.     Pupils: Pupils are equal, round, and reactive to light.  Cardiovascular:     Rate and Rhythm: Normal rate and regular rhythm.     Heart sounds: Normal heart sounds. No murmur heard. Pulmonary:     Effort: Pulmonary effort is normal. No respiratory distress.     Breath sounds: Normal breath sounds.  Abdominal:     Palpations: Abdomen is soft.     Tenderness: There is no abdominal tenderness.  Musculoskeletal:        General: No swelling.     Cervical back: Neck supple.  Skin:    General: Skin is warm and dry.     Capillary Refill: Capillary refill takes less than 2 seconds.  Neurological:  General: No focal deficit present.     Mental Status: He is alert.  Psychiatric:        Mood and Affect: Mood normal.     ED Results / Procedures / Treatments   Labs (all labs ordered are listed, but only abnormal results are displayed) Labs Reviewed  CBC WITH DIFFERENTIAL/PLATELET - Abnormal; Notable for the following components:      Result Value   Platelets 456 (*)    All other components within normal limits  COMPREHENSIVE METABOLIC PANEL WITH GFR - Abnormal; Notable for the following components:   Potassium 3.4 (*)    Glucose, Bld 172 (*)    AST 14 (*)    All other components within normal limits  LIPASE, BLOOD    EKG EKG Interpretation Date/Time:  Wednesday Jun 18 2023 09:31:08 EDT Ventricular Rate:  76 PR Interval:  98 QRS Duration:  108 QT  Interval:  385 QTC Calculation: 433 R Axis:   78  Text Interpretation: Ectopic atrial rhythm Short PR interval Confirmed by Lowery Rue [phone]) on 06/18/2023 9:31:59 AM  Radiology No results found.  Procedures Procedures    Medications Ordered in ED Medications  promethazine  (PHENERGAN ) 25 MG/ML injection (  See Procedure Record 06/18/23 0947)  sodium chloride  0.9 % bolus 1,000 mL (0 mLs Intravenous Stopped 06/18/23 0942)  ondansetron  (ZOFRAN ) injection 4 mg (4 mg Intravenous Given 06/18/23 0755)  ondansetron  (ZOFRAN ) injection 4 mg (4 mg Intravenous Given 06/18/23 0832)  droperidol  (INAPSINE ) 2.5 MG/ML injection 1.25 mg (1.25 mg Intravenous Given 06/18/23 0832)  0.9 %  sodium chloride  infusion ( Intravenous New Bag/Given 06/18/23 1010)  pantoprazole  (PROTONIX ) injection 40 mg (40 mg Intravenous Given 06/18/23 0943)  promethazine  (PHENERGAN ) 25 mg in sodium chloride  0.9 % 50 mL IVPB (25 mg Intravenous New Bag/Given 06/18/23 0946)  sodium chloride  0.9 % bolus 1,000 mL (1,000 mLs Intravenous New Bag/Given 06/18/23 1308)    ED Course/ Medical Decision Making/ A&P                                 Medical Decision Making Amount and/or Complexity of Data Reviewed Labs: ordered.  Risk Prescription drug management. Decision regarding hospitalization.   Logan Washington is here with abdominal pain nausea.  Normal vitals.  No fever.  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  Overall appears well.  No focal abdominal tenderness on exam.  Seems like he is motivated to stop smoking but obviously I think THC still probably in his system.  He is fairly minimally symptomatic.  Will check labs to make sure he is not in DKA I have no concern for appendicitis or other acute process on my differential at this time.  Likely will provide Phenergan  prescription to have further support at home but will provide work note.  Will reevaluate.  But at this time I do not think we  need to do any advanced imaging.  Lab work is unremarkable.  Still symptomatic after Zofran  x 2.  Will give droperidol .  Reevaluated again and still does not feel comfortable still feeling nausea still vomiting.  Patient would like to be admitted cannot keep anything down.  Will try some Protonix  and a different antiemetic probably Phenergan  dependent on EKG shows me and see if I can get him feeling better.  I continue to not have major concern about his abdomen is not having any abdominal  pain no tenderness on exam.  He is just having ongoing nausea and vomiting.  I do suspect likely secondary to gastroparesis from diabetes or from cyclical vomting from marijuana use.  Overall still symptomatic despite multiple rounds of antiemetics.  He still feels intensely nauseous at times cannot keep things down cannot get him to tolerate p.o. but at the same time he is not having any abdominal pain and that abdominal exam is benign.  I do not think we need to do a new CT scan he just had one a couple days ago.  Overall at this time he is requesting admission which I think is reasonable given that I have not had any major success given his symptoms better here despite multiple medications.  This chart was dictated using voice recognition software.  Despite best efforts to proofread,  errors can occur which can change the documentation meaning.         Final Clinical Impression(s) / ED Diagnoses Final diagnoses:  Abdominal pain, unspecified abdominal location  Nausea    Rx / DC Orders ED Discharge Orders          Ordered    promethazine  (PHENERGAN ) 25 MG tablet  Every 6 hours PRN        06/18/23 0743              Lowery Rue, DO 06/18/23 1048

## 2023-06-18 NOTE — ED Triage Notes (Signed)
 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 States woke up this morning still feeling nauseous and generalized abd pain.   Denies emesis since this morning

## 2023-06-19 ENCOUNTER — Other Ambulatory Visit (HOSPITAL_BASED_OUTPATIENT_CLINIC_OR_DEPARTMENT_OTHER): Payer: Self-pay

## 2023-06-19 DIAGNOSIS — E119 Type 2 diabetes mellitus without complications: Secondary | ICD-10-CM

## 2023-06-19 DIAGNOSIS — R110 Nausea: Secondary | ICD-10-CM

## 2023-06-19 DIAGNOSIS — R112 Nausea with vomiting, unspecified: Secondary | ICD-10-CM

## 2023-06-19 DIAGNOSIS — R109 Unspecified abdominal pain: Secondary | ICD-10-CM | POA: Diagnosis not present

## 2023-06-19 DIAGNOSIS — R1110 Vomiting, unspecified: Secondary | ICD-10-CM | POA: Diagnosis not present

## 2023-06-19 LAB — BASIC METABOLIC PANEL WITH GFR
Anion gap: 9 (ref 5–15)
BUN: 6 mg/dL (ref 6–20)
CO2: 27 mmol/L (ref 22–32)
Calcium: 8.4 mg/dL — ABNORMAL LOW (ref 8.9–10.3)
Chloride: 102 mmol/L (ref 98–111)
Creatinine, Ser: 0.78 mg/dL (ref 0.61–1.24)
GFR, Estimated: >60 mL/min (ref >60)
Glucose, Bld: 61 mg/dL — ABNORMAL LOW (ref 70–99)
Potassium: 2.7 mmol/L — CL (ref 3.5–5.1)
Sodium: 138 mmol/L (ref 135–145)

## 2023-06-19 LAB — CBC
HCT: 41.8 % (ref 39.0–52.0)
Hemoglobin: 14.6 g/dL (ref 13.0–17.0)
MCH: 30 pg (ref 26.0–34.0)
MCHC: 34.9 g/dL (ref 30.0–36.0)
MCV: 86 fL (ref 80.0–100.0)
Platelets: 381 10*3/uL (ref 150–400)
RBC: 4.86 MIL/uL (ref 4.22–5.81)
RDW: 12.3 % (ref 11.5–15.5)
WBC: 8.5 10*3/uL (ref 4.0–10.5)
nRBC: 0 % (ref 0.0–0.2)

## 2023-06-19 LAB — GLUCOSE, CAPILLARY
Glucose-Capillary: 57 mg/dL — ABNORMAL LOW (ref 70–99)
Glucose-Capillary: 52 mg/dL — ABNORMAL LOW (ref 70–99)
Glucose-Capillary: 82 mg/dL (ref 70–99)
Glucose-Capillary: 109 mg/dL — ABNORMAL HIGH (ref 70–99)

## 2023-06-19 LAB — POTASSIUM: Potassium: 3.7 mmol/L (ref 3.5–5.1)

## 2023-06-19 MED ORDER — POTASSIUM CHLORIDE CRYS ER 20 MEQ PO TBCR: 20.0000 meq | EXTENDED_RELEASE_TABLET | Freq: Once | ORAL | Status: DC

## 2023-06-19 MED ORDER — POTASSIUM CHLORIDE 10 MEQ/100ML IV SOLN
10.0000 meq | INTRAVENOUS | Status: AC
  Administered 2023-06-19 (×2): 10 meq via INTRAVENOUS
  Filled 2023-06-19: qty 100

## 2023-06-19 MED ORDER — POTASSIUM CHLORIDE CRYS ER 20 MEQ PO TBCR
20.0000 meq | EXTENDED_RELEASE_TABLET | Freq: Once | ORAL | Status: AC
  Administered 2023-06-19: 20 meq via ORAL
  Filled 2023-06-19: qty 1

## 2023-06-19 MED ORDER — POTASSIUM CHLORIDE CRYS ER 20 MEQ PO TBCR
40.0000 meq | EXTENDED_RELEASE_TABLET | Freq: Once | ORAL | Status: AC
  Administered 2023-06-19: 40 meq via ORAL
  Filled 2023-06-19: qty 2

## 2023-06-19 MED ORDER — INSULIN ASPART 100 UNIT/ML IJ SOLN: 0.0000 [IU] | INTRAMUSCULAR | Status: DC

## 2023-06-19 MED ORDER — LANTUS SOLOSTAR 100 UNIT/ML ~~LOC~~ SOPN
15.0000 [IU] | PEN_INJECTOR | Freq: Every day | SUBCUTANEOUS | 5 refills | Status: DC
  Filled 2023-06-19: qty 3, 20d supply, fill #0

## 2023-06-19 NOTE — Progress Notes (Signed)
 RN discharged per MD orders for pt to go to GI appointment in [facility]. Pt will return to obtain discharge instructions and PO Zofran  from pharmacy at the [facility]. MD aware. Pt's needed to get [facility] [employer] waiting outside to take pt to [facility] to pick up his car for 1345 MD appointment.

## 2023-06-19 NOTE — Discharge Instructions (Signed)
 Kramer It was a pleasure taking care of you at [facility]. You were admitted for nausea and vomiting. We are discharging you home now that you are doing better. Please follow the following instructions.  1) Decrease your Lantus  to 15 units at bedtime and continue your Novolog  sliding scale 2) THC can cause intractable nausea and vomiting so I recommend you quit or limit your use 3) Please make sure to go to your Endocrinology appt today and follow up with your PCP in 1 week.   Take care,  Dr. Redge Cancel, MD, MPH

## 2023-06-19 NOTE — Discharge Summary (Addendum)
 Physician Discharge Summary   Patient: Logan Washington MRN: [id number] DOB: 2000/06/05  Admit date:     06/18/2023  Discharge date: 06/19/23  Discharge Physician: Vita Grip   PCP: Carlynn Chiles, PA-C   Recommendations at discharge:   Follow-up potassium and replete as needed Adjust insulin  regimen as needed Ensure patient has followed up with endocrinology as scheduled  Discharge Diagnoses: Principal Problem:   Intractable vomiting  Resolved Problems:   * No resolved hospital problems. *   Hospital Course: Logan Washington is a 23 y.o. male with medical history significant for type 1 diabetes prior cannabinoid use being admitted to the hospital with recurrent intractable nausea and vomiting.  He was recently hospitalized at [facility] from 5/1 to 5/2 for intractable nausea and vomiting with associated dehydration.  Patient states he was doing well until about 2 days ago, when he had to go to [facility] ED with continued symptoms of cyclic vomiting.  He denies any fevers, has some generalized abdominal pain which is worse than previously but otherwise very reminiscent of his previous abdominal pain with vomiting.  Denies any hematemesis, states he is constipated which is quite typical for him.  Has otherwise been compliant with his medications denies any cough, denies any recent cannabinoid use.  Has been in the emergency department all morning, unable to stop vomiting and so hospitalist admission was requested.  Symptoms improved the next day and patient was discharged to follow-up with endocrinology as scheduled.  Assessment and Plan: Intractable nausea and vomiting-unclear etiology, could be related to cannabinoid hyperemesis versus gastroparesis given his history of type 1 diabetes. No evidence of acute infection, patient's abdominal exam is quite benign so likely unnecessary to repeat CT scan today. Symptoms significantly improved on second day of admission.   Type 1  diabetes- A1c of 8.7% on 5/5.  Reports having hypoglycemic episodes at night while at home.  Lantus  recently increased from 28 to 32 units at bedtime.  Continued to have hypoglycemic episodes with CBGs in the 50s during hospitalization.  Lantus  decreased to 15 units at bedtime at discharge.  Patient has an appointment with endocrinology later today and advised to follow-up for adjustment to his insulin  regimen.  Hypokalemia Potassium dropped from 3.4 on day of admission to 2.7 prior to discharge.  This was repleted with a total of 90 mEq KCl prior to discharge. Repeat potassium pending at discharge.        Consultants: None Procedures performed: None  Disposition: Home Diet recommendation:  Carb modified diet  DISCHARGE MEDICATION: Allergies as of 06/19/2023       Reactions   Banana Anaphylaxis   Tilapia [fish Allergy] Swelling        Medication List     TAKE these medications    acetaminophen  500 MG tablet Commonly known as: TYLENOL  Take 2 tablets (1,000 mg total) by mouth every 6 (six) hours as needed for mild pain (pain score 1-3) or moderate pain (pain score 4-6) (or Fever >/= 101).   Baqsimi One Pack 3 MG/DOSE Powd Generic drug: Glucagon Place 1 spray into both nostrils as needed (Hypoglycemia).   Lantus  SoloStar 100 UNIT/ML Solostar Pen Generic drug: insulin  glargine Inject 15 Units into the skin at bedtime. What changed: how much to take   NovoLOG  FlexPen 100 UNIT/ML FlexPen Generic drug: insulin  aspart Inject 0.5-12 Units into the skin 3 (three) times daily with meals. Sliding scale , Every 7 carbs =1 unit per patient   ondansetron  4 MG  tablet Commonly known as: ZOFRAN  Take 1 tablet (4 mg total) by mouth every 6 (six) hours as needed for nausea.   promethazine  25 MG tablet Commonly known as: PHENERGAN  Take 1 tablet (25 mg total) by mouth every 6 (six) hours as needed for nausea or vomiting.        Follow-up Information     Carlynn Chiles, PA-C.  Schedule an appointment as soon as possible for a visit in 1 week(s).   Specialty: Family Medicine Contact information: [address] [place] [address] [phone]                Subjective: Patient evaluated laying comfortably in bed.  Reports he feels much better.  He denies any nausea, vomiting or abdominal pain.  States he has a history of hypoglycemia especially at night.  Reports he has endocrinology appointment later this afternoon and would like to get to this appointment.  Discussed plan for discharge for patient to follow-up endocrinology at 145 as scheduled.  Discharge Exam: Filed Weights   06/18/23 0717 06/18/23 1454  Weight: 75.8 kg 75.8 kg   General: Pleasant, well-appearing young man laying in bed. No acute distress. HEENT: [place]/AT. Anicteric sclera CV: RRR. No murmurs, rubs, or gallops. No LE edema Pulmonary: Lungs CTAB. Normal effort. No wheezing or rales. Abdominal: Soft, nontender, nondistended. Normal bowel sounds. Extremities: Palpable radial and DP pulses. Normal ROM. Skin: Warm and dry. No obvious rash or lesions. Neuro: A&Ox3. Moves all extremities. Normal sensation to light touch. No focal deficit. Psych: Normal mood and affect   Condition at discharge: good  The results of significant diagnostics from this hospitalization (including imaging, microbiology, ancillary and laboratory) are listed below for reference.   Imaging Studies: CT ABDOMEN PELVIS W CONTRAST Result Date: 06/12/2023 CLINICAL DATA:  Abdominal pain, acute, nonlocalized, seizures EXAM: CT ABDOMEN AND PELVIS WITH CONTRAST TECHNIQUE: Multidetector CT imaging of the abdomen and pelvis was performed using the standard protocol following bolus administration of intravenous contrast. RADIATION DOSE REDUCTION: This exam was performed according to the departmental dose-optimization program which includes automated exposure control, adjustment of the mA and/or kV according to  patient size and/or use of iterative reconstruction technique. CONTRAST:  75mL OMNIPAQUE  IOHEXOL  350 MG/ML SOLN COMPARISON:  April 18, 2023 FINDINGS: Lower chest: No focal airspace consolidation or pleural effusion. Hepatobiliary: No mass. No radiopaque stones or wall thickening of the gallbladder. No intrahepatic or extrahepatic biliary ductal dilation. The portal veins are patent. Pancreas: No mass or main ductal dilation. No peripancreatic inflammation or fluid collection. Spleen: Normal size. No mass. Adrenals/Urinary Tract: No adrenal masses. No renal mass. No nephrolithiasis or hydronephrosis. The urinary bladder is distended without focal abnormality. Stomach/Bowel: The stomach is decompressed without focal abnormality. No small bowel wall thickening or inflammation. No small bowel obstruction. Appendectomy. Vascular/Lymphatic: No aortic aneurysm. No intraabdominal or pelvic lymphadenopathy. Reproductive: No prostatomegaly. No free pelvic fluid. Other: No pneumoperitoneum, ascites, or mesenteric inflammation. Musculoskeletal: No acute fracture or destructive lesion. IMPRESSION: No acute intra-abdominal or pelvic abnormality. Electronically Signed   By: Rance Burrows M.D.   On: 06/12/2023 15:48    Microbiology: Results for orders placed or performed during the hospital encounter of 04/07/23  Resp panel by RT-PCR (RSV, Flu A&B, Covid) Anterior Nasal Swab     Status: Abnormal   Collection Time: 04/07/23  3:00 PM   Specimen: Anterior Nasal Swab  Result Value Ref Range Status   SARS Coronavirus 2 by RT PCR POSITIVE (A) NEGATIVE  Final    Comment: (NOTE) SARS-CoV-2 target nucleic acids are DETECTED.  The SARS-CoV-2 RNA is generally detectable in upper respiratory specimens during the acute phase of infection. Positive results are indicative of the presence of the identified virus, but do not rule out bacterial infection or co-infection with other pathogens not detected by the test. Clinical  correlation with patient history and other diagnostic information is necessary to determine patient infection status. The expected result is Negative.  Fact Sheet for Patients: BloggerCourse.com  Fact Sheet for Healthcare Providers: SeriousBroker.it  This test is not yet approved or cleared by the [place]  FDA and  has been authorized for detection and/or diagnosis of SARS-CoV-2 by FDA under an Emergency Use Authorization (EUA).  This EUA will remain in effect (meaning this test can be used) for the duration of  the COVID-19 declaration under Section 564(b)(1) of the A ct, 21 U.S.C. section 360bbb-3(b)(1), unless the authorization is terminated or revoked sooner.     Influenza A by PCR NEGATIVE NEGATIVE Final   Influenza B by PCR NEGATIVE NEGATIVE Final    Comment: (NOTE) The Xpert Xpress SARS-CoV-2/FLU/RSV plus assay is intended as an aid in the diagnosis of influenza from Nasopharyngeal swab specimens and should not be used as a sole basis for treatment. Nasal washings and aspirates are unacceptable for Xpert Xpress SARS-CoV-2/FLU/RSV testing.  Fact Sheet for Patients: BloggerCourse.com  Fact Sheet for Healthcare Providers: SeriousBroker.it  This test is not yet approved or cleared by the [place]  FDA and has been authorized for detection and/or diagnosis of SARS-CoV-2 by FDA under an Emergency Use Authorization (EUA). This EUA will remain in effect (meaning this test can be used) for the duration of the COVID-19 declaration under Section 564(b)(1) of the Act, 21 U.S.C. section 360bbb-3(b)(1), unless the authorization is terminated or revoked.     Resp Syncytial Virus by PCR NEGATIVE NEGATIVE Final    Comment: (NOTE) Fact Sheet for Patients: BloggerCourse.com  Fact Sheet for Healthcare  Providers: SeriousBroker.it  This test is not yet approved or cleared by the [place]  FDA and has been authorized for detection and/or diagnosis of SARS-CoV-2 by FDA under an Emergency Use Authorization (EUA). This EUA will remain in effect (meaning this test can be used) for the duration of the COVID-19 declaration under Section 564(b)(1) of the Act, 21 U.S.C. section 360bbb-3(b)(1), unless the authorization is terminated or revoked.  Performed at [facility], [address] [place], [place] [address]     Labs: CBC: Recent Labs  Lab 06/12/23 1232 06/13/23 0954 06/18/23 0736 06/19/23 0453  WBC  --  9.0 7.6 8.5  NEUTROABS  --  6.3 5.1  --   HGB 16.0 14.9 15.7 14.6  HCT 47.0 42.2 44.4 41.8  MCV  --  84.2 82.4 86.0  PLT  --  335 456* 381   Basic Metabolic Panel: Recent Labs  Lab 06/12/23 1232 06/12/23 1820 06/13/23 0954 06/18/23 0736 06/19/23 0453  NA 136 137  --  138 138  K 4.9 4.4  --  3.4* 2.7*  CL  --  100  --  100 102  CO2  --  25  --  28 27  GLUCOSE  --  251*  --  172* 61*  BUN  --  12  --  8 6  CREATININE  --  0.99  --  0.88 0.78  CALCIUM  --  9.3  --  9.1 8.4*  MG  --   --  1.5*  --   --   PHOS  --   --  3.0  --   --    Liver Function Tests: Recent Labs  Lab 06/13/23 0954 06/18/23 0736  AST 20 14*  ALT 20 13  ALKPHOS 57 63  BILITOT 2.0* 1.2  PROT 6.2* 7.1  ALBUMIN 3.3* 4.6   CBG: Recent Labs  Lab 06/18/23 2006 06/19/23 0722 06/19/23 0755 06/19/23 0851 06/19/23 1157  GLUCAP 73 57* 52* 82 109*    Discharge time spent: greater than 30 minutes.  Signed: Vita Grip, MD [facility] 06/19/2023

## 2023-06-19 NOTE — Inpatient Diabetes Management (Signed)
 Inpatient Diabetes Program Recommendations  AACE/ADA: New Consensus Statement on Inpatient Glycemic Control (2015)  Target Ranges:  Prepandial:   less than 140 mg/dL      Peak postprandial:   less than 180 mg/dL (1-2 hours)      Critically ill patients:  140 - 180 mg/dL   Lab Results  Component Value Date   GLUCAP 82 06/19/2023   HGBA1C 7.3 (H) 04/08/2023    Review of Glycemic Control  Latest Reference Range & Units 06/18/23 14:25 06/18/23 16:06 06/18/23 20:06 06/18/23 21:04 06/19/23 07:22 06/19/23 07:55 06/19/23 08:51  Glucose-Capillary 70 - 99 mg/dL 161 (H)  Semglee  8 units 205 (H)  Novolog  5 units 73   Semglee  8 units 57 (L) 52 (L) 82  (H): Data is abnormally high (L): Data is abnormally low  Diabetes history: T1DM Outpatient Diabetes medications:  Lantus  30 units every day Novolog  5-12 units TID Novolog  1 unit for every 7 CHO's Current orders for Inpatient glycemic control:  Semglee  8 BID Novolog  0-15 units TID and 0-5 units QHS  Inpatient Diabetes Program Recommendations:    Hypoglycemia this morning.  Please consider reducing correction scale to:  Novolog  0-9 units TID   Will continue to follow while inpatient.  Thank you, Hays Lipschutz, MSN, CDCES Diabetes Coordinator Inpatient Diabetes Program [phone] (team pager from 8a-5p)

## 2023-06-19 NOTE — TOC Initial Note (Signed)
 Transition of Care [facility]) - Initial/Assessment Note    Patient Details  Name: Logan Washington MRN: [id number] Date of Birth: 2000-11-03  [facility] CM/SW Contact:    Kathryn Parish, RN Phone Number: 06/19/2023, 11:29 AM  Clinical Narrative:                 Spoke to pt in room; pt says he lives in a house w/ his aunt Darlene Ehlers; pt says he plans to return at d/c; he verified insurance/PCP; he denies SDOH risks; pt says he does not have DME, HH services, or home oxygen; He will call an uber to take him to his vehicle at [employer]. no TOC needs; TOC signing off; please place consult if needed  Patient Goals and CMS Choice            Expected Discharge Plan and Services                                              Prior Living Arrangements/Services                       Activities of Daily Living   ADL Screening (condition at time of admission) Independently performs ADLs?: Yes (appropriate for developmental age) Is the patient deaf or have difficulty hearing?: No Does the patient have difficulty seeing, even when wearing glasses/contacts?: No Does the patient have difficulty concentrating, remembering, or making decisions?: No  Permission Sought/Granted                  Emotional Assessment              Admission diagnosis:  Nausea [R11.0] Intractable vomiting [R11.10] Abdominal pain, unspecified abdominal location [R10.9] Patient Active Problem List   Diagnosis Date Noted   Intractable vomiting 06/18/2023   Intractable nausea and vomiting 06/12/2023   Appendicitis 04/07/2023   Lab test positive for detection of COVID-19 virus 04/07/2023   Esophageal thickening 04/07/2023   Odynophagia 03/17/2021   Hypokalemia    DKA (diabetic ketoacidosis) (HCC) 03/15/2021   Dehydration 03/15/2021   Hematemesis 03/15/2021   Leukocytosis 03/15/2021   Hyperkalemia 03/15/2021   Influenza A 01/24/2018   Abdominal pain 01/24/2018   DKA,  type 1 (HCC) 01/23/2018   AKI (acute kidney injury) (HCC) 01/23/2018   Abnormal LFTs 01/23/2018   Chest pain 01/23/2018   Diabetes mellitus without complication (HCC)    Sports physical 10/25/2015   PCP:  Carlynn Chiles, PA-C Pharmacy:   [id number] [place], [place] - [address] AT ACROSS FROM [address] [place] [address] Phone: [phone] Fax: [phone]  [facility] [place] [facility] [address] [place] [address] Phone: [phone] Fax: [phone]     Social Drivers of Health (SDOH) Social History: SDOH Screenings   Food Insecurity: No Food Insecurity (06/18/2023)  Housing: Low Risk  (06/18/2023)  Transportation Needs: No Transportation Needs (06/18/2023)  Recent Concern: Transportation Needs - Unmet Transportation Needs (04/19/2023)   Received from [facility]  Utilities: Not At Risk (06/18/2023)  Social Connections: Unknown (06/26/2021)   Received from [facility]  Tobacco Use: High Risk (06/18/2023)   SDOH Interventions:     Readmission Risk Interventions     No data to display

## 2023-06-19 NOTE — Progress Notes (Signed)
 Potassium is 2.7. Guillermo Lees, NP notified. Care plan continued

## 2023-06-20 NOTE — Progress Notes (Signed)
 Late note - pt came to [facility] to pick up discharge instructions from nurses' station. End of note

## 2023-06-20 NOTE — Progress Notes (Deleted)
 Late note. Pt came 05/9//25 at 0700 to pick up discharge papers from nurse's

## 2023-06-30 ENCOUNTER — Other Ambulatory Visit (HOSPITAL_BASED_OUTPATIENT_CLINIC_OR_DEPARTMENT_OTHER): Payer: Self-pay

## 2023-07-22 ENCOUNTER — Ambulatory Visit (HOSPITAL_COMMUNITY): Payer: Self-pay | Admitting: Mental Health

## 2023-07-29 ENCOUNTER — Encounter (HOSPITAL_BASED_OUTPATIENT_CLINIC_OR_DEPARTMENT_OTHER): Payer: Self-pay | Admitting: Emergency Medicine

## 2023-07-29 ENCOUNTER — Emergency Department (HOSPITAL_BASED_OUTPATIENT_CLINIC_OR_DEPARTMENT_OTHER)

## 2023-07-29 ENCOUNTER — Other Ambulatory Visit: Payer: Self-pay

## 2023-07-29 ENCOUNTER — Observation Stay (HOSPITAL_BASED_OUTPATIENT_CLINIC_OR_DEPARTMENT_OTHER): Admission: EM | Admit: 2023-07-29 | Discharge: 2023-07-30 | Disposition: A | Discharge status: Home / Self Care

## 2023-07-29 DIAGNOSIS — E1110 Type 2 diabetes mellitus with ketoacidosis without coma: Secondary | ICD-10-CM | POA: Diagnosis present

## 2023-07-29 DIAGNOSIS — R1110 Vomiting, unspecified: Secondary | ICD-10-CM

## 2023-07-29 DIAGNOSIS — R1084 Generalized abdominal pain: Secondary | ICD-10-CM | POA: Diagnosis not present

## 2023-07-29 DIAGNOSIS — E1310 Other specified diabetes mellitus with ketoacidosis without coma: Principal | ICD-10-CM

## 2023-07-29 DIAGNOSIS — F17210 Nicotine dependence, cigarettes, uncomplicated: Secondary | ICD-10-CM | POA: Diagnosis not present

## 2023-07-29 DIAGNOSIS — E1010 Type 1 diabetes mellitus with ketoacidosis without coma: Principal | ICD-10-CM | POA: Insufficient documentation

## 2023-07-29 DIAGNOSIS — F12920 Cannabis use, unspecified with intoxication, uncomplicated: Secondary | ICD-10-CM | POA: Diagnosis not present

## 2023-07-29 DIAGNOSIS — R197 Diarrhea, unspecified: Secondary | ICD-10-CM | POA: Insufficient documentation

## 2023-07-29 DIAGNOSIS — R112 Nausea with vomiting, unspecified: Secondary | ICD-10-CM | POA: Insufficient documentation

## 2023-07-29 DIAGNOSIS — R109 Unspecified abdominal pain: Secondary | ICD-10-CM | POA: Diagnosis present

## 2023-07-29 LAB — CBC WITH DIFFERENTIAL/PLATELET
Abs Immature Granulocytes: 0.07 10*3/uL (ref 0.00–0.07)
Basophils Absolute: 0 10*3/uL (ref 0.0–0.1)
Basophils Relative: 0 %
Eosinophils Absolute: 0 10*3/uL (ref 0.0–0.5)
Eosinophils Relative: 0 %
HCT: 47.3 % (ref 39.0–52.0)
Hemoglobin: 16.5 g/dL (ref 13.0–17.0)
Immature Granulocytes: 1 %
Lymphocytes Relative: 6 %
Lymphs Abs: 0.9 10*3/uL (ref 0.7–4.0)
MCH: 29.5 pg (ref 26.0–34.0)
MCHC: 34.9 g/dL (ref 30.0–36.0)
MCV: 84.6 fL (ref 80.0–100.0)
Monocytes Absolute: 0.3 10*3/uL (ref 0.1–1.0)
Monocytes Relative: 2 %
Neutro Abs: 13.5 10*3/uL — ABNORMAL HIGH (ref 1.7–7.7)
Neutrophils Relative %: 91 %
Platelets: 368 10*3/uL (ref 150–400)
RBC: 5.59 MIL/uL (ref 4.22–5.81)
RDW: 12.3 % (ref 11.5–15.5)
WBC: 14.7 10*3/uL — ABNORMAL HIGH (ref 4.0–10.5)
nRBC: 0 % (ref 0.0–0.2)

## 2023-07-29 LAB — URINE DRUG SCREEN
Amphetamines: NOT DETECTED
Barbiturates: NOT DETECTED
Benzodiazepines: NOT DETECTED
Cocaine: NOT DETECTED
Fentanyl: NOT DETECTED
Methadone Scn, Ur: NOT DETECTED
Opiates: NOT DETECTED
Tetrahydrocannabinol: DETECTED — AB

## 2023-07-29 LAB — COMPREHENSIVE METABOLIC PANEL WITH GFR
ALT: 14 U/L (ref 0–44)
AST: 22 U/L (ref 15–41)
Albumin: 4.9 g/dL (ref 3.5–5.0)
Alkaline Phosphatase: 115 U/L (ref 38–126)
Anion gap: 23 — ABNORMAL HIGH (ref 5–15)
BUN: 17 mg/dL (ref 6–20)
CO2: 20 mmol/L — ABNORMAL LOW (ref 22–32)
Calcium: 10.1 mg/dL (ref 8.9–10.3)
Chloride: 93 mmol/L — ABNORMAL LOW (ref 98–111)
Creatinine, Ser: 1.16 mg/dL (ref 0.61–1.24)
GFR, Estimated: >60 mL/min (ref >60)
Glucose, Bld: 424 mg/dL — ABNORMAL HIGH (ref 70–99)
Potassium: 5.2 mmol/L — ABNORMAL HIGH (ref 3.5–5.1)
Sodium: 136 mmol/L (ref 135–145)
Total Bilirubin: 2.1 mg/dL — ABNORMAL HIGH (ref 0.0–1.2)
Total Protein: 7.9 g/dL (ref 6.5–8.1)

## 2023-07-29 LAB — I-STAT VENOUS BLOOD GAS, ED
Acid-base deficit: 3 mmol/L — ABNORMAL HIGH (ref 0.0–2.0)
Bicarbonate: 22 mmol/L (ref 20.0–28.0)
Calcium, Ion: 1.09 mmol/L — ABNORMAL LOW (ref 1.15–1.40)
HCT: 50 % (ref 39.0–52.0)
Hemoglobin: 17 g/dL (ref 13.0–17.0)
O2 Saturation: 75 %
Patient temperature: 97.2
Potassium: 5 mmol/L (ref 3.5–5.1)
Sodium: 134 mmol/L — ABNORMAL LOW (ref 135–145)
TCO2: 23 mmol/L (ref 22–32)
pCO2, Ven: 37.4 mmHg — ABNORMAL LOW (ref 44–60)
pH, Ven: 7.374 (ref 7.25–7.43)
pO2, Ven: 39 mmHg (ref 32–45)

## 2023-07-29 LAB — BASIC METABOLIC PANEL WITH GFR
Anion gap: 25 — ABNORMAL HIGH (ref 5–15)
Anion gap: 14 (ref 5–15)
BUN: 18 mg/dL (ref 6–20)
BUN: 18 mg/dL (ref 6–20)
CO2: 16 mmol/L — ABNORMAL LOW (ref 22–32)
CO2: 19 mmol/L — ABNORMAL LOW (ref 22–32)
Calcium: 9.6 mg/dL (ref 8.9–10.3)
Calcium: 9.7 mg/dL (ref 8.9–10.3)
Chloride: 95 mmol/L — ABNORMAL LOW (ref 98–111)
Chloride: 103 mmol/L (ref 98–111)
Creatinine, Ser: 0.99 mg/dL (ref 0.61–1.24)
Creatinine, Ser: 0.98 mg/dL (ref 0.61–1.24)
GFR, Estimated: >60 mL/min (ref >60)
GFR, Estimated: >60 mL/min (ref >60)
Glucose, Bld: 397 mg/dL — ABNORMAL HIGH (ref 70–99)
Glucose, Bld: 188 mg/dL — ABNORMAL HIGH (ref 70–99)
Potassium: 5.1 mmol/L (ref 3.5–5.1)
Potassium: 3.7 mmol/L (ref 3.5–5.1)
Sodium: 136 mmol/L (ref 135–145)
Sodium: 136 mmol/L (ref 135–145)

## 2023-07-29 LAB — URINALYSIS, ROUTINE W REFLEX MICROSCOPIC
Bilirubin Urine: NEGATIVE
Glucose, UA: >=500 mg/dL — AB
Hgb urine dipstick: NEGATIVE
Ketones, ur: >=80 mg/dL — AB
Leukocytes,Ua: NEGATIVE
Nitrite: NEGATIVE
Protein, ur: NEGATIVE mg/dL
Specific Gravity, Urine: 1.015 (ref 1.005–1.030)
pH: 5.5 (ref 5.0–8.0)

## 2023-07-29 LAB — GLUCOSE, CAPILLARY
Glucose-Capillary: 145 mg/dL — ABNORMAL HIGH (ref 70–99)
Glucose-Capillary: 156 mg/dL — ABNORMAL HIGH (ref 70–99)
Glucose-Capillary: 172 mg/dL — ABNORMAL HIGH (ref 70–99)
Glucose-Capillary: 182 mg/dL — ABNORMAL HIGH (ref 70–99)
Glucose-Capillary: 184 mg/dL — ABNORMAL HIGH (ref 70–99)
Glucose-Capillary: 194 mg/dL — ABNORMAL HIGH (ref 70–99)
Glucose-Capillary: 227 mg/dL — ABNORMAL HIGH (ref 70–99)

## 2023-07-29 LAB — CBG MONITORING, ED
Glucose-Capillary: 383 mg/dL — ABNORMAL HIGH (ref 70–99)
Glucose-Capillary: 384 mg/dL — ABNORMAL HIGH (ref 70–99)
Glucose-Capillary: 324 mg/dL — ABNORMAL HIGH (ref 70–99)

## 2023-07-29 LAB — URINALYSIS, MICROSCOPIC (REFLEX)

## 2023-07-29 LAB — MRSA NEXT GEN BY PCR, NASAL: MRSA by PCR Next Gen: NOT DETECTED

## 2023-07-29 LAB — BETA-HYDROXYBUTYRIC ACID: Beta-Hydroxybutyric Acid: 3.04 mmol/L — ABNORMAL HIGH (ref 0.05–0.27)

## 2023-07-29 LAB — LIPASE, BLOOD: Lipase: 16 U/L (ref 11–51)

## 2023-07-29 MED ORDER — DEXTROSE 50 % IV SOLN: 0.0000 mL | INTRAVENOUS | Status: DC | PRN

## 2023-07-29 MED ORDER — SENNOSIDES-DOCUSATE SODIUM 8.6-50 MG PO TABS: 1.0000 | ORAL_TABLET | Freq: Every evening | ORAL | Status: DC | PRN

## 2023-07-29 MED ORDER — INSULIN REGULAR(HUMAN) IN NACL 100-0.9 UT/100ML-% IV SOLN
INTRAVENOUS | Status: AC
  Administered 2023-07-29: 11 [IU]/h via INTRAVENOUS
  Filled 2023-07-29: qty 100

## 2023-07-29 MED ORDER — LACTATED RINGERS IV BOLUS
1000.0000 mL | Freq: Once | INTRAVENOUS | Status: AC
  Administered 2023-07-29: 1000 mL via INTRAVENOUS

## 2023-07-29 MED ORDER — IOHEXOL 300 MG/ML  SOLN
100.0000 mL | Freq: Once | INTRAMUSCULAR | Status: AC | PRN
Start: 2023-07-29 — End: 2023-07-29
  Administered 2023-07-29: 100 mL via INTRAVENOUS

## 2023-07-29 MED ORDER — ENOXAPARIN SODIUM 40 MG/0.4ML IJ SOSY
40.0000 mg | PREFILLED_SYRINGE | Freq: Every day | INTRAMUSCULAR | Status: DC
  Administered 2023-07-29: 40 mg via SUBCUTANEOUS
  Filled 2023-07-29: qty 0.4

## 2023-07-29 MED ORDER — ONDANSETRON HCL 4 MG/2ML IJ SOLN
4.0000 mg | Freq: Once | INTRAMUSCULAR | Status: AC
  Administered 2023-07-29: 4 mg via INTRAVENOUS
  Filled 2023-07-29: qty 2

## 2023-07-29 MED ORDER — ORAL CARE MOUTH RINSE
15.0000 mL | OROMUCOSAL | Status: DC | PRN
Start: 2023-07-29 — End: 2023-07-30
  Filled 2023-07-29: qty 15

## 2023-07-29 MED ORDER — HYDROMORPHONE HCL 1 MG/ML IJ SOLN
0.5000 mg | Freq: Four times a day (QID) | INTRAMUSCULAR | Status: DC | PRN
  Administered 2023-07-29: 0.5 mg via INTRAVENOUS
  Filled 2023-07-29: qty 1

## 2023-07-29 MED ORDER — SODIUM CHLORIDE 0.9 % IV SOLN: INTRAVENOUS | Status: DC | PRN

## 2023-07-29 MED ORDER — LACTATED RINGERS IV SOLN: INTRAVENOUS | Status: DC

## 2023-07-29 MED ORDER — FAMOTIDINE IN NACL 20-0.9 MG/50ML-% IV SOLN
20.0000 mg | Freq: Once | INTRAVENOUS | Status: AC
  Administered 2023-07-29: 20 mg via INTRAVENOUS
  Filled 2023-07-29: qty 50

## 2023-07-29 MED ORDER — MELATONIN 5 MG PO TABS
5.0000 mg | ORAL_TABLET | Freq: Every evening | ORAL | Status: AC | PRN
  Administered 2023-07-29: 5 mg via ORAL
  Filled 2023-07-29: qty 1

## 2023-07-29 MED ORDER — PROMETHAZINE HCL 25 MG/ML IJ SOLN
INTRAMUSCULAR | Status: AC
  Filled 2023-07-29: qty 1

## 2023-07-29 MED ORDER — DEXTROSE IN LACTATED RINGERS 5 % IV SOLN: INTRAVENOUS | Status: AC

## 2023-07-29 MED ORDER — ACETAMINOPHEN 325 MG PO TABS: 650.0000 mg | ORAL_TABLET | Freq: Four times a day (QID) | ORAL | Status: DC | PRN

## 2023-07-29 MED ORDER — CHLORHEXIDINE GLUCONATE CLOTH 2 % EX PADS
6.0000 | MEDICATED_PAD | Freq: Every day | CUTANEOUS | Status: DC
  Administered 2023-07-29: 6 via TOPICAL
  Filled 2023-07-29: qty 6

## 2023-07-29 MED ORDER — SODIUM CHLORIDE 0.9 % IV SOLN
12.5000 mg | Freq: Four times a day (QID) | INTRAVENOUS | Status: DC | PRN
  Administered 2023-07-29 – 2023-07-30 (×3): 12.5 mg via INTRAVENOUS
  Filled 2023-07-29 (×2): qty 12.5
  Filled 2023-07-29: qty 0.5

## 2023-07-29 MED ORDER — ACETAMINOPHEN 650 MG RE SUPP
650.0000 mg | Freq: Four times a day (QID) | RECTAL | Status: DC | PRN
Start: 2023-07-29 — End: 2023-07-30

## 2023-07-29 MED ORDER — SODIUM CHLORIDE 0.9 % IV SOLN: Freq: Once | INTRAVENOUS | Status: AC

## 2023-07-29 NOTE — H&P (Signed)
 History and Physical  Logan Washington ZOX:[id number] DOB: 12/31/2000 DOA: 07/29/2023  PCP: Carlynn Chiles, PA-C   Chief Complaint: Abd pain, N/V   HPI: Logan Washington is a 23 y.o. male with medical history significant for type 1 diabetes, ADHD, THC abuse, and cannabis hyperemesis who presented to the med [facility] ED for evaluation of pain, nausea, vomiting and diarrhea.  Patient reports that he ate a burger that someone made for him yesterday but since last night, he has had worsening generalized abdominal pain described as cramping.  He woke up this morning with persistent nausea and vomiting and inability to keep anything down.  He also endorsed diarrhea, shortness of breath and chills but denies any chest pain, bloody stools, or dizziness.  States he did not take his Lantus  last night his blood sugar has been high all day.  ED Course: Initial vitals show patient afebrile RR 16-24, HR 70s to 90s, SBP 140s to 150s, SpO2 100% on room air. Initial labs significant for WBC 14.7, sodium 136, K+ 5.2, bicarb 20, blood glucose 424, creatinine 1.16, anion gap 23, bilirubin 2.1, normal lipase, VBG with normal pH, UA with significant glucosuria and ketonuria but no signs of infection, UDS positive for THC. EKG shows sinus rhythm with early repolarization and RVH.  CT A/P with no acute intra-abdominal or pelvic pathology. Pt received IV Pepcid 20 mg x 1, IV Zofran  4 mg x 1, IV LR 1 L bolus x 2 and started on Endo tool.  Patient was admitted to [facility] service and transferred to [facility].  Review of Systems: Please see HPI for pertinent positives and negatives. A complete 10 system review of systems are otherwise negative.  Past Medical History:  Diagnosis Date   ADHD (attention deficit hyperactivity disorder)    Diabetes (HCC)    Diabetes mellitus without complication (HCC)    Past Surgical History:  Procedure Laterality Date   APPENDECTOMY     ESOPHAGOGASTRODUODENOSCOPY (EGD) WITH PROPOFOL  N/A  03/18/2021   Procedure: ESOPHAGOGASTRODUODENOSCOPY (EGD) WITH PROPOFOL ;  Surgeon: Ozell Blunt, MD;  Location: [facility] ENDOSCOPY;  Service: Endoscopy;  Laterality: N/A;   LAPAROSCOPIC APPENDECTOMY N/A 04/08/2023   Procedure: APPENDECTOMY LAPAROSCOPIC;  Surgeon: Dareen Ebbing, MD;  Location: [facility] OR;  Service: General;  Laterality: N/A;   TONSILLECTOMY     Social History:  reports that he has been smoking cigarettes. He has never used smokeless tobacco. He reports current drug use. Drug: Marijuana. He reports that he does not drink alcohol.  Allergies  Allergen Reactions   Banana Anaphylaxis   Tilapia [Fish Allergy] Swelling    Family History  Problem Relation Age of Onset   Breast cancer Mother    Lung cancer Mother    Colon cancer Maternal Grandmother    Sudden death Neg Hx    Heart attack Neg Hx    Stomach cancer Neg Hx    Pancreatic cancer Neg Hx    Esophageal cancer Neg Hx    Rectal cancer Neg Hx      Prior to Admission medications   Medication Sig Start Date End Date Taking? Authorizing Provider  acetaminophen  (TYLENOL ) 500 MG tablet Take 2 tablets (1,000 mg total) by mouth every 6 (six) hours as needed for mild pain (pain score 1-3) or moderate pain (pain score 4-6) (or Fever >/= 101). 04/08/23   Elwin Hammond, PA-C  BAQSIMI ONE PACK 3 MG/DOSE POWD Place 1 spray into both nostrils as needed (Hypoglycemia). 06/10/23   [provider]  LANTUS  SOLOSTAR 100 UNIT/ML Solostar Pen Inject 15 Units into the skin at bedtime. 06/19/23   Logan Grip, MD  NOVOLOG  FLEXPEN 100 UNIT/ML FlexPen Inject 0.5-12 Units into the skin 3 (three) times daily with meals. Sliding scale , Every 7 carbs =1 unit per patient 10/09/15   [provider]  ondansetron  (ZOFRAN ) 4 MG tablet Take 1 tablet (4 mg total) by mouth every 6 (six) hours as needed for nausea. 06/13/23   Aura Leeds Latif, DO  promethazine  (PHENERGAN ) 25 MG tablet Take 1 tablet (25 mg total) by mouth every 6 (six) hours as  needed for nausea or vomiting. 06/18/23   Lowery Rue, DO    Physical Exam: BP (!) 147/74   Pulse 81   Temp 98.2 F (36.8 C)   Resp 20   Ht 5' 10 (1.778 m)   Wt 73.6 kg   SpO2 99%   BMI 23.29 kg/m  General: Acutely ill young man laying in bed. In mild distress due to abdominal pain HEENT: [place]/AT. Anicteric sclera. Dry mucous membrane CV: Tachycardic. Regular rhythm. No murmurs, rubs, or gallops. No LE edema Pulmonary: Lungs CTAB. Normal effort. No wheezing or rales. Abdominal: Soft, nondistended. Moderate generalized tenderness to palpation. Normal bowel sounds. Extremities: Palpable radial and DP pulses. Normal ROM. Skin: Warm and dry. No obvious rash or lesions. Decreased skin turgor Neuro: A&Ox3. Requires multiple prompting to answer questions. Moves all extremities. Normal sensation to light touch. No focal deficit. Psych: Flat affect          Labs on Admission:  Basic Metabolic Panel: Recent Labs  Lab 07/29/23 1218 07/29/23 1223 07/29/23 1429  NA 136 134* 136  K 5.2* 5.0 5.1  CL 93*  --  95*  CO2 20*  --  16*  GLUCOSE 424*  --  397*  BUN 17  --  18  CREATININE 1.16  --  0.99  CALCIUM 10.1  --  9.6   Liver Function Tests: Recent Labs  Lab 07/29/23 1218  AST 22  ALT 14  ALKPHOS 115  BILITOT 2.1*  PROT 7.9  ALBUMIN 4.9   Recent Labs  Lab 07/29/23 1218  LIPASE 16   No results for input(s): AMMONIA in the last 168 hours. CBC: Recent Labs  Lab 07/29/23 1218 07/29/23 1223  WBC 14.7*  --   NEUTROABS 13.5*  --   HGB 16.5 17.0  HCT 47.3 50.0  MCV 84.6  --   PLT 368  --    Cardiac Enzymes: No results for input(s): CKTOTAL, CKMB, CKMBINDEX, TROPONINI in the last 168 hours. BNP (last 3 results) No results for input(s): BNP in the last 8760 hours.  ProBNP (last 3 results) No results for input(s): PROBNP in the last 8760 hours.  CBG: Recent Labs  Lab 07/29/23 1203 07/29/23 1500 07/29/23 1614  GLUCAP 383* 384* 324*     Radiological Exams on Admission: CT ABDOMEN PELVIS W CONTRAST Result Date: 07/29/2023 CLINICAL DATA:  Abdominal pain, acute, nonlocalized EXAM: CT ABDOMEN AND PELVIS WITH CONTRAST TECHNIQUE: Multidetector CT imaging of the abdomen and pelvis was performed using the standard protocol following bolus administration of intravenous contrast. RADIATION DOSE REDUCTION: This exam was performed according to the departmental dose-optimization program which includes automated exposure control, adjustment of the mA and/or kV according to patient size and/or use of iterative reconstruction technique. CONTRAST:  OMNIPAQUE  IOHEXOL  300 MG/ML  SOLN COMPARISON:  06/16/2023. FINDINGS: Lower chest: No acute abnormality. Hepatobiliary: No focal liver abnormality is seen.  No gallstones, gallbladder wall thickening, or biliary dilatation. Pancreas: Unremarkable. No pancreatic ductal dilatation or surrounding inflammatory changes. Spleen: Normal in size without focal abnormality. Adrenals/Urinary Tract: Adrenal glands are unremarkable. Kidneys are normal, without renal calculi, focal lesion, or hydronephrosis. Bladder is unremarkable. Stomach/Bowel: Stomach is within normal limits. Status post appendectomy. Small bowel and colon are grossly unremarkable. No evidence of obstruction or focal inflammatory changes. Vascular/Lymphatic: No significant vascular findings are present. No enlarged abdominal or pelvic lymph nodes. Reproductive: Prostate is unremarkable. Other: No abdominal wall hernia or abnormality. No abdominopelvic ascites. No free air. Musculoskeletal: No acute or significant osseous findings. IMPRESSION: No acute localizing findings in the abdomen or pelvis. Electronically Signed   By: Mannie Seek M.D.   On: 07/29/2023 14:50   Assessment/Plan Logan Washington is a 23 y.o. male with medical history significant for type 1 diabetes, ADHD, THC abuse, and cannabis hyperemesis who presented to the med [facility] ED for evaluation of pain, nausea, vomiting and diarrhea and admitted for DKA.  # Diabetic ketoacidosis # Type 1 diabetes - Presented with acute onset of abdominal pain, nausea, vomiting and diarrhea - Found to have elevated blood sugar and labs consistent with DKA - Hx of uncontrolled diabetes with last A1c 8.7% 6 weeks ago - S/p LR bolus and initiation of insulin  drip in ED - Pt hemodynamically stable but in mild distress with persistent abdominal pain, N/V - Continue insulin  drip - LR @ 125 mL/hr until CBG less than 250 - Switch to D5-LR when 1 CBG less than 250 - Keep NPO  - BMP Q4H, CBG Q1H, BHB Q8H - Once anion gap closed 2, start CM diet and if able to eat, administer Semglee  15 units - Continue insulin  drip for 1-2 more hours, then discontinue and start SSI-S  - DC fluids if eating, drinking, and off insulin  drip  # Abdominal pain # Diarrhea # Nausea and vomiting - Reports eating a burger made by someone yesterday followed by abdominal pain overnight - Woke up with persistent nausea, vomiting and diarrhea - No risk factors for C. Difficile - CT A/P negative for acute intra-abdominal pathology - Check GI panel to rule out acute gastroenteritis - Pain control with as needed IV Dilaudid  - Antiemetic with IV Phenergan  - Trend and replete electrolytes - Enteric precautions  # THC abuse - Has a history of cannabis hyperemesis - UDS positive for THC - Likely playing a role in his current GI symptoms   DVT prophylaxis: Lovenox      Code Status: Full Code  Consults called: None  Family Communication: No family at bedside  Severity of Illness: The appropriate patient status for this patient is OBSERVATION. Observation status is judged to be reasonable and necessary in order to provide the required intensity of service to ensure the patient's safety. The patient's presenting symptoms, physical exam findings, and initial radiographic and laboratory data in the context  of their medical condition is felt to place them at decreased risk for further clinical deterioration. Furthermore, it is anticipated that the patient will be medically stable for discharge from the hospital within 2 midnights of admission.   Level of care: Stepdown   This record has been created using Conservation officer, historic buildings. Errors have been sought and corrected, but may not always be located. Such creation errors do not reflect on the standard of care.   Logan Grip, MD 07/29/2023, 5:15 PM [facility] Pager: [phone] Isaiah 41:10   If 7PM-7AM, please  contact night-coverage [email or web address] Password [email or web address]

## 2023-07-29 NOTE — ED Notes (Signed)
 Patient transported to CT

## 2023-07-29 NOTE — ED Triage Notes (Signed)
 Pt states that he is having n/v/d since he woke up this am.  Pt is vomiting during triage.

## 2023-07-29 NOTE — ED Provider Notes (Signed)
 [place] EMERGENCY DEPARTMENT AT [facility] Provider Note   CSN: [id number] Arrival date & time: 07/29/23  1141     Patient presents with: Abdominal Pain   Logan Washington is a 23 y.o. male.   23 year old male with past medical history of diabetes and cannabis hyperemesis in the past presenting to the emergency department today with concern for nausea, vomiting, and abdominal cramping.  The patient states that he has not been using marijuana recently.  He states that he started with nausea and vomiting this morning.  He has been nonbloody and nonbilious.  He was unable to stop so came to the ER for further evaluation.  His blood sugars have been running high since he woke up this morning.        Prior to Admission medications   Medication Sig Start Date End Date Taking? Authorizing Provider  acetaminophen  (TYLENOL ) 500 MG tablet Take 2 tablets (1,000 mg total) by mouth every 6 (six) hours as needed for mild pain (pain score 1-3) or moderate pain (pain score 4-6) (or Fever >/= 101). 04/08/23   Elwin Hammond, PA-C  BAQSIMI ONE PACK 3 MG/DOSE POWD Place 1 spray into both nostrils as needed (Hypoglycemia). 06/10/23   [provider]  LANTUS  SOLOSTAR 100 UNIT/ML Solostar Pen Inject 15 Units into the skin at bedtime. 06/19/23   Vita Grip, MD  NOVOLOG  FLEXPEN 100 UNIT/ML FlexPen Inject 0.5-12 Units into the skin 3 (three) times daily with meals. Sliding scale , Every 7 carbs =1 unit per patient 10/09/15   [provider]  ondansetron  (ZOFRAN ) 4 MG tablet Take 1 tablet (4 mg total) by mouth every 6 (six) hours as needed for nausea. 06/13/23   Aura Leeds Latif, DO  promethazine  (PHENERGAN ) 25 MG tablet Take 1 tablet (25 mg total) by mouth every 6 (six) hours as needed for nausea or vomiting. 06/18/23   Curatolo, Adam, DO    Allergies: Banana and Tilapia [fish allergy]    Review of Systems  Gastrointestinal:  Positive for abdominal pain, nausea and  vomiting.  All other systems reviewed and are negative.   Updated Vital Signs BP (!) 158/70   Pulse 75   Temp 98.2 F (36.8 C)   Resp 15   Ht 5' 10 (1.778 m)   Wt 73.6 kg   SpO2 100%   BMI 23.29 kg/m   Physical Exam Vitals and nursing note reviewed.   Gen: Appears uncomfortable, vomiting intermittently during interview Eyes: PERRL, EOMI HEENT: no oropharyngeal swelling, dry mucous membranes Neck: trachea midline Resp: clear to auscultation bilaterally Card: RRR, no murmurs, rubs, or gallops Abd: Mild diffuse tenderness with no guarding or rebound Extremities: no calf tenderness, no edema Vascular: 2+ radial pulses bilaterally, 2+ DP pulses bilaterally Skin: no rashes Psyc: acting appropriately   (all labs ordered are listed, but only abnormal results are displayed) Labs Reviewed  CBC WITH DIFFERENTIAL/PLATELET - Abnormal; Notable for the following components:      Result Value   WBC 14.7 (*)    Neutro Abs 13.5 (*)    All other components within normal limits  COMPREHENSIVE METABOLIC PANEL WITH GFR - Abnormal; Notable for the following components:   Potassium 5.2 (*)    Chloride 93 (*)    CO2 20 (*)    Glucose, Bld 424 (*)    Total Bilirubin 2.1 (*)    Anion gap 23 (*)    All other components within normal limits  URINALYSIS, ROUTINE W REFLEX  MICROSCOPIC - Abnormal; Notable for the following components:   Glucose, UA >=500 (*)    Ketones, ur >=80 (*)    All other components within normal limits  URINE DRUG SCREEN - Abnormal; Notable for the following components:   Tetrahydrocannabinol DETECTED (*)    All other components within normal limits  URINALYSIS, MICROSCOPIC (REFLEX) - Abnormal; Notable for the following components:   Bacteria, UA RARE (*)    All other components within normal limits  CBG MONITORING, ED - Abnormal; Notable for the following components:   Glucose-Capillary 383 (*)    All other components within normal limits  I-STAT VENOUS BLOOD GAS,  ED - Abnormal; Notable for the following components:   pCO2, Ven 37.4 (*)    Acid-base deficit 3.0 (*)    Sodium 134 (*)    Calcium, Ion 1.09 (*)    All other components within normal limits  LIPASE, BLOOD  BASIC METABOLIC PANEL WITH GFR    EKG: EKG Interpretation Date/Time:  Tuesday July 29 2023 12:31:44 EDT Ventricular Rate:  67 PR Interval:  121 QRS Duration:  115 QT Interval:  425 QTC Calculation: 449 R Axis:   89  Text Interpretation: Sinus arrhythmia Nonspecific intraventricular conduction delay ST elev, probable normal early repol pattern Confirmed by Abner Hoffman [phone]) on 07/29/2023 12:38:56 PM  Radiology: No results found.   Procedures   Medications Ordered in the ED  promethazine  (PHENERGAN ) 12.5 mg in sodium chloride  0.9 % 50 mL IVPB (12.5 mg Intravenous New Bag/Given 07/29/23 1428)  0.9 %  sodium chloride  infusion ( Intravenous New Bag/Given 07/29/23 1425)  insulin  regular, human (MYXREDLIN ) 100 units/ 100 mL infusion (has no administration in time range)  dextrose  50 % solution 0-50 mL (has no administration in time range)  lactated ringers  bolus 1,000 mL (0 mLs Intravenous Stopped 07/29/23 1351)  ondansetron  (ZOFRAN ) injection 4 mg (4 mg Intravenous Given 07/29/23 1229)  lactated ringers  bolus 1,000 mL (0 mLs Intravenous Stopped 07/29/23 1410)  famotidine (PEPCID) IVPB 20 mg premix (0 mg Intravenous Stopped 07/29/23 1330)  iohexol  (OMNIPAQUE ) 300 MG/ML solution 100 mL (100 mLs Intravenous Contrast Given 07/29/23 1317)  promethazine  (PHENERGAN ) 25 MG/ML injection (  Return to [facility] 07/29/23 1431)                                    Medical Decision Making 23 year old male with past medical history of diabetes and cyclic vomiting/cannabis hyperemesis syndrome presenting to the emergency department today with nausea and vomiting.  I will further evaluate the patient here with basic labs including LFTs and a lipase.  His initial blood glucose is 383.  I will obtain a  venous blood gas in addition to the above workup and urinalysis to evaluate for diabetic ketoacidosis.  Will give the patient IV fluids and Zofran  for his symptoms.  I will reevaluate for ultimate disposition.  The patient's labs are consistent with DKA.  pH is within normal limits as he is compensating but he does have a gap of 23.  After discussion with hospitalist service Endo tool is recommended.  This is ordered to start.  He will be admitted for further evaluation due to ongoing nausea and vomiting.  CRITICAL CARE Performed by: Carin Charleston   Total critical care time: 30 minutes  Critical care time was exclusive of separately billable procedures and treating other patients.  Critical care was necessary to treat or  prevent imminent or life-threatening deterioration.  Critical care was time spent personally by me on the following activities: development of treatment plan with patient and/or surrogate as well as nursing, discussions with consultants, evaluation of patient's response to treatment, examination of patient, obtaining history from patient or surrogate, ordering and performing treatments and interventions, ordering and review of laboratory studies, ordering and review of radiographic studies, pulse oximetry and re-evaluation of patient's condition.   Amount and/or Complexity of Data Reviewed Labs: ordered. Radiology: ordered.  Risk Prescription drug management. Decision regarding hospitalization.        Final diagnoses:  Diabetic ketoacidosis without coma associated with other specified diabetes mellitus (HCC)  Intractable vomiting    ED Discharge Orders     None          Carin Charleston, MD 07/29/23 1447

## 2023-07-29 NOTE — Plan of Care (Signed)

## 2023-07-30 DIAGNOSIS — E1010 Type 1 diabetes mellitus with ketoacidosis without coma: Secondary | ICD-10-CM | POA: Diagnosis not present

## 2023-07-30 LAB — CBC
HCT: 42.3 % (ref 39.0–52.0)
Hemoglobin: 14.3 g/dL (ref 13.0–17.0)
MCH: 29.7 pg (ref 26.0–34.0)
MCHC: 33.8 g/dL (ref 30.0–36.0)
MCV: 87.8 fL (ref 80.0–100.0)
Platelets: 331 10*3/uL (ref 150–400)
RBC: 4.82 MIL/uL (ref 4.22–5.81)
RDW: 12.5 % (ref 11.5–15.5)
WBC: 18 10*3/uL — ABNORMAL HIGH (ref 4.0–10.5)
nRBC: 0 % (ref 0.0–0.2)

## 2023-07-30 LAB — BETA-HYDROXYBUTYRIC ACID
Beta-Hydroxybutyric Acid: 0.4 mmol/L — ABNORMAL HIGH (ref 0.05–0.27)
Beta-Hydroxybutyric Acid: 3.63 mmol/L — ABNORMAL HIGH (ref 0.05–0.27)

## 2023-07-30 LAB — BASIC METABOLIC PANEL WITH GFR
Anion gap: 10 (ref 5–15)
Anion gap: 10 (ref 5–15)
Anion gap: 12 (ref 5–15)
BUN: 17 mg/dL (ref 6–20)
BUN: 16 mg/dL (ref 6–20)
BUN: 17 mg/dL (ref 6–20)
CO2: 24 mmol/L (ref 22–32)
CO2: 22 mmol/L (ref 22–32)
CO2: 21 mmol/L — ABNORMAL LOW (ref 22–32)
Calcium: 9.4 mg/dL (ref 8.9–10.3)
Calcium: 9 mg/dL (ref 8.9–10.3)
Calcium: 9.1 mg/dL (ref 8.9–10.3)
Chloride: 101 mmol/L (ref 98–111)
Chloride: 102 mmol/L (ref 98–111)
Chloride: 101 mmol/L (ref 98–111)
Creatinine, Ser: 0.98 mg/dL (ref 0.61–1.24)
Creatinine, Ser: 0.93 mg/dL (ref 0.61–1.24)
Creatinine, Ser: 0.93 mg/dL (ref 0.61–1.24)
GFR, Estimated: >60 mL/min (ref >60)
GFR, Estimated: >60 mL/min (ref >60)
GFR, Estimated: >60 mL/min (ref >60)
Glucose, Bld: 155 mg/dL — ABNORMAL HIGH (ref 70–99)
Glucose, Bld: 124 mg/dL — ABNORMAL HIGH (ref 70–99)
Glucose, Bld: 262 mg/dL — ABNORMAL HIGH (ref 70–99)
Potassium: 3.9 mmol/L (ref 3.5–5.1)
Potassium: 3.7 mmol/L (ref 3.5–5.1)
Potassium: 3.8 mmol/L (ref 3.5–5.1)
Sodium: 135 mmol/L (ref 135–145)
Sodium: 134 mmol/L — ABNORMAL LOW (ref 135–145)
Sodium: 134 mmol/L — ABNORMAL LOW (ref 135–145)

## 2023-07-30 LAB — GLUCOSE, CAPILLARY
Glucose-Capillary: 125 mg/dL — ABNORMAL HIGH (ref 70–99)
Glucose-Capillary: 137 mg/dL — ABNORMAL HIGH (ref 70–99)
Glucose-Capillary: 139 mg/dL — ABNORMAL HIGH (ref 70–99)
Glucose-Capillary: 155 mg/dL — ABNORMAL HIGH (ref 70–99)
Glucose-Capillary: 163 mg/dL — ABNORMAL HIGH (ref 70–99)
Glucose-Capillary: 254 mg/dL — ABNORMAL HIGH (ref 70–99)

## 2023-07-30 LAB — MAGNESIUM: Magnesium: 1.8 mg/dL (ref 1.7–2.4)

## 2023-07-30 LAB — PHOSPHORUS: Phosphorus: 3.1 mg/dL (ref 2.5–4.6)

## 2023-07-30 MED ORDER — ONDANSETRON HCL 4 MG/2ML IJ SOLN
4.0000 mg | Freq: Once | INTRAMUSCULAR | Status: AC | PRN
  Administered 2023-07-30: 4 mg via INTRAVENOUS
  Filled 2023-07-30: qty 2

## 2023-07-30 MED ORDER — INSULIN GLARGINE-YFGN 100 UNIT/ML ~~LOC~~ SOLN
10.0000 [IU] | Freq: Once | SUBCUTANEOUS | Status: AC
  Administered 2023-07-30: 10 [IU] via SUBCUTANEOUS
  Filled 2023-07-30: qty 0.1

## 2023-07-30 MED ORDER — INSULIN GLARGINE-YFGN 100 UNIT/ML ~~LOC~~ SOLN
5.0000 [IU] | Freq: Once | SUBCUTANEOUS | Status: AC
  Administered 2023-07-30: 5 [IU] via SUBCUTANEOUS
  Filled 2023-07-30: qty 0.05

## 2023-07-30 MED ORDER — INSULIN ASPART 100 UNIT/ML IJ SOLN
0.0000 [IU] | Freq: Three times a day (TID) | INTRAMUSCULAR | Status: DC
  Administered 2023-07-30: 5 [IU] via SUBCUTANEOUS

## 2023-07-30 MED ORDER — INSULIN ASPART 100 UNIT/ML IJ SOLN: 0.0000 [IU] | Freq: Every day | INTRAMUSCULAR | Status: DC

## 2023-07-30 MED ORDER — PHENOL 1.4 % MT LIQD
1.0000 | OROMUCOSAL | Status: DC | PRN
  Administered 2023-07-30: 1 via OROMUCOSAL
  Filled 2023-07-30: qty 177

## 2023-07-30 NOTE — Discharge Summary (Signed)
 Physician Discharge Summary  Logan Washington WUJ:[id number] DOB: May 08, 2000 DOA: 07/29/2023  PCP: Carlynn Chiles, PA-C  Admit date: 07/29/2023 Discharge date: 07/30/2023    Admitted From: Home  Disposition: Home  Recommendations for Outpatient Follow-up:  Follow up with PCP in 1-2 weeks Please obtain BMP/CBC in one week Please follow up with your PCP on the following pending results: Unresulted Labs (From admission, onward)     Start     Ordered   07/30/23 0500  Basic metabolic panel  Daily,   R     Question:  Specimen collection method  Answer:  Lab=Lab collect   07/30/23 0134   07/30/23 0000  Basic metabolic panel  Now then every 4 hours,   R (with TIMED occurrences)     Question:  Specimen collection method  Answer:  Lab=Lab collect   07/29/23 2108   07/30/23 0000  Beta-hydroxybutyric acid  Now then every 8 hours,   R (with TIMED occurrences)     Question:  Specimen collection method  Answer:  Lab=Lab collect   07/29/23 2108   07/29/23 1743  Gastrointestinal Panel by PCR , Stool  (Gastrointestinal Panel by PCR, Stool                                                                                                                                                     **Does Not include CLOSTRIDIUM DIFFICILE testing. **If CDIFF testing is needed, place order from the C Difficile Testing order set.**)  Once,   R        07/29/23 1742              Home Health: None Equipment/Devices: None none  Discharge Condition: Stable CODE STATUS: Full code Diet recommendation: Consistent carbohydrate/diabetic diet  HPI: Logan Washington is a 23 y.o. male with medical history significant for type 1 diabetes, ADHD, THC abuse, and cannabis hyperemesis who presented to the med [facility] ED for evaluation of pain, nausea, vomiting and diarrhea.  Patient reports that he ate a burger that someone made for him yesterday but since last night, he has had worsening generalized abdominal pain  described as cramping.  He woke up this morning with persistent nausea and vomiting and inability to keep anything down.  He also endorsed diarrhea, shortness of breath and chills but denies any chest pain, bloody stools, or dizziness.  States he did not take his Lantus  last night his blood sugar has been high all day.   ED Course: Initial vitals show patient afebrile RR 16-24, HR 70s to 90s, SBP 140s to 150s, SpO2 100% on room air. Initial labs significant for WBC 14.7, sodium 136, K+ 5.2, bicarb 20, blood glucose 424, creatinine 1.16, anion gap 23, bilirubin 2.1, normal lipase, VBG with normal pH, UA with significant glucosuria and ketonuria but no signs of infection,  UDS positive for THC. EKG shows sinus rhythm with early repolarization and RVH.  CT A/P with no acute intra-abdominal or pelvic pathology. Pt received IV Pepcid 20 mg x 1, IV Zofran  4 mg x 1, IV LR 1 L bolus x 2 and started on Endo tool.  Patient was admitted to [facility] service and transferred to [facility].  Subjective: Seen and examined, feels much better.  Has some nausea but he says that he always has nausea when he wakes up in the morning.  No vomiting.  Wants to eat.  No other complaint.  Ready to go home.  Brief/Interim Summary: Patient was briefly hospitalized due to DKA and presumed viral gastroenteritis due to complaints of nausea vomiting and diarrhea after eating a burger which he believes was not good.  However after admission, patient has not had any vomiting or any diarrhea.  Patient was treated with insulin  drip, IV fluids per protocol for DKA.  DKA resolved, anion gap closed.  He has been transitioned back to long-acting insulin  and SSI.  Doing well, he is going to try breakfast.  He feels confident going home.  He is being discharged in a stable condition.  Electrolytes are stable.  GI pathogen panel ordered but patient has not had any bowel movement for this to be tested.  Doubt any infectious etiology of diarrhea.  Diarrhea has  resolved as well.  Discharge plan was discussed with patient and/or family member and they verbalized understanding and agreed with it.  Discharge Diagnoses:  Principal Problem:   DKA (diabetic ketoacidosis) (HCC) Active Problems:   Diarrhea    Discharge Instructions   Allergies as of 07/30/2023       Reactions   Banana Anaphylaxis   Tilapia [fish Allergy] Swelling        Medication List     TAKE these medications    acetaminophen  500 MG tablet Commonly known as: TYLENOL  Take 2 tablets (1,000 mg total) by mouth every 6 (six) hours as needed for mild pain (pain score 1-3) or moderate pain (pain score 4-6) (or Fever >/= 101).   Baqsimi One Pack 3 MG/DOSE Powd Generic drug: Glucagon Place 1 spray into both nostrils as needed (Hypoglycemia).   Lantus  SoloStar 100 UNIT/ML Solostar Pen Generic drug: insulin  glargine Inject 15 Units into the skin at bedtime.   NovoLOG  FlexPen 100 UNIT/ML FlexPen Generic drug: insulin  aspart Inject 0.5-12 Units into the skin 3 (three) times daily with meals. Sliding scale , Every 7 carbs =1 unit per patient        Follow-up Information     Carlynn Chiles, PA-C Follow up in 1 week(s).   Specialty: Family Medicine Contact information: [address] [place] [address] [phone]                Allergies  Allergen Reactions   Banana Anaphylaxis   Tilapia [Fish Allergy] Swelling    Consultations: None   Procedures/Studies: CT ABDOMEN PELVIS W CONTRAST Result Date: 07/29/2023 CLINICAL DATA:  Abdominal pain, acute, nonlocalized EXAM: CT ABDOMEN AND PELVIS WITH CONTRAST TECHNIQUE: Multidetector CT imaging of the abdomen and pelvis was performed using the standard protocol following bolus administration of intravenous contrast. RADIATION DOSE REDUCTION: This exam was performed according to the departmental dose-optimization program which includes automated exposure control, adjustment of the mA and/or kV  according to patient size and/or use of iterative reconstruction technique. CONTRAST:  OMNIPAQUE  IOHEXOL  300 MG/ML  SOLN COMPARISON:  06/16/2023. FINDINGS: Lower chest:  No acute abnormality. Hepatobiliary: No focal liver abnormality is seen. No gallstones, gallbladder wall thickening, or biliary dilatation. Pancreas: Unremarkable. No pancreatic ductal dilatation or surrounding inflammatory changes. Spleen: Normal in size without focal abnormality. Adrenals/Urinary Tract: Adrenal glands are unremarkable. Kidneys are normal, without renal calculi, focal lesion, or hydronephrosis. Bladder is unremarkable. Stomach/Bowel: Stomach is within normal limits. Status post appendectomy. Small bowel and colon are grossly unremarkable. No evidence of obstruction or focal inflammatory changes. Vascular/Lymphatic: No significant vascular findings are present. No enlarged abdominal or pelvic lymph nodes. Reproductive: Prostate is unremarkable. Other: No abdominal wall hernia or abnormality. No abdominopelvic ascites. No free air. Musculoskeletal: No acute or significant osseous findings. IMPRESSION: No acute localizing findings in the abdomen or pelvis. Electronically Signed   By: Mannie Seek M.D.   On: 07/29/2023 14:50     Discharge Exam: Vitals:   07/30/23 0800 07/30/23 0814  BP: (!) 150/73 (!) 151/86  Pulse: 73 90  Resp: 16 12  Temp:    SpO2: 99% 100%   Vitals:   07/30/23 0600 07/30/23 0706 07/30/23 0800 07/30/23 0814  BP: 135/74  (!) 150/73 (!) 151/86  Pulse: 83 87 73 90  Resp: 19 16 16 12   Temp:      TempSrc:      SpO2: 100% 99% 99% 100%  Weight:      Height:        General: Pt is alert, awake, not in acute distress Cardiovascular: RRR, S1/S2 +, no rubs, no gallops Respiratory: CTA bilaterally, no wheezing, no rhonchi Abdominal: Soft, NT, ND, bowel sounds + Extremities: no edema, no cyanosis    The results of significant diagnostics from this hospitalization (including imaging,  microbiology, ancillary and laboratory) are listed below for reference.     Microbiology: Recent Results (from the past 240 hours)  MRSA Next Gen by PCR, Nasal     Status: None   Collection Time: 07/29/23  5:00 PM   Specimen: Nasal Mucosa; Nasal Swab  Result Value Ref Range Status   MRSA by PCR Next Gen NOT DETECTED NOT DETECTED Final    Comment: (NOTE) The GeneXpert MRSA Assay (FDA approved for NASAL specimens only), is one component of a comprehensive MRSA colonization surveillance program. It is not intended to diagnose MRSA infection nor to guide or monitor treatment for MRSA infections. Test performance is not FDA approved in patients less than 47 years old. Performed at [facility], [address], [place], [place] [address]      Labs: BNP (last 3 results) No results for input(s): BNP in the last 8760 hours. Basic Metabolic Panel: Recent Labs  Lab 07/29/23 1218 07/29/23 1223 07/29/23 1429 07/29/23 1940 07/30/23 0014 07/30/23 0314 07/30/23 0315  NA 136 134* 136 136 135 134*  --   K 5.2* 5.0 5.1 3.7 3.9 3.7  --   CL 93*  --  95* 103 101 102  --   CO2 20*  --  16* 19* 24 22  --   GLUCOSE 424*  --  397* 188* 155* 124*  --   BUN 17  --  18 18 17 16   --   CREATININE 1.16  --  0.99 0.98 0.98 0.93  --   CALCIUM 10.1  --  9.6 9.7 9.4 9.0  --   MG  --   --   --   --   --   --  1.8  PHOS  --   --   --   --   --   --  3.1   Liver Function Tests: Recent Labs  Lab 07/29/23 1218  AST 22  ALT 14  ALKPHOS 115  BILITOT 2.1*  PROT 7.9  ALBUMIN 4.9   Recent Labs  Lab 07/29/23 1218  LIPASE 16   No results for input(s): AMMONIA in the last 168 hours. CBC: Recent Labs  Lab 07/29/23 1218 07/29/23 1223 07/30/23 0315  WBC 14.7*  --  18.0*  NEUTROABS 13.5*  --   --   HGB 16.5 17.0 14.3  HCT 47.3 50.0 42.3  MCV 84.6  --  87.8  PLT 368  --  331   Cardiac Enzymes: No results for input(s): CKTOTAL, CKMB, CKMBINDEX, TROPONINI in the  last 168 hours. BNP: Invalid input(s): POCBNP CBG: Recent Labs  Lab 07/30/23 0131 07/30/23 0240 07/30/23 0356 07/30/23 0416 07/30/23 0813  GLUCAP 155* 139* 125* 137* 254*   D-Dimer No results for input(s): DDIMER in the last 72 hours. Hgb A1c No results for input(s): HGBA1C in the last 72 hours. Lipid Profile No results for input(s): CHOL, HDL, LDLCALC, TRIG, CHOLHDL, LDLDIRECT in the last 72 hours. Thyroid function studies No results for input(s): TSH, T4TOTAL, T3FREE, THYROIDAB in the last 72 hours.  Invalid input(s): FREET3 Anemia work up No results for input(s): VITAMINB12, FOLATE, FERRITIN, TIBC, IRON, RETICCTPCT in the last 72 hours. Urinalysis    Component Value Date/Time   COLORURINE YELLOW 07/29/2023 1310   APPEARANCEUR CLEAR 07/29/2023 1310   LABSPEC 1.015 07/29/2023 1310   PHURINE 5.5 07/29/2023 1310   GLUCOSEU >=500 (A) 07/29/2023 1310   HGBUR NEGATIVE 07/29/2023 1310   BILIRUBINUR NEGATIVE 07/29/2023 1310   KETONESUR >=80 (A) 07/29/2023 1310   PROTEINUR NEGATIVE 07/29/2023 1310   NITRITE NEGATIVE 07/29/2023 1310   LEUKOCYTESUR NEGATIVE 07/29/2023 1310   Sepsis Labs Recent Labs  Lab 07/29/23 1218 07/30/23 0315  WBC 14.7* 18.0*   Microbiology Recent Results (from the past 240 hours)  MRSA Next Gen by PCR, Nasal     Status: None   Collection Time: 07/29/23  5:00 PM   Specimen: Nasal Mucosa; Nasal Swab  Result Value Ref Range Status   MRSA by PCR Next Gen NOT DETECTED NOT DETECTED Final    Comment: (NOTE) The GeneXpert MRSA Assay (FDA approved for NASAL specimens only), is one component of a comprehensive MRSA colonization surveillance program. It is not intended to diagnose MRSA infection nor to guide or monitor treatment for MRSA infections. Test performance is not FDA approved in patients less than 51 years old. Performed at [facility], [address], [place], [place] [address]      FURTHER DISCHARGE INSTRUCTIONS:   Get Medicines reviewed and adjusted: Please take all your medications with you for your next visit with your Primary MD   Laboratory/radiological data: Please request your Primary MD to go over all hospital tests and procedure/radiological results at the follow up, please ask your Primary MD to get all Hospital records sent to his/her office.   In some cases, they will be blood work, cultures and biopsy results pending at the time of your discharge. Please request that your primary care M.D. goes through all the records of your hospital data and follows up on these results.   Also Note the following: If you experience worsening of your admission symptoms, develop shortness of breath, life threatening emergency, suicidal or homicidal thoughts you must seek medical attention immediately by calling 911 or calling your MD immediately  if symptoms less severe.   You must  read complete instructions/literature along with all the possible adverse reactions/side effects for all the Medicines you take and that have been prescribed to you. Take any new Medicines after you have completely understood and accpet all the possible adverse reactions/side effects.    patient was instructed, not to drive, operate heavy machinery, perform activities at heights, swimming or participation in water activities or provide baby-sitting services while on Pain, Sleep and Anxiety Medications; until their outpatient Physician has advised to do so again. Also recommended to not to take more than prescribed Pain, Sleep and Anxiety Medications.  It is not advisable to combine anxiety, sleep and pain medications without talking with your primary care provider.     Wear Seat belts while driving.   Please note: You were cared for by a hospitalist during your hospital stay. Once you are discharged, your primary care physician will handle any further medical issues. Please note that NO REFILLS  for any discharge medications will be authorized once you are discharged, as it is imperative that you return to your primary care physician (or establish a relationship with a primary care physician if you do not have one) for your post hospital discharge needs so that they can reassess your need for medications and monitor your lab values  Time coordinating discharge: Over 30 minutes  SIGNED:   Modena Andes, MD  [facility] 07/30/2023, 8:49 AM *Please note that this is a verbal dictation therefore any spelling or grammatical errors are due to the Dragon Medical One system interpretation. If 7PM-7AM, please contact night-coverage [email or web address]

## 2023-07-30 NOTE — Progress Notes (Signed)
 Discharge education provided, no questions/concerns from patient, he reports he has the supplies/medications he needs at home. PIVs removed, belongings returned. Patient assisted to main entrance safely.

## 2023-07-30 NOTE — Progress Notes (Signed)
   07/30/23 0920  TOC Brief Assessment  Insurance and Status Reviewed (BCBS COMM)  Patient has primary care physician Yes Ladoris Piedra, [place], [place])  Home environment has been reviewed From home with family  Prior level of function: Independent  Prior/Current Home Services No current home services  Social Drivers of Health Review SDOH reviewed no interventions necessary  Readmission risk has been reviewed Yes (N/A)  Transition of care needs no transition of care needs at this time

## 2023-08-31 IMAGING — CR DG CHEST 2V
2 series · 2 of 2 positions shown · non-contrast
Comparison: December 2018

CLINICAL DATA: DKA

EXAM:
CHEST - 2 VIEW

[w chest lat]
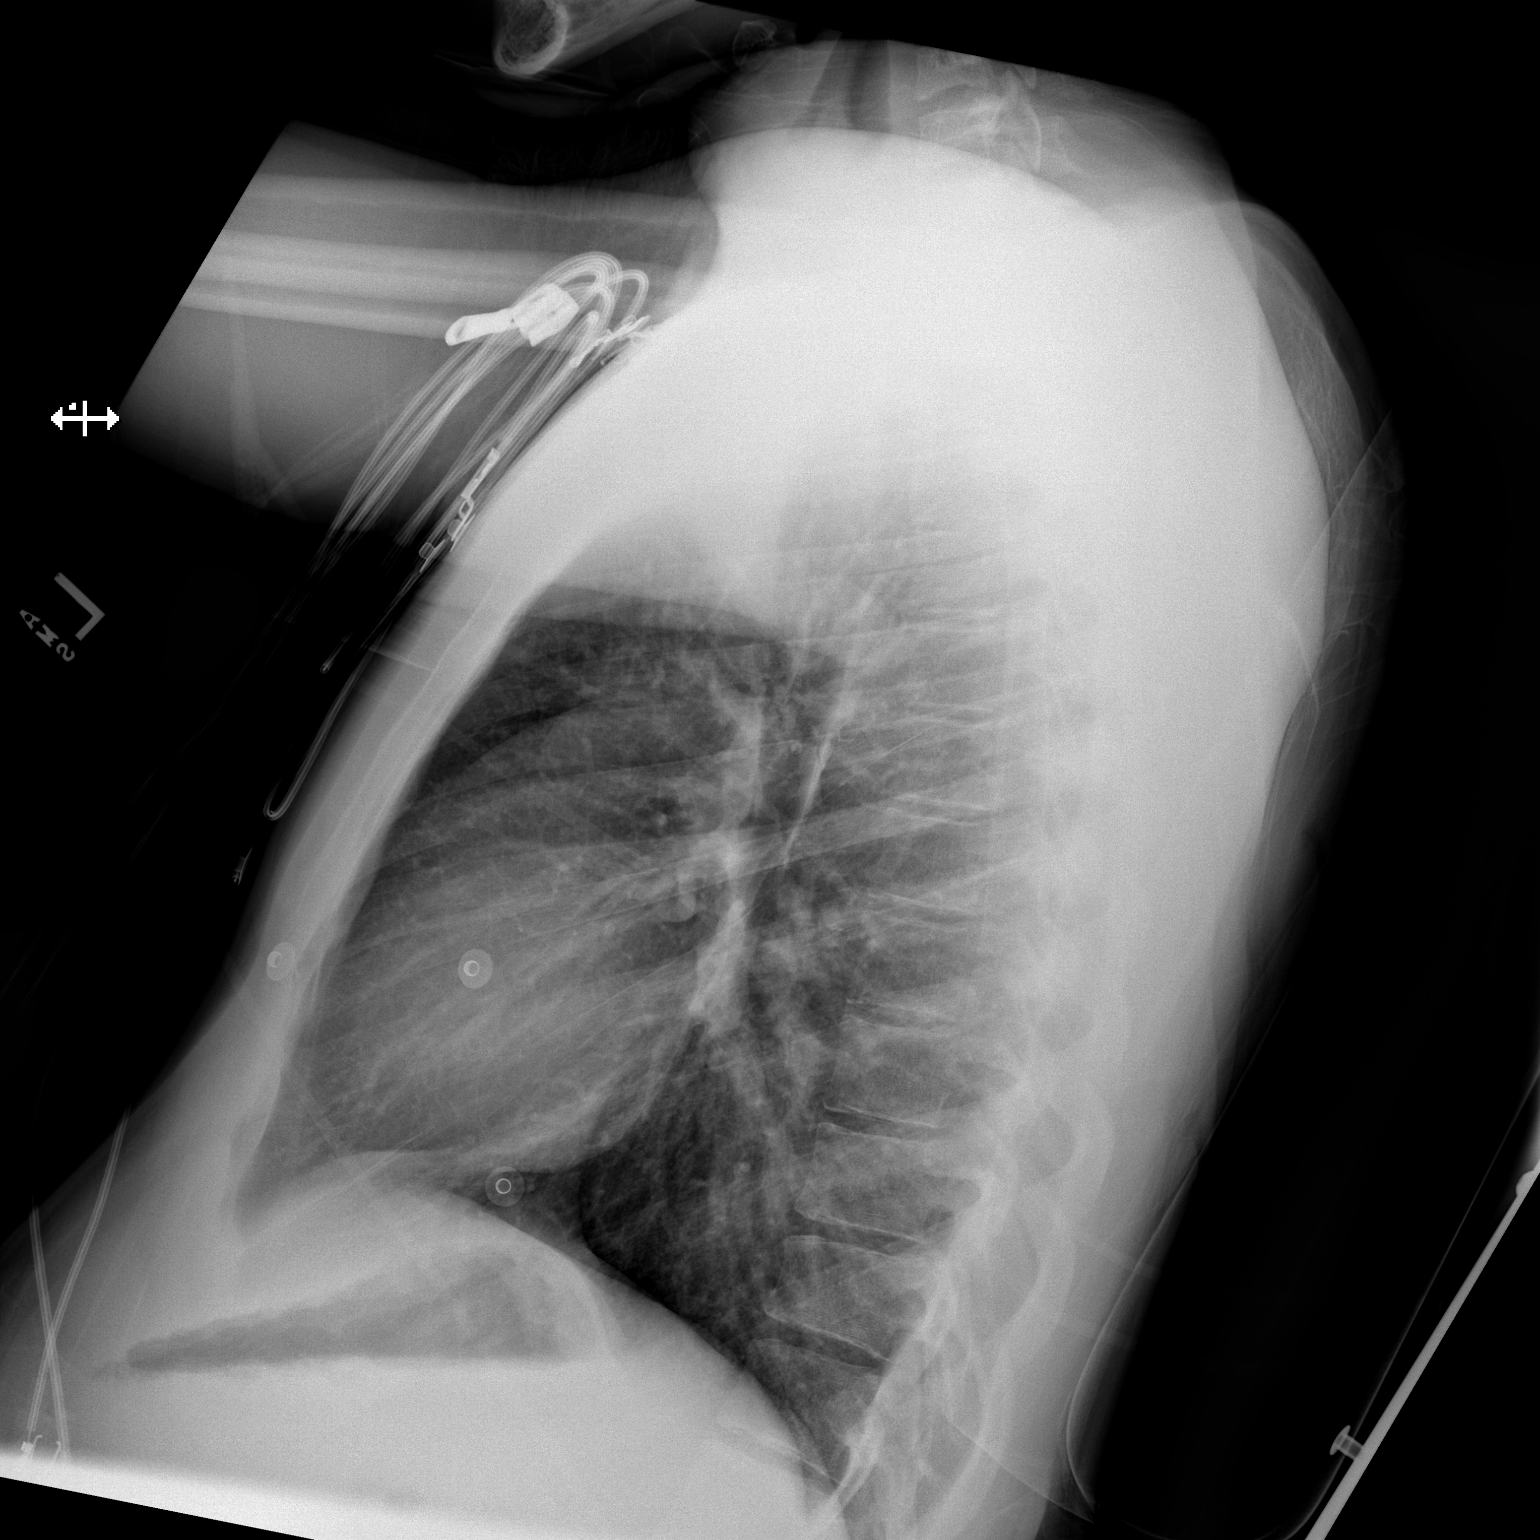

[x chest ap]
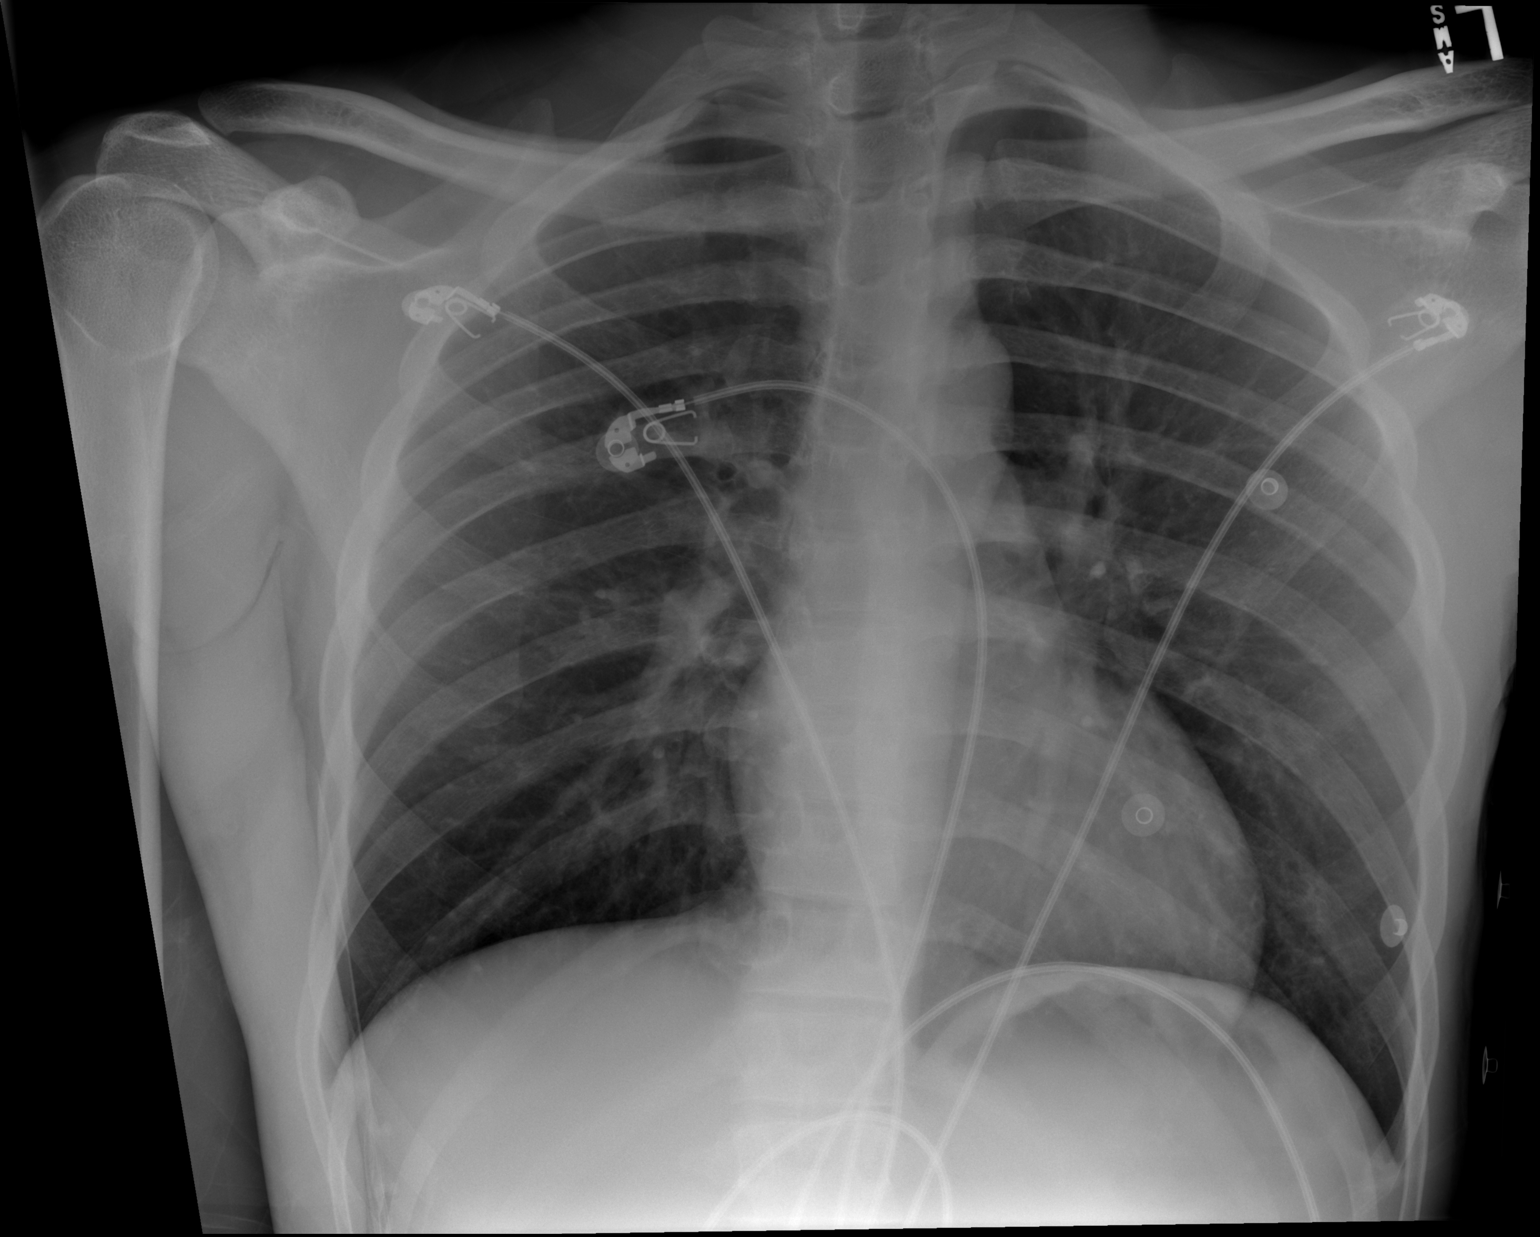

[2 of 2 positions shown; findings below may reference images not displayed]

FINDINGS: The heart size and mediastinal contours are within normal limits.
Both lungs are clear. No pleural effusion or pneumothorax. The
visualized skeletal structures are unremarkable.
IMPRESSION: No acute process in the chest.

## 2023-11-07 ENCOUNTER — Telehealth: Admitting: Family Medicine

## 2023-11-07 DIAGNOSIS — K2100 Gastro-esophageal reflux disease with esophagitis, without bleeding: Secondary | ICD-10-CM | POA: Diagnosis not present

## 2023-11-07 DIAGNOSIS — E119 Type 2 diabetes mellitus without complications: Secondary | ICD-10-CM

## 2023-11-07 MED ORDER — OMEPRAZOLE 40 MG PO CPDR: 40.0000 mg | DELAYED_RELEASE_CAPSULE | Freq: Every day | ORAL | 0 refills | Status: DC

## 2023-11-07 MED ORDER — LANTUS SOLOSTAR 100 UNIT/ML ~~LOC~~ SOPN: 15.0000 [IU] | PEN_INJECTOR | Freq: Every day | SUBCUTANEOUS | 0 refills | Status: DC

## 2023-11-07 NOTE — Progress Notes (Signed)
 Virtual Visit Consent   [name], you are scheduled for a virtual visit with a [facility] provider today. Just as with appointments in the office, your consent must be obtained to participate. Your consent will be active for this visit and any virtual visit you may have with one of our providers in the next 365 days. If you have a MyChart account, a copy of this consent can be sent to you electronically.  As this is a virtual visit, video technology does not allow for your provider to perform a traditional examination. This may limit your provider's ability to fully assess your condition. If your provider identifies any concerns that need to be evaluated in person or the need to arrange testing (such as labs, EKG, etc.), we will make arrangements to do so. Although advances in technology are sophisticated, we cannot ensure that it will always work on either your end or our end. If the connection with a video visit is poor, the visit may have to be switched to a telephone visit. With either a video or telephone visit, we are not always able to ensure that we have a secure connection.  By engaging in this virtual visit, you consent to the provision of healthcare and authorize for your insurance to be billed (if applicable) for the services provided during this visit. Depending on your insurance coverage, you may receive a charge related to this service.  I need to obtain your verbal consent now. Are you willing to proceed with your visit today? [name] has provided verbal consent on 11/07/2023 for a virtual visit (video or telephone). [name], FNP  Date: 11/07/2023 2:00 PM   Virtual Visit via Video Note   I, [name], connected with  Logan Washington  ([id number], [birth year]) on 11/07/23 at  2:00 PM EDT by a video-enabled telemedicine application and verified that I am speaking with the correct person using two identifiers.  Location: Patient: Virtual Visit Location Patient:  Home Provider: Virtual Visit Location Provider: Home Office   I discussed the limitations of evaluation and management by telemedicine and the availability of in person appointments. The patient expressed understanding and agreed to proceed.    History of Present Illness: Logan Washington is a 23 y.o. who identifies as a male who was assigned male at birth, and is being seen today for refill on lantus . He has apptmt with pcp next month. His acid reflux has also been worse. He is taking tums but that is not helping as much. He requests a work note.   HPI: HPI  Problems:  Patient Active Problem List   Diagnosis Date Noted   Diarrhea 07/29/2023   Nausea 06/19/2023   Intractable vomiting 06/18/2023   Intractable nausea and vomiting 06/12/2023   Appendicitis 04/07/2023   Lab test positive for detection of COVID-19 virus 04/07/2023   Esophageal thickening 04/07/2023   Odynophagia 03/17/2021   Hypokalemia    DKA (diabetic ketoacidosis) (HCC) 03/15/2021   Dehydration 03/15/2021   Hematemesis 03/15/2021   Leukocytosis 03/15/2021   Hyperkalemia 03/15/2021   Influenza A 01/24/2018   Abdominal pain 01/24/2018   DKA, type 1 (HCC) 01/23/2018   AKI (acute kidney injury) 01/23/2018   Abnormal LFTs 01/23/2018   Chest pain 01/23/2018   Diabetes mellitus without complication (HCC)    Sports physical 10/25/2015    Allergies:  Allergies  Allergen Reactions   Banana Anaphylaxis   Tilapia [Fish Allergy] Swelling   Medications:  Current Outpatient Medications:    acetaminophen  (  TYLENOL ) 500 MG tablet, Take 2 tablets (1,000 mg total) by mouth every 6 (six) hours as needed for mild pain (pain score 1-3) or moderate pain (pain score 4-6) (or Fever >/= 101)., Disp: , Rfl:    BAQSIMI ONE PACK 3 MG/DOSE POWD, Place 1 spray into both nostrils as needed (Hypoglycemia)., Disp: , Rfl:    LANTUS  SOLOSTAR 100 UNIT/ML Solostar Pen, Inject 15 Units into the skin at bedtime., Disp: 3 mL, Rfl: 5   NOVOLOG   FLEXPEN 100 UNIT/ML FlexPen, Inject 0.5-12 Units into the skin 3 (three) times daily with meals. Sliding scale , Every 7 carbs =1 unit per patient, Disp: , Rfl: 11  Observations/Objective: Patient is well-developed, well-nourished in no acute distress.  Resting comfortably  at home.  Head is normocephalic, atraumatic.  No labored breathing.  Speech is clear and coherent with logical content.  Patient is alert and oriented at baseline.    Assessment and Plan: 1. Gastroesophageal reflux disease with esophagitis without hemorrhage (Primary)  Meds refilled, keep appmt with pcp, UC as needed.   Follow Up Instructions: I discussed the assessment and treatment plan with the patient. The patient was provided an opportunity to ask questions and all were answered. The patient agreed with the plan and demonstrated an understanding of the instructions.  A copy of instructions were sent to the patient via MyChart unless otherwise noted below.     The patient was advised to call back or seek an in-person evaluation if the symptoms worsen or if the condition fails to improve as anticipated.    [name], FNP

## 2023-11-07 NOTE — Patient Instructions (Signed)
 GERD in Adults: What to Know  Gastroesophageal reflux (GER) is when acid from your stomach flows up into your esophagus. Your esophagus is the part of your body that moves food from your mouth to your stomach. Normally, food goes down and stays in your stomach to be digested. But with GER, food and stomach acid may go back up. You may have a disease called gastroesophageal reflux disease (GERD) if the reflux: Happens often. Causes very bad symptoms. Makes your esophagus sore and swollen. Over time, GERD can make small holes called ulcers in the lining of your esophagus. What are the causes? GERD is caused by a problem with the muscle between your esophagus and stomach. This muscle is called the lower esophageal sphincter (LES). When it's weak or not normal, it doesn't close like it should. This means food and stomach acid can go back up into your esophagus. The muscle can be weak if: You smoke or use products with tobacco in them. You're pregnant. You have a type of hernia called a hiatal hernia. You eat certain foods and drinks. These include: Alcohol. Coffee. Chocolate. Onions. Peppermint. What increases the risk? Being overweight. Having a disease that affects your connective tissue. Taking NSAIDs, such as ibuprofen. What are the signs or symptoms? Heartburn. Trouble swallowing. Pain when you swallow. The feeling of having a lump in your throat. A bitter taste in your mouth. Bad breath. Having an upset or bloated stomach. Burping. Chest pain. Other conditions can also cause chest pain. Make sure you see your health care provider if you have chest pain. Wheezing. This is when you make high-pitched whistling sounds when you breathe, most often when you breathe out. A long-term cough or a cough at night. How is this diagnosed? GERD may be diagnosed based on your medical history and a physical exam. You may also have tests. These may include: An endoscopy. This test looks at your  stomach and esophagus with a small camera. A barium swallow test. This shows the shape and size of your esophagus and how well it's working. Tests of your esophagus to check for: Acid levels. Pressure. How is this treated? Treatment may depend on how bad your symptoms are. It may include: Changes to your diet and daily life. Medicines. Surgery. Follow these instructions at home: Eating and drinking Follow an eating plan as told by your provider. You may need to avoid certain foods and drinks. These may include: Coffee and tea, with or without caffeine. Alcohol. Energy drinks and sports drinks. Fizzy drinks or sodas. Chocolate and cocoa. Peppermint and mint flavorings. Garlic and onions. Horseradish. Spicy and acidic foods. These include: Peppers. Chili powder and curry powder. Vinegar. Hot sauces and BBQ sauce. Citrus fruits and juices. These include: Oranges. Lemons. Limes. Tomato-based foods. These include: Red sauce and pizza with red sauce. Chili. Salsa. Fried and fatty foods. These include: Donuts. [place] fries. Potato chips. High-fat dressings. High-fat meats. These include: Hot dogs and sausage. Rib eye steak. Ham and bacon. High-fat dairy items. These include: Whole milk. Butter. Cream cheese. Eat small meals often. Avoid eating big meals. Avoid drinking lots of liquid with your meals. Try not to eat meals during the 2-3 hours before bedtime. Try not to lie down right after you eat. Do not exercise right after you eat. Lifestyle  If you're overweight, lose an amount of weight that's healthy for you. Ask your provider about a safe weight loss goal. Do not smoke, vape, or use nicotine or tobacco. Wear  loose clothes. Do not wear things that are tight around your waist. When you sleep, try: Raising the head of your bed about 6 inches (15 cm). You can use a wedge to do this. Lying down on your left side. Try to lower your stress. If you need help doing  this, ask your provider. General instructions Take your medicines only as told. Do not take aspirin or ibuprofen unless you're told to. Watch for any changes in your symptoms. Do not bend over if it makes your symptoms worse. Contact a health care provider if: You have new symptoms. You have trouble: Drinking. Swallowing. Eating. It hurts to swallow. You have wheezing. You have a cough that won't go away. Your voice is hoarse. Your symptoms don't get better with treatment. Get help right away if: You have pain all of a sudden in your: Arm. Neck. Jaw. Teeth. Back. You feel sweaty, dizzy, or light-headed all of a sudden. You faint. You have chest pain or shortness of breath. You vomit and the vomit is: Green, yellow, or black. Looks like blood or coffee grounds. Your poop is red, bloody, or black. These symptoms may be an emergency. Call 911 right away. Do not wait to see if the symptoms will go away. Do not drive yourself to the hospital. This information is not intended to replace advice given to you by your health care provider. Make sure you discuss any questions you have with your health care provider. Document Revised: 12/10/2022 Document Reviewed: 06/26/2022 Elsevier Patient Education  2024 [employer].

## 2023-11-18 ENCOUNTER — Telehealth: Admitting: Physician Assistant

## 2023-11-18 DIAGNOSIS — G8929 Other chronic pain: Secondary | ICD-10-CM

## 2023-11-18 DIAGNOSIS — R61 Generalized hyperhidrosis: Secondary | ICD-10-CM | POA: Diagnosis not present

## 2023-11-18 DIAGNOSIS — M545 Low back pain, unspecified: Secondary | ICD-10-CM | POA: Diagnosis not present

## 2023-11-18 MED ORDER — MELOXICAM 15 MG PO TABS: 15.0000 mg | ORAL_TABLET | Freq: Every day | ORAL | 0 refills | Status: DC

## 2023-11-18 MED ORDER — CYCLOBENZAPRINE HCL 10 MG PO TABS: 10.0000 mg | ORAL_TABLET | Freq: Every day | ORAL | 0 refills | Status: DC

## 2023-11-18 NOTE — Patient Instructions (Signed)
  Cindee Hill, thank you for joining Elsie Velma Lunger, PA-C for today's virtual visit.  While this provider is not your primary care provider (PCP), if your PCP is located in our provider database this encounter information will be shared with them immediately following your visit.   A [facility] MyChart account gives you access to today's visit and all your visits, tests, and labs performed at [facility]  click here if you don't have a [facility] MyChart account or go to [email or web address]  Consent: (Patient) Kristy Catoe provided verbal consent for this virtual visit at the beginning of the encounter.  Current Medications:  Current Outpatient Medications:    cyclobenzaprine (FLEXERIL) 10 MG tablet, Take 1 tablet (10 mg total) by mouth at bedtime., Disp: 15 tablet, Rfl: 0   meloxicam (MOBIC) 15 MG tablet, Take 1 tablet (15 mg total) by mouth daily., Disp: 30 tablet, Rfl: 0   LANTUS  SOLOSTAR 100 UNIT/ML Solostar Pen, Inject 15 Units into the skin at bedtime., Disp: 4.5 mL, Rfl: 0   NOVOLOG  FLEXPEN 100 UNIT/ML FlexPen, Inject 0.5-12 Units into the skin 3 (three) times daily with meals. Sliding scale , Every 7 carbs =1 unit per patient, Disp: , Rfl: 11   Medications ordered in this encounter:  Meds ordered this encounter  Medications   meloxicam (MOBIC) 15 MG tablet    Sig: Take 1 tablet (15 mg total) by mouth daily.    Dispense:  30 tablet    Refill:  0    Supervising Provider:   LAMPTEY, PHILIP O [[id number]]   cyclobenzaprine (FLEXERIL) 10 MG tablet    Sig: Take 1 tablet (10 mg total) by mouth at bedtime.    Dispense:  15 tablet    Refill:  0    Supervising Provider:   LAMPTEY, PHILIP O [[id number]]     *If you need refills on other medications prior to your next appointment, please contact your pharmacy*  Follow-Up: Call back or seek an in-person evaluation if the symptoms worsen or if the condition fails to improve as anticipated.  [facility] [phone]  Other Instructions Please take prescribed medications as directed. You need to call your PCP office ASAP to schedule a follow-up as you need a further workup for each of these symptoms to rule out more concerning causes.   If you have been instructed to have an in-person evaluation today at a local Urgent Care facility, please use the link below. It will take you to a list of all of our available [facility], including address, phone number and hours of operation. Please do not delay care.  [facility]  If you or a family member do not have a primary care provider, use the link below to schedule a visit and establish care. When you choose a [facility] primary care physician or advanced practice provider, you gain a long-term partner in health. Find a Primary Care Provider  Learn more about [facility]'s in-office and virtual care options: [facility] - Get Care Now

## 2023-11-18 NOTE — Progress Notes (Signed)
 Virtual Visit Consent   Logan Washington, you are scheduled for a virtual visit with a [facility] provider today. Just as with appointments in the office, your consent must be obtained to participate. Your consent will be active for this visit and any virtual visit you may have with one of our providers in the next 365 days. If you have a MyChart account, a copy of this consent can be sent to you electronically.  As this is a virtual visit, video technology does not allow for your provider to perform a traditional examination. This may limit your provider's ability to fully assess your condition. If your provider identifies any concerns that need to be evaluated in person or the need to arrange testing (such as labs, EKG, etc.), we will make arrangements to do so. Although advances in technology are sophisticated, we cannot ensure that it will always work on either your end or our end. If the connection with a video visit is poor, the visit may have to be switched to a telephone visit. With either a video or telephone visit, we are not always able to ensure that we have a secure connection.  By engaging in this virtual visit, you consent to the provision of healthcare and authorize for your insurance to be billed (if applicable) for the services provided during this visit. Depending on your insurance coverage, you may receive a charge related to this service.  I need to obtain your verbal consent now. Are you willing to proceed with your visit today? [name] has provided verbal consent on 11/18/2023 for a virtual visit (video or telephone). Logan Washington, [place]  Date: 11/18/2023 9:05 AM   Virtual Visit via Video Note   I, Logan Washington, connected with  Logan Washington  ([id number], 20-May-2000) on 11/18/23 at  8:45 AM EDT by a video-enabled telemedicine application and verified that I am speaking with the correct person using two identifiers.  Location: Patient: Virtual Visit Location  Patient: Home Provider: Virtual Visit Location Provider: Home Office   I discussed the limitations of evaluation and management by telemedicine and the availability of in person appointments. The patient expressed understanding and agreed to proceed.    History of Present Illness: Logan Washington is a 23 y.o. who identifies as a male who was assigned male at birth, and is being seen today for multiple complaints  Endorses several month history of nighttime sweating, drenching the bed linens. This is associated with multiple night-time awakenings. Denies any known fever. Is a Type 1 diabetic but denies any nocturnal hypoglycemic episodes. Denis daytime sweating. No noted weight changes. Increased fatigue.  Chronic low back pain, worsening and now impacting ability to work at times. Sometimes with nighttime awakenings due to pain and stiffness. Occasional neck/upper back pain, but mainly localized to midline of lower back. Denies any known family history of AI issues. Takes OTC Ibuprofen with limited relief. Is in need of a work note today.  HPI: HPI  Problems:  Patient Active Problem List   Diagnosis Date Noted   Diarrhea 07/29/2023   Nausea 06/19/2023   Intractable vomiting 06/18/2023   Intractable nausea and vomiting 06/12/2023   Appendicitis 04/07/2023   Lab test positive for detection of COVID-19 virus 04/07/2023   Esophageal thickening 04/07/2023   Odynophagia 03/17/2021   Hypokalemia    DKA (diabetic ketoacidosis) (HCC) 03/15/2021   Dehydration 03/15/2021   Hematemesis 03/15/2021   Leukocytosis 03/15/2021   Hyperkalemia 03/15/2021   Influenza A 01/24/2018  Abdominal pain 01/24/2018   DKA, type 1 (HCC) 01/23/2018   AKI (acute kidney injury) 01/23/2018   Abnormal LFTs 01/23/2018   Chest pain 01/23/2018   Diabetes mellitus without complication (HCC)    Sports physical 10/25/2015    Allergies:  Allergies  Allergen Reactions   Banana Anaphylaxis   Tilapia [Fish Allergy]  Swelling   Medications:  Current Outpatient Medications:    cyclobenzaprine (FLEXERIL) 10 MG tablet, Take 1 tablet (10 mg total) by mouth at bedtime., Disp: 15 tablet, Rfl: 0   meloxicam (MOBIC) 15 MG tablet, Take 1 tablet (15 mg total) by mouth daily., Disp: 30 tablet, Rfl: 0   LANTUS  SOLOSTAR 100 UNIT/ML Solostar Pen, Inject 15 Units into the skin at bedtime., Disp: 4.5 mL, Rfl: 0   NOVOLOG  FLEXPEN 100 UNIT/ML FlexPen, Inject 0.5-12 Units into the skin 3 (three) times daily with meals. Sliding scale , Every 7 carbs =1 unit per patient, Disp: , Rfl: 11  Observations/Objective: Patient is well-developed, well-nourished in no acute distress.  Resting comfortably  at home.  Head is normocephalic, atraumatic.  No labored breathing.  Speech is clear and coherent with logical content.  Patient is alert and oriented at baseline.   Assessment and Plan: 1. Chronic midline low back pain without sciatica (Primary) - meloxicam (MOBIC) 15 MG tablet; Take 1 tablet (15 mg total) by mouth daily.  Dispense: 30 tablet; Refill: 0 - cyclobenzaprine (FLEXERIL) 10 MG tablet; Take 1 tablet (10 mg total) by mouth at bedtime.  Dispense: 15 tablet; Refill: 0  2. Night sweats  Needs further evaluation for both issues. Giving worsening of chronic back pain, midline with nighttime awakenings, especially coupled with chronic night sweats, he needs exam, imaging of the spine itself and lab workup. Will give Meloxicam and Flexeril to help with symptoms until he can get a follow-up with his PCP for further evaluation. ER precautions reviewed.   Follow Up Instructions: I discussed the assessment and treatment plan with the patient. The patient was provided an opportunity to ask questions and all were answered. The patient agreed with the plan and demonstrated an understanding of the instructions.  A copy of instructions were sent to the patient via MyChart unless otherwise noted below.   The patient was advised to call  back or seek an in-person evaluation if the symptoms worsen or if the condition fails to improve as anticipated.    Logan Velma Lunger, PA-C

## 2023-12-04 ENCOUNTER — Encounter: Payer: Self-pay | Admitting: Physician Assistant

## 2023-12-04 ENCOUNTER — Telehealth: Admitting: Physician Assistant

## 2023-12-04 DIAGNOSIS — Z0289 Encounter for other administrative examinations: Secondary | ICD-10-CM | POA: Diagnosis not present

## 2023-12-04 NOTE — Patient Instructions (Signed)
  Cindee Hill, thank you for joining Elsie Velma Lunger, PA-C for today's virtual visit.  While this provider is not your primary care provider (PCP), if your PCP is located in our provider database this encounter information will be shared with them immediately following your visit.   A [facility] MyChart account gives you access to today's visit and all your visits, tests, and labs performed at [facility]  click here if you don't have a [facility] MyChart account or go to [email or web address]  Consent: (Patient) Logan Washington provided verbal consent for this virtual visit at the beginning of the encounter.  Current Medications:  Current Outpatient Medications:    cyclobenzaprine (FLEXERIL) 10 MG tablet, Take 1 tablet (10 mg total) by mouth at bedtime., Disp: 15 tablet, Rfl: 0   LANTUS  SOLOSTAR 100 UNIT/ML Solostar Pen, Inject 15 Units into the skin at bedtime., Disp: 4.5 mL, Rfl: 0   meloxicam (MOBIC) 15 MG tablet, Take 1 tablet (15 mg total) by mouth daily., Disp: 30 tablet, Rfl: 0   NOVOLOG  FLEXPEN 100 UNIT/ML FlexPen, Inject 0.5-12 Units into the skin 3 (three) times daily with meals. Sliding scale , Every 7 carbs =1 unit per patient, Disp: , Rfl: 11   Medications ordered in this encounter:  No orders of the defined types were placed in this encounter.    *If you need refills on other medications prior to your next appointment, please contact your pharmacy*  Follow-Up: Call back or seek an in-person evaluation if the symptoms worsen or if the condition fails to improve as anticipated.  [facility] [phone]  Other Instructions Note has been provided. Any further work notes for this issue will need to come from your Ortho or PCP.   If you have been instructed to have an in-person evaluation today at a local Urgent Care facility, please use the link below. It will take you to a list of all of our available [facility],  including address, phone number and hours of operation. Please do not delay care.  [facility]  If you or a family member do not have a primary care provider, use the link below to schedule a visit and establish care. When you choose a [facility] primary care physician or advanced practice provider, you gain a long-term partner in health. Find a Primary Care Provider  Learn more about [facility]'s in-office and virtual care options:  - Get Care Now

## 2023-12-04 NOTE — Progress Notes (Signed)
 Virtual Visit Consent   Logan Washington, you are scheduled for a virtual visit with a [facility] provider today. Just as with appointments in the office, your consent must be obtained to participate. Your consent will be active for this visit and any virtual visit you may have with one of our providers in the next 365 days. If you have a MyChart account, a copy of this consent can be sent to you electronically.  As this is a virtual visit, video technology does not allow for your provider to perform a traditional examination. This may limit your provider's ability to fully assess your condition. If your provider identifies any concerns that need to be evaluated in person or the need to arrange testing (such as labs, EKG, etc.), we will make arrangements to do so. Although advances in technology are sophisticated, we cannot ensure that it will always work on either your end or our end. If the connection with a video visit is poor, the visit may have to be switched to a telephone visit. With either a video or telephone visit, we are not always able to ensure that we have a secure connection.  By engaging in this virtual visit, you consent to the provision of healthcare and authorize for your insurance to be billed (if applicable) for the services provided during this visit. Depending on your insurance coverage, you may receive a charge related to this service.  I need to obtain your verbal consent now. Are you willing to proceed with your visit today? Logan Washington has provided verbal consent on 12/04/2023 for a virtual visit (video or telephone). Logan Washington, [place]  Date: 12/04/2023 8:24 AM   Virtual Visit via Video Note   I, Logan Washington, connected with  Logan Washington  ([id number], October 31, 2000) on 12/04/23 at  8:15 AM EDT by a video-enabled telemedicine application and verified that I am speaking with the correct person using two identifiers.  Location: Patient: Virtual Visit  Location Patient: Home Provider: Virtual Visit Location Provider: Home Office   I discussed the limitations of evaluation and management by telemedicine and the availability of in person appointments. The patient expressed understanding and agreed to proceed.    History of Present Illness: Logan Washington is a 23 y.o. who identifies as a male who was assigned male at birth, and is being seen today for request of note for employer. Was evaluated on 10/7 by this provider for some acute on chronic back pain at which time muscle relaxant and NSAID were started with instruction he needed in-person evaluation for further assessment. He was taken out of work 10/7-10/8. Notes he was evaluated in person having x-rays this past Monday which he were told were normal. Has been set up to see Ortho on 11/4, but was told by work that he needs a note on file stating he is ok to return to work without restrictions, despite already being back at work.  HPI: HPI  Problems:  Patient Active Problem List   Diagnosis Date Noted   Diarrhea 07/29/2023   Nausea 06/19/2023   Intractable vomiting 06/18/2023   Intractable nausea and vomiting 06/12/2023   Appendicitis 04/07/2023   Lab test positive for detection of COVID-19 virus 04/07/2023   Esophageal thickening 04/07/2023   Odynophagia 03/17/2021   Hypokalemia    DKA (diabetic ketoacidosis) (HCC) 03/15/2021   Dehydration 03/15/2021   Hematemesis 03/15/2021   Leukocytosis 03/15/2021   Hyperkalemia 03/15/2021   Influenza A 01/24/2018   Abdominal pain 01/24/2018  DKA, type 1 (HCC) 01/23/2018   AKI (acute kidney injury) 01/23/2018   Abnormal LFTs 01/23/2018   Chest pain 01/23/2018   Diabetes mellitus without complication (HCC)    Sports physical 10/25/2015    Allergies:  Allergies  Allergen Reactions   Banana Anaphylaxis   Tilapia [Fish Allergy] Swelling   Medications:  Current Outpatient Medications:    cyclobenzaprine (FLEXERIL) 10 MG tablet, Take 1  tablet (10 mg total) by mouth at bedtime., Disp: 15 tablet, Rfl: 0   LANTUS  SOLOSTAR 100 UNIT/ML Solostar Pen, Inject 15 Units into the skin at bedtime., Disp: 4.5 mL, Rfl: 0   meloxicam (MOBIC) 15 MG tablet, Take 1 tablet (15 mg total) by mouth daily., Disp: 30 tablet, Rfl: 0   NOVOLOG  FLEXPEN 100 UNIT/ML FlexPen, Inject 0.5-12 Units into the skin 3 (three) times daily with meals. Sliding scale , Every 7 carbs =1 unit per patient, Disp: , Rfl: 11  Observations/Objective: Patient is well-developed, well-nourished in no acute distress.  Resting comfortably  at home.  Head is normocephalic, atraumatic.  No labored breathing.  Speech is clear and coherent with logical content.  Patient is alert and oriented at baseline.   Assessment and Plan: 1. Encounter to obtain excuse from work (Primary)  Provided note detailing days written out (10/7-10/8) and provided note regarding no restrictions. He is aware we are not covering any additional days of absences outside of 10/7-10/8 and will have to follow-up with Orthopedics regarding any further need for absence related to his back.  Follow Up Instructions: I discussed the assessment and treatment plan with the patient. The patient was provided an opportunity to ask questions and all were answered. The patient agreed with the plan and demonstrated an understanding of the instructions.  A copy of instructions were sent to the patient via MyChart unless otherwise noted below.   The patient was advised to call back or seek an in-person evaluation if the symptoms worsen or if the condition fails to improve as anticipated.    Logan Velma Lunger, PA-C

## 2024-01-31 ENCOUNTER — Telehealth: Admitting: Family Medicine

## 2024-01-31 DIAGNOSIS — A084 Viral intestinal infection, unspecified: Secondary | ICD-10-CM | POA: Diagnosis not present

## 2024-01-31 NOTE — Progress Notes (Signed)
 " Virtual Visit Consent   Logan Washington, you are scheduled for a virtual visit with a [facility] provider today. Just as with appointments in the office, your consent must be obtained to participate. Your consent will be active for this visit and any virtual visit you may have with one of our providers in the next 365 days. If you have a MyChart account, a copy of this consent can be sent to you electronically.  As this is a virtual visit, video technology does not allow for your provider to perform a traditional examination. This may limit your provider's ability to fully assess your condition. If your provider identifies any concerns that need to be evaluated in person or the need to arrange testing (such as labs, EKG, etc.), we will make arrangements to do so. Although advances in technology are sophisticated, we cannot ensure that it will always work on either your end or our end. If the connection with a video visit is poor, the visit may have to be switched to a telephone visit. With either a video or telephone visit, we are not always able to ensure that we have a secure connection.  By engaging in this virtual visit, you consent to the provision of healthcare and authorize for your insurance to be billed (if applicable) for the services provided during this visit. Depending on your insurance coverage, you may receive a charge related to this service.  I need to obtain your verbal consent now. Are you willing to proceed with your visit today? Nichole Keltner has provided verbal consent on 01/31/2024 for a virtual visit (video or telephone). [name], FNP  Date: 01/31/2024 12:04 PM   Virtual Visit via Video Note   I, [name], connected with  Logan Washington  ([id number], 04-21-2000) on 01/31/2024 at 12:00 PM EST by a video-enabled telemedicine application and verified that I am speaking with the correct person using two identifiers.  Location: Patient: Virtual Visit Location Patient:  Home Provider: Virtual Visit Location Provider: Home Office   I discussed the limitations of evaluation and management by telemedicine and the availability of in person appointments. The patient expressed understanding and agreed to proceed.    History of Present Illness: Logan Washington is a 23 y.o. who identifies as a male who was assigned male at birth, and is being seen today for nausea, vomiting and diarrhea. He had to leave work. No fever. He has ondansetron  and took it. He need note oow today. He is not in distress.   HPI: HPI  Problems:  Patient Active Problem List   Diagnosis Date Noted   Diarrhea 07/29/2023   Nausea 06/19/2023   Intractable vomiting 06/18/2023   Intractable nausea and vomiting 06/12/2023   Appendicitis 04/07/2023   Lab test positive for detection of COVID-19 virus 04/07/2023   Esophageal thickening 04/07/2023   Odynophagia 03/17/2021   Hypokalemia    DKA (diabetic ketoacidosis) (HCC) 03/15/2021   Dehydration 03/15/2021   Hematemesis 03/15/2021   Leukocytosis 03/15/2021   Hyperkalemia 03/15/2021   Influenza A 01/24/2018   Abdominal pain 01/24/2018   DKA, type 1 (HCC) 01/23/2018   AKI (acute kidney injury) 01/23/2018   Abnormal LFTs 01/23/2018   Chest pain 01/23/2018   Diabetes mellitus without complication (HCC)    Sports physical 10/25/2015    Allergies: Allergies[1] Medications: Current Medications[2]  Observations/Objective: Patient is well-developed, well-nourished in no acute distress.  Resting comfortably  at home.  Head is normocephalic, atraumatic.  No labored breathing.  Speech is  clear and coherent with logical content.  Patient is alert and oriented at baseline.    Assessment and Plan: 1. Viral gastroenteritis (Primary)  Increase fluids, no milk or dairy, UC as needed.  Follow Up Instructions: I discussed the assessment and treatment plan with the patient. The patient was provided an opportunity to ask questions and all were  answered. The patient agreed with the plan and demonstrated an understanding of the instructions.  A copy of instructions were sent to the patient via MyChart unless otherwise noted below.     The patient was advised to call back or seek an in-person evaluation if the symptoms worsen or if the condition fails to improve as anticipated.    Tarri Guilfoil, FNP     [1]  Allergies Allergen Reactions   Banana Anaphylaxis   Tilapia [Fish Allergy] Swelling  [2]  Current Outpatient Medications:    cyclobenzaprine  (FLEXERIL ) 10 MG tablet, Take 1 tablet (10 mg total) by mouth at bedtime., Disp: 15 tablet, Rfl: 0   LANTUS  SOLOSTAR 100 UNIT/ML Solostar Pen, Inject 15 Units into the skin at bedtime., Disp: 4.5 mL, Rfl: 0   meloxicam  (MOBIC ) 15 MG tablet, Take 1 tablet (15 mg total) by mouth daily., Disp: 30 tablet, Rfl: 0   NOVOLOG  FLEXPEN 100 UNIT/ML FlexPen, Inject 0.5-12 Units into the skin 3 (three) times daily with meals. Sliding scale , Every 7 carbs =1 unit per patient, Disp: , Rfl: 11  "

## 2024-01-31 NOTE — Patient Instructions (Signed)

## 2024-02-16 ENCOUNTER — Encounter (HOSPITAL_COMMUNITY): Payer: Self-pay

## 2024-02-16 ENCOUNTER — Emergency Department (HOSPITAL_COMMUNITY)

## 2024-02-16 ENCOUNTER — Other Ambulatory Visit: Payer: Self-pay

## 2024-02-16 ENCOUNTER — Observation Stay (HOSPITAL_COMMUNITY)
Admission: EM | Admit: 2024-02-16 | Discharge: 2024-02-17 | Disposition: A | Discharge status: Home / Self Care | Attending: Emergency Medicine | Admitting: Emergency Medicine

## 2024-02-16 DIAGNOSIS — F17210 Nicotine dependence, cigarettes, uncomplicated: Secondary | ICD-10-CM | POA: Insufficient documentation

## 2024-02-16 DIAGNOSIS — R197 Diarrhea, unspecified: Secondary | ICD-10-CM

## 2024-02-16 DIAGNOSIS — Z794 Long term (current) use of insulin: Secondary | ICD-10-CM | POA: Insufficient documentation

## 2024-02-16 DIAGNOSIS — F1290 Cannabis use, unspecified, uncomplicated: Secondary | ICD-10-CM | POA: Diagnosis not present

## 2024-02-16 DIAGNOSIS — D72829 Elevated white blood cell count, unspecified: Secondary | ICD-10-CM | POA: Diagnosis not present

## 2024-02-16 DIAGNOSIS — E860 Dehydration: Secondary | ICD-10-CM | POA: Diagnosis not present

## 2024-02-16 DIAGNOSIS — R112 Nausea with vomiting, unspecified: Secondary | ICD-10-CM

## 2024-02-16 DIAGNOSIS — E873 Alkalosis: Secondary | ICD-10-CM

## 2024-02-16 DIAGNOSIS — R1084 Generalized abdominal pain: Secondary | ICD-10-CM | POA: Diagnosis not present

## 2024-02-16 DIAGNOSIS — R109 Unspecified abdominal pain: Secondary | ICD-10-CM | POA: Diagnosis present

## 2024-02-16 DIAGNOSIS — E1010 Type 1 diabetes mellitus with ketoacidosis without coma: Secondary | ICD-10-CM | POA: Diagnosis not present

## 2024-02-16 DIAGNOSIS — R739 Hyperglycemia, unspecified: Secondary | ICD-10-CM

## 2024-02-16 DIAGNOSIS — E1110 Type 2 diabetes mellitus with ketoacidosis without coma: Secondary | ICD-10-CM | POA: Diagnosis present

## 2024-02-16 LAB — I-STAT VENOUS BLOOD GAS, ED
Acid-base deficit: 5 mmol/L — ABNORMAL HIGH (ref 0.0–2.0)
Bicarbonate: 16.1 mmol/L — ABNORMAL LOW (ref 20.0–28.0)
Calcium, Ion: 1.08 mmol/L — ABNORMAL LOW (ref 1.15–1.40)
HCT: 52 % (ref 39.0–52.0)
Hemoglobin: 17.7 g/dL — ABNORMAL HIGH (ref 13.0–17.0)
O2 Saturation: 93 %
Potassium: 5.2 mmol/L — ABNORMAL HIGH (ref 3.5–5.1)
Sodium: 134 mmol/L — ABNORMAL LOW (ref 135–145)
TCO2: 17 mmol/L — ABNORMAL LOW (ref 22–32)
pCO2, Ven: 22 mmHg — ABNORMAL LOW (ref 44–60)
pH, Ven: 7.472 — ABNORMAL HIGH (ref 7.25–7.43)
pO2, Ven: 60 mmHg — ABNORMAL HIGH (ref 32–45)

## 2024-02-16 LAB — COMPREHENSIVE METABOLIC PANEL WITH GFR
ALT: 15 U/L (ref 0–44)
AST: 24 U/L (ref 15–41)
Albumin: 4.9 g/dL (ref 3.5–5.0)
Alkaline Phosphatase: 90 U/L (ref 38–126)
Anion gap: 25 — ABNORMAL HIGH (ref 5–15)
BUN: 16 mg/dL (ref 6–20)
CO2: 15 mmol/L — ABNORMAL LOW (ref 22–32)
Calcium: 10.1 mg/dL (ref 8.9–10.3)
Chloride: 94 mmol/L — ABNORMAL LOW (ref 98–111)
Creatinine, Ser: 1.11 mg/dL (ref 0.61–1.24)
GFR, Estimated: >60 mL/min
Glucose, Bld: 359 mg/dL — ABNORMAL HIGH (ref 70–99)
Potassium: 5.5 mmol/L — ABNORMAL HIGH (ref 3.5–5.1)
Sodium: 134 mmol/L — ABNORMAL LOW (ref 135–145)
Total Bilirubin: 2.1 mg/dL — ABNORMAL HIGH (ref 0.0–1.2)
Total Protein: 8.3 g/dL — ABNORMAL HIGH (ref 6.5–8.1)

## 2024-02-16 LAB — CBC
HCT: 48.7 % (ref 39.0–52.0)
Hemoglobin: 17.3 g/dL — ABNORMAL HIGH (ref 13.0–17.0)
MCH: 29.7 pg (ref 26.0–34.0)
MCHC: 35.5 g/dL (ref 30.0–36.0)
MCV: 83.5 fL (ref 80.0–100.0)
Platelets: 441 K/uL — ABNORMAL HIGH (ref 150–400)
RBC: 5.83 MIL/uL — ABNORMAL HIGH (ref 4.22–5.81)
RDW: 12.3 % (ref 11.5–15.5)
WBC: 20.5 K/uL — ABNORMAL HIGH (ref 4.0–10.5)
nRBC: 0 % (ref 0.0–0.2)

## 2024-02-16 LAB — BETA-HYDROXYBUTYRIC ACID: Beta-Hydroxybutyric Acid: 2.76 mmol/L — ABNORMAL HIGH (ref 0.05–0.27)

## 2024-02-16 LAB — CBG MONITORING, ED
Glucose-Capillary: 226 mg/dL — ABNORMAL HIGH (ref 70–99)
Glucose-Capillary: 265 mg/dL — ABNORMAL HIGH (ref 70–99)
Glucose-Capillary: 295 mg/dL — ABNORMAL HIGH (ref 70–99)
Glucose-Capillary: 358 mg/dL — ABNORMAL HIGH (ref 70–99)

## 2024-02-16 LAB — I-STAT CHEM 8, ED
BUN: 17 mg/dL (ref 6–20)
Calcium, Ion: 1.09 mmol/L — ABNORMAL LOW (ref 1.15–1.40)
Chloride: 101 mmol/L (ref 98–111)
Creatinine, Ser: 0.9 mg/dL (ref 0.61–1.24)
Glucose, Bld: 362 mg/dL — ABNORMAL HIGH (ref 70–99)
HCT: 52 % (ref 39.0–52.0)
Hemoglobin: 17.7 g/dL — ABNORMAL HIGH (ref 13.0–17.0)
Potassium: 5.3 mmol/L — ABNORMAL HIGH (ref 3.5–5.1)
Sodium: 134 mmol/L — ABNORMAL LOW (ref 135–145)
TCO2: 17 mmol/L — ABNORMAL LOW (ref 22–32)

## 2024-02-16 LAB — LIPASE, BLOOD: Lipase: 11 U/L (ref 11–51)

## 2024-02-16 LAB — SALICYLATE LEVEL: Salicylate Lvl: <7 mg/dL — ABNORMAL LOW (ref 7.0–30.0)

## 2024-02-16 LAB — ETHANOL: Alcohol, Ethyl (B): <15 mg/dL

## 2024-02-16 LAB — ACETAMINOPHEN LEVEL: Acetaminophen (Tylenol), Serum: <10 ug/mL — ABNORMAL LOW (ref 10–30)

## 2024-02-16 MED ORDER — LACTATED RINGERS IV SOLN: INTRAVENOUS | Status: DC

## 2024-02-16 MED ORDER — IOHEXOL 350 MG/ML SOLN
75.0000 mL | Freq: Once | INTRAVENOUS | Status: AC | PRN
  Administered 2024-02-16: 75 mL via INTRAVENOUS

## 2024-02-16 MED ORDER — INSULIN REGULAR(HUMAN) IN NACL 100-0.9 UT/100ML-% IV SOLN
INTRAVENOUS | Status: DC
  Administered 2024-02-17: 4.4 [IU]/h via INTRAVENOUS

## 2024-02-16 MED ORDER — LACTATED RINGERS IV BOLUS
20.0000 mL/kg | Freq: Once | INTRAVENOUS | Status: AC
  Administered 2024-02-16: 1542 mL via INTRAVENOUS

## 2024-02-16 MED ORDER — DROPERIDOL 2.5 MG/ML IJ SOLN
1.2500 mg | Freq: Once | INTRAMUSCULAR | Status: AC
  Administered 2024-02-16: 1.25 mg via INTRAVENOUS
  Filled 2024-02-16: qty 2

## 2024-02-16 MED ORDER — INSULIN REGULAR(HUMAN) IN NACL 100-0.9 UT/100ML-% IV SOLN
INTRAVENOUS | Status: DC
  Administered 2024-02-16: 11 [IU]/h via INTRAVENOUS
  Filled 2024-02-16: qty 100

## 2024-02-16 MED ORDER — DEXTROSE IN LACTATED RINGERS 5 % IV SOLN: INTRAVENOUS | Status: DC

## 2024-02-16 MED ORDER — DEXTROSE 50 % IV SOLN: 0.0000 mL | INTRAVENOUS | Status: DC | PRN

## 2024-02-16 MED ORDER — LACTATED RINGERS IV BOLUS
1000.0000 mL | Freq: Once | INTRAVENOUS | Status: AC
  Administered 2024-02-16: 1000 mL via INTRAVENOUS

## 2024-02-16 NOTE — Assessment & Plan Note (Signed)
 Likely related to DKA CT abdomen nonacute

## 2024-02-16 NOTE — Subjective & Objective (Signed)
 Patient has been having high blood sugar vomiting and diarrhea for 1 day History of type 1 diabetes 4 admissions for DKA Has been having abdominal pain and cramps Has not been eating well Since this morning started to feel more short of breath.  Has Not missed any insulin  no cough no flulike symptoms but has been around coworkers who have been ill.  EMS gave 500 normal saline bolus and 4 mg of IV Zofran  On CBG check was 386 patient appears to be tachypneic In ER and VBG actually showing mild alkalosis bicarb 17 salicylate ethanol and Tylenol  negative leukocytosis up to 20 Noted on insulin  drip CT abdomen nonacute chest x-ray nonacute

## 2024-02-16 NOTE — ED Provider Triage Note (Signed)
 Emergency Medicine Provider Triage Evaluation Note  Logan Washington , a 24 y.o. male w/ T1DM, THC abuse and h/o CHS who was evaluated in triage.  Pt complains of nausea/vomiting/abdominal pain and trouble breathing that started this AM. Hasn't missed any doses of insulin . No cough/flu-like sxs prior to this. EMS gave 500 NS bolus, 4 mg IV zofran . CBG 386/310 mg/dL.  Review of Systems  Positive: N/V, abd pain, SOB Negative: F/c, flu-like sxs, CP  Physical Exam  BP (!) 154/86   Pulse 95   Resp 18   Ht 5' 10 (1.778 m)   Wt 77.1 kg   SpO2 100%   BMI 24.39 kg/m  Gen:   Awake, very uncomfortable appearing Resp:  Normal effort, mild tachypnea MSK:   Moves extremities without difficulty  Other:  vomiting  Medical Decision Making  Medically screening exam initiated at 8:04 PM.  Appropriate orders placed.  Logan Washington was informed that the remainder of the evaluation will be completed by another provider, this initial triage assessment does not replace that evaluation, and the importance of remaining in the ED until their evaluation is complete.  C/f DKA. D/w charge nurse. Patient will be moved to a treatment space. Labs/imaging ordered.   Logan Logan Washington SAILOR, MD 02/16/24 2007

## 2024-02-16 NOTE — Assessment & Plan Note (Signed)
 Will rehydrate and see if improves patient appears to be hemoconcentrated

## 2024-02-16 NOTE — ED Triage Notes (Signed)
 C/O high blood sugar & vomiting & diarrhea x 1 day  20 L AC  500 NS bolus, 4 mg zofran   CBG 386/310

## 2024-02-16 NOTE — ED Notes (Signed)
 Patient transported to CT

## 2024-02-16 NOTE — Progress Notes (Signed)
" °  Subjective Patient ID: Logan Washington is a 24 y.o. male.  Chief Complaint  Patient presents with   Nurse Visit    C/o N/V and no meals for last 2 days.     The following information was reviewed by members of the visit team:  Allergies  Meds      HPI - Pt w/ Hx of Type 1 DM w/ multiple cases of DKA reports c/o stomach cramps and nausea.  Review of Systems - Mild HTN noted. Other VSS. Afebrile, AAO x3, eyes PERRLA. Pt states he just moved into his own house over holidays but hasn't been able to afford much food. Has not eaten in 2 days. Pt states he is aware of what happens to him when he does not eat and believes this to be related. Pt admits to diarrhea yesterday with vomiting today. Pt admits to being around sick coworkers for past several weeks (Flu, COVID, RSV).  Objective Physical Exam - BG - 197. Witnessed case of vomiting. Sweat broke out on forehead post vomitus. Hyper motile sounds all Q's. No pain or tenderness to abdominal palpation. No flank pain. Negative for Rovsings, Dunphy's an McBurney's signs.  Assessment/Plan Diagnoses and all orders for this visit:  Unspecified conditions influencing health status -Bismuth Subsalycilate (Pepto bismol) per standing orders but not in hospital orderable section w/ moderate results in stomach upset and nausea decrease. -RTW  Electronically signed: Tanda JONELLE Pool, RN 02/16/2024  1:54 PM   "

## 2024-02-16 NOTE — Assessment & Plan Note (Signed)
 Will rehydrate aggressively

## 2024-02-16 NOTE — Assessment & Plan Note (Signed)
Order gastric panel ? ?

## 2024-02-16 NOTE — ED Provider Notes (Signed)
 " Logan Washington Provider Note   HPI/ROS    History obtained from patient.  Logan Washington is a 24 y.o. male who presents for Hyperglycemia and Emesis and who  has a past medical history of ADHD (attention deficit hyperactivity disorder), Diabetes (HCC), and Diabetes mellitus without complication (HCC).  Patient presenting today with complaints of nausea, vomiting, abdominal pain, and difficulty breathing.  States the difficulty breathing started just over the last several hours but the rest of the symptoms have been throughout the day.  Endorses THC use yesterday.  States he has not been able to eat much over the last 2 days.  States he was able to eat dinner last night.  States he woke up this morning and throughout the day the symptoms got worse.  States he was unable to make it through a full day work.  Endorses maybe sick contacts at work, but denies any recent sick symptoms.  With EMS glucoses were in the high 300s so he was given 500 of NS and 4 Zofran .  MDM   I have reviewed the nursing documentation, vital signs, as well as the past medical history, surgical history, family history, and social history.  Initial Assessment:  Hemodynamically stable on initial evaluation but tachypneic and tachycardic.  Could possibly be DKA given elevated blood glucose at home.  Also endorsing generalized abdominal pain with recurrent vomiting.  Very limited history due to patient not feeling well currently, so we will obtain broad workup including CBC, CMP, UA, lipase, BHB, VBG, and tox labs.  Will also obtain CT abdomen pelvis with contrast.  Patient given fluids at this time to start for rehydration.  CT abdomen and pelvis no acute findings.  VBG resulted with mild alkalosis.  Given patient's presentation consistent with DKA with a low bicarb of 17.  Also likely has concomitant respiratory alkalosis and contraction alkalosis in the setting of recent vomiting and  diarrhea.  Otherwise salicylates, ethanol, and Tylenol  negative.  CMP with mild hypochloremia and hyponatremia with a bicarb of 15.  Glucose elevated 359.  CBC with leukocytosis to 20.5 with elevated hemoglobin and thrombocytosis.  Could be all contraction in the setting of dehydration.  Patient started on insulin  drip.  Seems to be in mild DKA.  Will plan for admission given DKA on labs.  Otherwise has largely unremarkable workup thus far.  Patient admitted to the inpatient service for further workup and management.  Disposition:  I discussed the case with Dr.Doutova who graciously agreed to admit the patient to their service for continued care.    This patient was staffed with Dr. Patt who supervised the visit and agreed with the plan of care.   Due to the patients current presenting symptoms, physical exam findings, and the workup stated above, it is thought that the etiology of the patients current presentation is:  1. Hyperglycemia   2. Nausea and vomiting, unspecified vomiting type     Clinical Complexity A medically appropriate history, review of systems, and physical exam was performed.  Factors that affect the complexity of this encounter: assessment of correct protocol and laboratory work from this visit  My independent interpretations of diagnostic studies are documented in the ED course above.   If decision rules were used in this patient's evaluation, they are listed below.   Click here for ABCD2, HEART and other calculators  Patient's presentation is most consistent with acute presentation with potential threat to life or bodily function.  MDM generated using voice dictation software and may contain dictation errors. Please contact me for any clarification or with any questions.    Physical Exam, PMH, PSH, Family History, and Social Hsitory   Vitals:   02/16/24 1940 02/16/24 2045 02/16/24 2200 02/16/24 2230  BP:   (!) 185/92 137/70  Pulse:  94 98 (!) 116  Resp:   (!) 32 (!) 23 (!) 21  SpO2:  100% 100% 100%  Weight: 77.1 kg     Height: 5' 10 (1.778 m)       Physical Exam Constitutional:      Appearance: He is ill-appearing.  HENT:     Head: Normocephalic and atraumatic.     Mouth/Throat:     Mouth: Mucous membranes are dry.  Eyes:     Extraocular Movements: Extraocular movements intact.     Conjunctiva/sclera: Conjunctivae normal.  Cardiovascular:     Rate and Rhythm: Normal rate and regular rhythm.  Pulmonary:     Effort: Pulmonary effort is normal. Tachypnea present.     Breath sounds: No wheezing or rales.  Abdominal:     General: Abdomen is flat.     Palpations: Abdomen is soft.     Tenderness: There is abdominal tenderness. There is no guarding or rebound.  Musculoskeletal:     Right lower leg: No edema.     Left lower leg: No edema.  Skin:    General: Skin is warm and dry.     Capillary Refill: Capillary refill takes less than 2 seconds.  Neurological:     General: No focal deficit present.     Mental Status: He is oriented to person, place, and time.     Past Medical History:  Diagnosis Date   ADHD (attention deficit hyperactivity disorder)    Diabetes (HCC)    Diabetes mellitus without complication (HCC)      Past Surgical History:  Procedure Laterality Date   APPENDECTOMY     ESOPHAGOGASTRODUODENOSCOPY (EGD) WITH PROPOFOL  N/A 03/18/2021   Procedure: ESOPHAGOGASTRODUODENOSCOPY (EGD) WITH PROPOFOL ;  Surgeon: Rosalie Kitchens, MD;  Location: [facility];  Service: Endoscopy;  Laterality: N/A;   LAPAROSCOPIC APPENDECTOMY N/A 04/08/2023   Procedure: APPENDECTOMY LAPAROSCOPIC;  Surgeon: Belinda Cough, MD;  Location: [facility] OR;  Service: General;  Laterality: N/A;   TONSILLECTOMY       Family History  Problem Relation Age of Onset   Breast cancer Mother    Lung cancer Mother    Colon cancer Maternal Grandmother    Sudden death Neg Hx    Heart attack Neg Hx    Stomach cancer Neg Hx    Pancreatic cancer Neg Hx     Esophageal cancer Neg Hx    Rectal cancer Neg Hx     Social History   Tobacco Use   Smoking status: Some Days    Types: Cigarettes   Smokeless tobacco: Never  Substance Use Topics   Alcohol use: Never     Procedures   If procedures were preformed on this patient, they are listed below:  Procedures   Electronically signed by:   Glendia Carlin Ancona, M.D. PGY-2, Emergency Medicine   Please note that this documentation was produced with the assistance of voice-to-text technology and may contain errors.    Ancona Glendia, MD 02/16/24 2334    Patt Alm Macho, MD 02/17/24 1505  "

## 2024-02-16 NOTE — H&P (Signed)
 "    Logan Washington FMW:[id number] DOB: [birth year] DOA: 02/16/2024    PCP: Franchot Mom, PA-C      Patient arrived to ER on 02/16/24 at 1941 Referred by Attending Patt Alm Macho, MD   Patient coming from:    home Lives alone,      Chief Complaint:   Chief Complaint  Patient presents with   Hyperglycemia   Emesis    HPI: Logan Washington is a 24 y.o. male with medical history significant of Dm1, cannabinoid hyperemesis    Presented with nausea vomiting diarrhea Patient has been having high blood sugar vomiting and diarrhea for 1 day History of type 1 diabetes 4 admissions for DKA Has been having abdominal pain and cramps Has not been eating well Since this morning started to feel more short of breath.  Has Not missed any insulin  no cough no flulike symptoms but has been around coworkers who have been ill.  EMS gave 500 normal saline bolus and 4 mg of IV Zofran  On CBG check was 386 patient appears to be tachypneic In ER and VBG actually showing mild alkalosis bicarb 17 salicylate ethanol and Tylenol  negative leukocytosis up to 20 Noted on insulin  drip CT abdomen nonacute chest x-ray nonacute     Denies significant ETOH intake   Does  smoke occasionally not interested in quitting  Occasional marijuana use      Regarding pertinent Chronic problems:        DM 1 -  Lab Results  Component Value Date   HGBA1C 7.3 (H) 04/08/2023  on insulin , While in ER:       Lab Orders         CBC         Urinalysis, Routine w reflex microscopic -Urine, Clean Catch         Beta-hydroxybutyric acid         Comprehensive metabolic panel         Lipase, blood         Ethanol         Acetaminophen  level         Salicylate level         CBG monitoring, ED         I-Stat venous blood gas, ([facility] ED, [facility], [facility])         I-stat chem 8, ed         CBG monitoring, ED         I-stat chem 8, ED (not at [facility], [facility] or [facility])       CXR -  NON acute  CTabd/pelvis -  nonacute   Following  Medications were ordered in ER: Medications  insulin  regular, human (MYXREDLIN ) 100 units/ 100 mL infusion (11 Units/hr Intravenous New Bag/Given 02/16/24 2228)  lactated ringers  infusion (has no administration in time range)  dextrose  5 % in lactated ringers  infusion (has no administration in time range)  dextrose  50 % solution 0-50 mL (has no administration in time range)  lactated ringers  bolus 1,000 mL (1,000 mLs Intravenous New Bag/Given 02/16/24 2021)  droperidol  (INAPSINE ) 2.5 MG/ML injection 1.25 mg (1.25 mg Intravenous Given 02/16/24 2104)  lactated ringers  bolus 1,542 mL (1,542 mLs Intravenous New Bag/Given 02/16/24 2108)  iohexol  (OMNIPAQUE ) 350 MG/ML injection 75 mL (75 mLs Intravenous Contrast Given 02/16/24 2203)    ___________   ED Triage Vitals  Encounter Vitals Group     BP 02/16/24 1930 (!) 154/86     Girls Systolic BP Percentile --  Girls Diastolic BP Percentile --      Boys Systolic BP Percentile --      Boys Diastolic BP Percentile --      Pulse Rate 02/16/24 1930 95     Resp 02/16/24 1930 18     Temp --      Temp src --      SpO2 02/16/24 1930 100 %     Weight 02/16/24 1940 170 lb (77.1 kg)     Height 02/16/24 1940 5' 10 (1.778 m)     Head Circumference --      Peak Flow --      Pain Score 02/16/24 1940 10     Pain Loc --      Pain Education --      Exclude from Growth Chart --   UFJK(75)@     _________________________________________ Significant initial  Findings: Abnormal Labs Reviewed  CBC - Abnormal; Notable for the following components:      Result Value   WBC 20.5 (*)    RBC 5.83 (*)    Hemoglobin 17.3 (*)    Platelets 441 (*)    All other components within normal limits  BETA-HYDROXYBUTYRIC ACID - Abnormal; Notable for the following components:   Beta-Hydroxybutyric Acid 2.76 (*)    All other components within normal limits  COMPREHENSIVE METABOLIC PANEL WITH GFR - Abnormal; Notable for the following components:   Sodium 134 (*)    Potassium  5.5 (*)    Chloride 94 (*)    CO2 15 (*)    Glucose, Bld 359 (*)    Total Protein 8.3 (*)    Total Bilirubin 2.1 (*)    Anion gap 25 (*)    All other components within normal limits  ACETAMINOPHEN  LEVEL - Abnormal; Notable for the following components:   Acetaminophen  (Tylenol ), Serum <10 (*)    All other components within normal limits  SALICYLATE LEVEL - Abnormal; Notable for the following components:   Salicylate Lvl <7.0 (*)    All other components within normal limits  CBG MONITORING, ED - Abnormal; Notable for the following components:   Glucose-Capillary 358 (*)    All other components within normal limits  I-STAT VENOUS BLOOD GAS, ED - Abnormal; Notable for the following components:   pH, Ven 7.472 (*)    pCO2, Ven 22.0 (*)    pO2, Ven 60 (*)    Bicarbonate 16.1 (*)    TCO2 17 (*)    Acid-base deficit 5.0 (*)    Sodium 134 (*)    Potassium 5.2 (*)    Calcium, Ion 1.08 (*)    Hemoglobin 17.7 (*)    All other components within normal limits  I-STAT CHEM 8, ED - Abnormal; Notable for the following components:   Sodium 134 (*)    Potassium 5.3 (*)    Glucose, Bld 362 (*)    Calcium, Ion 1.09 (*)    TCO2 17 (*)    Hemoglobin 17.7 (*)    All other components within normal limits  CBG MONITORING, ED - Abnormal; Notable for the following components:   Glucose-Capillary 295 (*)    All other components within normal limits  CBG MONITORING, ED - Abnormal; Notable for the following components:   Glucose-Capillary 265 (*)    All other components within normal limits      ECG: Ordered    The recent clinical data is shown below. Vitals:   02/16/24 1940 02/16/24 2045 02/16/24 2200 02/16/24 2230  BP:   (!) 185/92 137/70  Pulse:  94 98 (!) 116  Resp:  (!) 32 (!) 23 (!) 21  SpO2:  100% 100% 100%  Weight: 77.1 kg     Height: 5' 10 (1.778 m)        WBC     Component Value Date/Time   WBC 20.5 (H) 02/16/2024 2015   LYMPHSABS 0.9 07/29/2023 1218   MONOABS 0.3  07/29/2023 1218   EOSABS 0.0 07/29/2023 1218   BASOSABS 0.0 07/29/2023 1218     UA   ordered   Results for orders placed or performed during the hospital encounter of 07/29/23  MRSA Next Gen by PCR, Nasal     Status: None   Collection Time: 07/29/23  5:00 PM   Specimen: Nasal Mucosa; Nasal Swab  Result Value Ref Range Status   MRSA by PCR Next Gen NOT DETECTED NOT DETECTED Final    Comment: (NOTE) The GeneXpert MRSA Assay (FDA approved for NASAL specimens only), is one component of a comprehensive MRSA colonization surveillance program. It is not intended to diagnose MRSA infection nor to guide or monitor treatment for MRSA infections. Test performance is not FDA approved in patients less than 24 years old. Performed at [facility], [address] [place], [place] [address]    _______________________________________________________   Venous  Blood Gas result:  pH  7.472 High  Sodium 134 Low  mmol/L   pCO2, Ven 22.0 Low  mmHg Potassium 5.2 High  mmol/L  pO2, Ven 60 High  mmHg        __________________________________________________________ Recent Labs  Lab 02/16/24 2015 02/16/24 2024  NA 134* 134*  134*  K 5.5* 5.2*  5.3*  CO2 15*  --   GLUCOSE 359* 362*  BUN 16 17  CREATININE 1.11 0.90  CALCIUM 10.1  --     Cr    stable,  Lab Results  Component Value Date   CREATININE 0.90 02/16/2024   CREATININE 1.11 02/16/2024   CREATININE 0.93 07/30/2023    Recent Labs  Lab 02/16/24 2015  AST 24  ALT 15  ALKPHOS 90  BILITOT 2.1*  PROT 8.3*  ALBUMIN 4.9   Lab Results  Component Value Date   CALCIUM 10.1 02/16/2024   PHOS 3.1 07/30/2023    Plt: Lab Results  Component Value Date   PLT 441 (H) 02/16/2024    Recent Labs  Lab 02/16/24 2015 02/16/24 2024  WBC 20.5*  --   HGB 17.3* 17.7*  17.7*  HCT 48.7 52.0  52.0  MCV 83.5  --   PLT 441*  --     HG/HCT  stable,       Component Value Date/Time   HGB 17.7 (H) 02/16/2024 2024    HGB 17.7 (H) 02/16/2024 2024   HCT 52.0 02/16/2024 2024   HCT 52.0 02/16/2024 2024   MCV 83.5 02/16/2024 2015     Recent Labs  Lab 02/16/24 2015  LIPASE 11   No results for input(s): AMMONIA in the last 168 hours.    _______________________________________________ Hospitalist was called for admission for   Nausea and vomiting, unspecified vomiting type, mild DKA, dehydration      The following Work up has been ordered so far:  Orders Placed This Encounter  Procedures   DG Chest Portable 1 View   CT ABDOMEN PELVIS W CONTRAST   CBC   Urinalysis, Routine w reflex microscopic -Urine, Clean Catch   Beta-hydroxybutyric acid   Comprehensive metabolic  panel   Lipase, blood   Ethanol   Acetaminophen  level   Salicylate level   Diet NPO time specified   Saline Lock IV, Maintain IV access   ED Cardiac monitoring   ED Cardiac monitoring   Initiate Carrier Fluid Protocol   K+ > 5 mEq/L and/or K+ addressed separately   Consult for [facility] Admission   Pulse oximetry, continuous   ED Pulse oximetry, continuous   CBG monitoring, ED   I-Stat venous blood gas, ([facility] ED, [facility], [facility])   I-stat chem 8, ed   CBG monitoring, ED   I-stat chem 8, ED (not at [facility], [facility] or [facility])     OTHER Significant initial  Findings:  labs showing:     DM  labs:  HbA1C: Recent Labs    04/08/23 0442  HGBA1C 7.3*      CBG (last 3)  Recent Labs    02/16/24 2015 02/16/24 2224 02/16/24 2313  GLUCAP 358* 295* 265*          Cultures:    Component Value Date/Time   SDES  01/23/2018 1817    LEFT ANTECUBITAL Performed at [facility], [address] [place], [place] [address]    [facility]  01/23/2018 1817    BOTTLES DRAWN AEROBIC AND ANAEROBIC Blood Culture adequate volume Performed at [facility] [address], [place], [place] [address]    CULT  01/23/2018 1817    NO GROWTH 5 DAYS Performed at [facility], [address], [place], [place]  [address]    REPTSTATUS 01/28/2018 FINAL 01/23/2018 1817     Radiological Exams on Admission: CT ABDOMEN PELVIS W CONTRAST Result Date: 02/16/2024 EXAM: CT ABDOMEN AND PELVIS WITH CONTRAST 02/16/2024 10:03:14 PM TECHNIQUE: CT of the abdomen and pelvis was performed with the administration of 75 mL of iohexol  (OMNIPAQUE ) 350 MG/ML injection. Multiplanar reformatted images are provided for review. Automated exposure control, iterative reconstruction, and/or weight-based adjustment of the mA/kV was utilized to reduce the radiation dose to as low as reasonably achievable. COMPARISON: None available. CLINICAL HISTORY: Abdominal pain, acute, nonlocalized. FINDINGS: LOWER CHEST: No acute abnormality. LIVER: The liver is unremarkable. GALLBLADDER AND BILE DUCTS: Gallbladder is unremarkable. No biliary ductal dilatation. SPLEEN: No acute abnormality. PANCREAS: No acute abnormality. ADRENAL GLANDS: No acute abnormality. KIDNEYS, URETERS AND BLADDER: No stones in the kidneys or ureters. No hydronephrosis. No perinephric or periureteral stranding. Urinary bladder is unremarkable. GI AND BOWEL: Stomach demonstrates no acute abnormality. There is no bowel obstruction. PERITONEUM AND RETROPERITONEUM: No ascites. No free air. VASCULATURE: Aorta is normal in caliber. LYMPH NODES: No lymphadenopathy. REPRODUCTIVE ORGANS: No acute abnormality. BONES AND SOFT TISSUES: No acute osseous abnormality. No focal soft tissue abnormality. IMPRESSION: 1. No acute findings in the abdomen or pelvis. Electronically signed by: Franky Crease MD 02/16/2024 10:20 PM EST RP Workstation: [id number]   DG Chest Portable 1 View Result Date: 02/16/2024 CLINICAL DATA:  Short of breath EXAM: PORTABLE CHEST 1 VIEW COMPARISON:  03/15/2021 FINDINGS: The heart size and mediastinal contours are within normal limits. Both lungs are clear. The visualized skeletal structures are unremarkable. IMPRESSION: No active disease. Electronically Signed   By: Luke Bun  M.D.   On: 02/16/2024 21:12   _______________________________________________________________________________________________________ Latest  Blood pressure 137/70, pulse (!) 116, resp. rate (!) 21, height 5' 10 (1.778 m), weight 77.1 kg, SpO2 100%.   Vitals  labs and radiology finding personally reviewed  Review of Systems:  Pertinent positives include:  fatigue,  , abdominal pain, nausea, vomiting, diarrhea, Constitutional:  No weight loss, night sweats, Fevers, chills, weight loss  HEENT:  No headaches, Difficulty swallowing,Tooth/dental problems,Sore throat,  No sneezing, itching, ear ache, nasal congestion, post nasal drip,  Cardio-vascular:  No chest pain, Orthopnea, PND, anasarca, dizziness, palpitations.no Bilateral lower extremity swelling  GI:  No heartburn, indigestion change in bowel habits, loss of appetite, melena, blood in stool, hematemesis Resp:  no shortness of breath at rest. No dyspnea on exertion, No excess mucus, no productive cough, No non-productive cough, No coughing up of blood.No change in color of mucus.No wheezing. Skin:  no rash or lesions. No jaundice GU:  no dysuria, change in color of urine, no urgency or frequency. No straining to urinate.  No flank pain.  Musculoskeletal:  No joint pain or no joint swelling. No decreased range of motion. No back pain.  Psych:  No change in mood or affect. No depression or anxiety. No memory loss.  Neuro: no localizing neurological complaints, no tingling, no weakness, no double vision, no gait abnormality, no slurred speech, no confusion  All systems reviewed and apart from HOPI all are negative _______________________________________________________________________________________________ Past Medical History:   Past Medical History:  Diagnosis Date   ADHD (attention deficit hyperactivity disorder)    Diabetes (HCC)    Diabetes mellitus without complication (HCC)       Past Surgical History:   Procedure Laterality Date   APPENDECTOMY     ESOPHAGOGASTRODUODENOSCOPY (EGD) WITH PROPOFOL  N/A 03/18/2021   Procedure: ESOPHAGOGASTRODUODENOSCOPY (EGD) WITH PROPOFOL ;  Surgeon: Rosalie Kitchens, MD;  Location: [facility];  Service: Endoscopy;  Laterality: N/A;   LAPAROSCOPIC APPENDECTOMY N/A 04/08/2023   Procedure: APPENDECTOMY LAPAROSCOPIC;  Surgeon: Belinda Cough, MD;  Location: [facility] OR;  Service: General;  Laterality: N/A;   TONSILLECTOMY      Social History:  Ambulatory   independently      reports that he has been smoking cigarettes. He has never used smokeless tobacco. He reports current drug use. Drug: Marijuana. He reports that he does not drink alcohol.   Family History:   Family History  Problem Relation Age of Onset   Breast cancer Mother    Lung cancer Mother    Colon cancer Maternal Grandmother    Sudden death Neg Hx    Heart attack Neg Hx    Stomach cancer Neg Hx    Pancreatic cancer Neg Hx    Esophageal cancer Neg Hx    Rectal cancer Neg Hx    ______________________________________________________________________________________________ Allergies: Allergies[1]   Prior to Admission medications  Medication Sig Start Date End Date Taking? Authorizing Provider  cyclobenzaprine  (FLEXERIL ) 10 MG tablet Take 1 tablet (10 mg total) by mouth at bedtime. 11/18/23   Gladis Elsie BROCKS, PA-C  LANTUS  SOLOSTAR 100 UNIT/ML Solostar Pen Inject 15 Units into the skin at bedtime. 11/07/23 12/07/23  Blair, Diane W, FNP  meloxicam  (MOBIC ) 15 MG tablet Take 1 tablet (15 mg total) by mouth daily. 11/18/23   Gladis Elsie BROCKS, PA-C  NOVOLOG  FLEXPEN 100 UNIT/ML FlexPen Inject 0.5-12 Units into the skin 3 (three) times daily with meals. Sliding scale , Every 7 carbs =1 unit per patient 10/09/15   [provider]    ___________________________________________________________________________________________________ Physical Exam:    02/16/2024   10:30 PM 02/16/2024   10:00 PM 02/16/2024     8:45 PM  Vitals with BMI  Systolic 137 185   Diastolic 70 92   Pulse 116 98 94  1. General:  in No  Acute distress   Chronically ill  -appearing 2. Psychological: Alert and   Oriented 3. Head/ENT: Dry Mucous Membranes                          Head Non traumatic, neck supple                           Poor Dentition 4. SKIN: decreased Skin turgor,  Skin clean Dry and intact no rash    5. Heart: Regular rate and rhythm no  Murmur, no Rub or gallop 6. Lungs: no wheezes or crackles   7. Abdomen: Soft,  non-tender, Non distended thin bowel sounds present 8. Lower extremities: no clubbing, cyanosis, no  edema 9. Neurologically Grossly intact, moving all 4 extremities equally  10. MSK: Normal range of motion    Chart has been reviewed  ______________________________________________________________________________________________  Assessment/Plan 24 y.o. male with medical history significant of Dm1, cannabinoid hyperemesis    Admitted for  Nausea and vomiting, unspecified vomiting type, mild DKA, dehydration    Present on Admission:  DKA, type 1, not at goal [facility])  Abdominal pain  Dehydration  DKA (diabetic ketoacidosis) (HCC)  Leukocytosis  Diarrhea  Metabolic alkalosis   Abdominal pain Likely related to DKA CT abdomen nonacute  Dehydration Will rehydrate aggressively  DKA (diabetic ketoacidosis) (HCC) will admit per DKA/ l, obtain serial BMET, start on glucosestabalizer, aggressive IVF.   Change IVF to D5 1/2Na after BG <250 .  So far work up of possible causes of DKA/HSS with CXR, ECG one set of cardiac enzymes, UA.  Most likely cause been poor PO intake Monitor in Stepdown. Replace potassium as needed.   Consult diabetes coordinator    Leukocytosis Will rehydrate and see if improves patient appears to be hemoconcentrated  Diarrhea Order gastric panel  Metabolic alkalosis Metabolic alkalosis likely in the setting of severe dehydration nausea vomiting  diarrhea rehydrate and follow fluid status and acid-base status   Other plan as per orders.  DVT prophylaxis:  SCD     Code Status:    Code Status: Prior FULL CODE as per patient   I had personally discussed CODE STATUS with patient   ACP   none   Family Communication:   Family not at  Bedside    Diet  Diet Orders (From admission, onward)     Start     Ordered   02/17/24 0000  Diet NPO time specified  (Diabetes Ketoacidosis (DKA))  Diet effective now        02/16/24 2359            Disposition Plan:       To home once workup is complete and patient is stable   Following barriers for discharge:                                                         Electrolytes corrected  Pain controlled with PO medications                                                        Will need to be able to tolerate PO                                      Diabetes care coordinator                                  Consults called:    NONE   Admission status:  ED Disposition     ED Disposition  Admit   Condition  --   Comment  Hospital Area: [facility] [[id number]]  Level of Care: Progressive [102]  Admit to Progressive based on following criteria: GI, ENDOCRINE disease patients with GI bleeding, acute liver failure or pancreatitis, stable with diabetic ketoacidosis or thyrotoxicosis (hypothyroid) state.  May place patient in observation at [facility] or [facility] if equivalent level of care is available:: No  Diagnosis: DKA, type 1, not at goal [facility]) [[id number]]  Admitting Physician: Latoshia Monrroy [[id number]]  Attending Physician: Zakariya Knickerbocker [[id number]]           Obs      Level of care      progressive      tele indefinitely please discontinue once patient no longer qualifies COVID-19 Labs      Maeson Lourenco 02/17/2024, 12:32 AM    [facility]     after 2 AM please page floor  coverage   If 7AM-7PM, please contact the day team taking care of the patient using [email or web address]        [1]  Allergies Allergen Reactions   Banana Anaphylaxis   Tilapia [Fish Allergy] Swelling   "

## 2024-02-16 NOTE — Assessment & Plan Note (Signed)
 will admit per DKA/ l, obtain serial BMET, start on glucosestabalizer, aggressive IVF.   Change IVF to D5 1/2Na after BG <250 .  So far work up of possible causes of DKA/HSS with CXR, ECG one set of cardiac enzymes, UA.  Most likely cause been poor PO intake Monitor in Stepdown. Replace potassium as needed.   Consult diabetes coordinator

## 2024-02-17 ENCOUNTER — Encounter (HOSPITAL_COMMUNITY): Payer: Self-pay | Admitting: Internal Medicine

## 2024-02-17 DIAGNOSIS — E873 Alkalosis: Secondary | ICD-10-CM | POA: Diagnosis present

## 2024-02-17 LAB — MAGNESIUM: Magnesium: 1.6 mg/dL — ABNORMAL LOW (ref 1.7–2.4)

## 2024-02-17 LAB — CBC WITH DIFFERENTIAL/PLATELET
Abs Immature Granulocytes: 0.06 K/uL (ref 0.00–0.07)
Basophils Absolute: 0 K/uL (ref 0.0–0.1)
Basophils Relative: 0 %
Eosinophils Absolute: 0 K/uL (ref 0.0–0.5)
Eosinophils Relative: 0 %
HCT: 45.2 % (ref 39.0–52.0)
Hemoglobin: 15.9 g/dL (ref 13.0–17.0)
Immature Granulocytes: 0 %
Lymphocytes Relative: 6 %
Lymphs Abs: 1 K/uL (ref 0.7–4.0)
MCH: 30 pg (ref 26.0–34.0)
MCHC: 35.2 g/dL (ref 30.0–36.0)
MCV: 85.3 fL (ref 80.0–100.0)
Monocytes Absolute: 0.3 K/uL (ref 0.1–1.0)
Monocytes Relative: 2 %
Neutro Abs: 14.9 K/uL — ABNORMAL HIGH (ref 1.7–7.7)
Neutrophils Relative %: 92 %
Platelets: 352 K/uL (ref 150–400)
RBC: 5.3 MIL/uL (ref 4.22–5.81)
RDW: 12.2 % (ref 11.5–15.5)
WBC: 16.2 K/uL — ABNORMAL HIGH (ref 4.0–10.5)
nRBC: 0 % (ref 0.0–0.2)

## 2024-02-17 LAB — BASIC METABOLIC PANEL WITH GFR
Anion gap: 16 — ABNORMAL HIGH (ref 5–15)
Anion gap: 11 (ref 5–15)
BUN: 16 mg/dL (ref 6–20)
BUN: 13 mg/dL (ref 6–20)
CO2: 22 mmol/L (ref 22–32)
CO2: 23 mmol/L (ref 22–32)
Calcium: 9.7 mg/dL (ref 8.9–10.3)
Calcium: 9 mg/dL (ref 8.9–10.3)
Chloride: 99 mmol/L (ref 98–111)
Chloride: 102 mmol/L (ref 98–111)
Creatinine, Ser: 1.08 mg/dL (ref 0.61–1.24)
Creatinine, Ser: 0.99 mg/dL (ref 0.61–1.24)
GFR, Estimated: >60 mL/min
GFR, Estimated: >60 mL/min
Glucose, Bld: 192 mg/dL — ABNORMAL HIGH (ref 70–99)
Glucose, Bld: 145 mg/dL — ABNORMAL HIGH (ref 70–99)
Potassium: 4 mmol/L (ref 3.5–5.1)
Potassium: 4.1 mmol/L (ref 3.5–5.1)
Sodium: 137 mmol/L (ref 135–145)
Sodium: 135 mmol/L (ref 135–145)

## 2024-02-17 LAB — URINALYSIS, ROUTINE W REFLEX MICROSCOPIC
Bacteria, UA: NONE SEEN
Bilirubin Urine: NEGATIVE
Glucose, UA: >=500 mg/dL — AB
Hgb urine dipstick: NEGATIVE
Ketones, ur: 80 mg/dL — AB
Leukocytes,Ua: NEGATIVE
Nitrite: NEGATIVE
Protein, ur: NEGATIVE mg/dL
Specific Gravity, Urine: 1.026 (ref 1.005–1.030)
pH: 6 (ref 5.0–8.0)

## 2024-02-17 LAB — COMPREHENSIVE METABOLIC PANEL WITH GFR
ALT: 10 U/L (ref 0–44)
AST: 24 U/L (ref 15–41)
Albumin: 4.1 g/dL (ref 3.5–5.0)
Alkaline Phosphatase: 65 U/L (ref 38–126)
Anion gap: 10 (ref 5–15)
BUN: 14 mg/dL (ref 6–20)
CO2: 23 mmol/L (ref 22–32)
Calcium: 9.4 mg/dL (ref 8.9–10.3)
Chloride: 102 mmol/L (ref 98–111)
Creatinine, Ser: 0.99 mg/dL (ref 0.61–1.24)
GFR, Estimated: >60 mL/min
Glucose, Bld: 116 mg/dL — ABNORMAL HIGH (ref 70–99)
Potassium: 4.7 mmol/L (ref 3.5–5.1)
Sodium: 135 mmol/L (ref 135–145)
Total Bilirubin: 1.1 mg/dL (ref 0.0–1.2)
Total Protein: 6.8 g/dL (ref 6.5–8.1)

## 2024-02-17 LAB — URINE DRUG SCREEN
Amphetamines: NEGATIVE
Barbiturates: NEGATIVE
Benzodiazepines: NEGATIVE
Cocaine: NEGATIVE
Fentanyl: NEGATIVE
Methadone Scn, Ur: NEGATIVE
Opiates: NEGATIVE
Tetrahydrocannabinol: POSITIVE — AB

## 2024-02-17 LAB — CBG MONITORING, ED
Glucose-Capillary: 117 mg/dL — ABNORMAL HIGH (ref 70–99)
Glucose-Capillary: 140 mg/dL — ABNORMAL HIGH (ref 70–99)
Glucose-Capillary: 144 mg/dL — ABNORMAL HIGH (ref 70–99)
Glucose-Capillary: 155 mg/dL — ABNORMAL HIGH (ref 70–99)
Glucose-Capillary: 189 mg/dL — ABNORMAL HIGH (ref 70–99)
Glucose-Capillary: 192 mg/dL — ABNORMAL HIGH (ref 70–99)
Glucose-Capillary: 192 mg/dL — ABNORMAL HIGH (ref 70–99)
Glucose-Capillary: 195 mg/dL — ABNORMAL HIGH (ref 70–99)

## 2024-02-17 LAB — CBC
HCT: 42.2 % (ref 39.0–52.0)
Hemoglobin: 15 g/dL (ref 13.0–17.0)
MCH: 29.9 pg (ref 26.0–34.0)
MCHC: 35.5 g/dL (ref 30.0–36.0)
MCV: 84.1 fL (ref 80.0–100.0)
Platelets: 367 K/uL (ref 150–400)
RBC: 5.02 MIL/uL (ref 4.22–5.81)
RDW: 12.5 % (ref 11.5–15.5)
WBC: 14.7 K/uL — ABNORMAL HIGH (ref 4.0–10.5)
nRBC: 0 % (ref 0.0–0.2)

## 2024-02-17 LAB — I-STAT CHEM 8, ED
BUN: 20 mg/dL (ref 6–20)
Calcium, Ion: 1.15 mmol/L (ref 1.15–1.40)
Chloride: 101 mmol/L (ref 98–111)
Creatinine, Ser: 1 mg/dL (ref 0.61–1.24)
Glucose, Bld: 193 mg/dL — ABNORMAL HIGH (ref 70–99)
HCT: 46 % (ref 39.0–52.0)
Hemoglobin: 15.6 g/dL (ref 13.0–17.0)
Potassium: 4.9 mmol/L (ref 3.5–5.1)
Sodium: 137 mmol/L (ref 135–145)
TCO2: 26 mmol/L (ref 22–32)

## 2024-02-17 LAB — LACTIC ACID, PLASMA: Lactic Acid, Venous: 3.8 mmol/L (ref 0.5–1.9)

## 2024-02-17 LAB — HEMOGLOBIN A1C
Hgb A1c MFr Bld: 9 % — ABNORMAL HIGH (ref 4.8–5.6)
Mean Plasma Glucose: 211.6 mg/dL

## 2024-02-17 LAB — PHOSPHORUS
Phosphorus: 3.1 mg/dL (ref 2.5–4.6)
Phosphorus: 1.8 mg/dL — ABNORMAL LOW (ref 2.5–4.6)

## 2024-02-17 LAB — CK: Total CK: 104 U/L (ref 49–397)

## 2024-02-17 LAB — BETA-HYDROXYBUTYRIC ACID: Beta-Hydroxybutyric Acid: 0.54 mmol/L — ABNORMAL HIGH (ref 0.05–0.27)

## 2024-02-17 MED ORDER — FENTANYL CITRATE (PF) 100 MCG/2ML IJ SOLN
INTRAMUSCULAR | Status: AC
  Filled 2024-02-17: qty 2

## 2024-02-17 MED ORDER — LANTUS SOLOSTAR 100 UNIT/ML ~~LOC~~ SOPN: 28.0000 [IU] | PEN_INJECTOR | Freq: Every day | SUBCUTANEOUS | Status: DC

## 2024-02-17 MED ORDER — MAGNESIUM SULFATE 2 GM/50ML IV SOLN
2.0000 g | Freq: Once | INTRAVENOUS | Status: AC
  Administered 2024-02-17: 2 g via INTRAVENOUS
  Filled 2024-02-17: qty 50

## 2024-02-17 MED ORDER — ACETAMINOPHEN 325 MG PO TABS: 650.0000 mg | ORAL_TABLET | Freq: Four times a day (QID) | ORAL | Status: DC | PRN

## 2024-02-17 MED ORDER — NOVOLOG FLEXPEN 100 UNIT/ML ~~LOC~~ SOPN: 15.0000 [IU] | PEN_INJECTOR | Freq: Three times a day (TID) | SUBCUTANEOUS | 3 refills | Status: AC

## 2024-02-17 MED ORDER — SODIUM CHLORIDE 0.9 % IV SOLN: INTRAVENOUS | Status: DC

## 2024-02-17 MED ORDER — LIDOCAINE 2% (20 MG/ML) 5 ML SYRINGE
INTRAMUSCULAR | Status: AC
  Filled 2024-02-17: qty 5

## 2024-02-17 MED ORDER — ACETAMINOPHEN 650 MG RE SUPP: 650.0000 mg | Freq: Four times a day (QID) | RECTAL | Status: DC | PRN

## 2024-02-17 MED ORDER — ONDANSETRON HCL 4 MG PO TABS: 4.0000 mg | ORAL_TABLET | Freq: Four times a day (QID) | ORAL | Status: DC | PRN

## 2024-02-17 MED ORDER — MIDAZOLAM HCL 2 MG/2ML IJ SOLN
INTRAMUSCULAR | Status: AC
  Filled 2024-02-17: qty 2

## 2024-02-17 MED ORDER — LANTUS SOLOSTAR 100 UNIT/ML ~~LOC~~ SOPN: 28.0000 [IU] | PEN_INJECTOR | Freq: Every day | SUBCUTANEOUS | 0 refills | Status: AC

## 2024-02-17 MED ORDER — INSULIN ASPART 100 UNIT/ML IJ SOLN
0.0000 [IU] | INTRAMUSCULAR | Status: DC
  Administered 2024-02-17: 2 [IU] via SUBCUTANEOUS
  Administered 2024-02-17: 1 [IU] via SUBCUTANEOUS
  Filled 2024-02-17: qty 2

## 2024-02-17 MED ORDER — SUCRALFATE 1 G PO TABS
1.0000 g | ORAL_TABLET | Freq: Three times a day (TID) | ORAL | Status: DC
  Administered 2024-02-17 (×2): 1 g via ORAL
  Filled 2024-02-17 (×2): qty 1

## 2024-02-17 MED ORDER — HYDROCODONE-ACETAMINOPHEN 5-325 MG PO TABS: 1.0000 | ORAL_TABLET | ORAL | Status: DC | PRN

## 2024-02-17 MED ORDER — INSULIN PEN NEEDLE 32G X 8 MM MISC: 0 refills | Status: AC

## 2024-02-17 MED ORDER — POTASSIUM PHOSPHATES 15 MMOLE/5ML IV SOLN
20.0000 mmol | Freq: Once | INTRAVENOUS | Status: AC
  Administered 2024-02-17: 20 mmol via INTRAVENOUS
  Filled 2024-02-17: qty 6.67

## 2024-02-17 MED ORDER — INSULIN GLARGINE-YFGN 100 UNIT/ML ~~LOC~~ SOLN
20.0000 [IU] | Freq: Every day | SUBCUTANEOUS | Status: DC
  Administered 2024-02-17: 20 [IU] via SUBCUTANEOUS
  Filled 2024-02-17 (×2): qty 0.2

## 2024-02-17 MED ORDER — SODIUM CHLORIDE 0.9 % IV BOLUS
1000.0000 mL | Freq: Once | INTRAVENOUS | Status: AC
  Administered 2024-02-17: 1000 mL via INTRAVENOUS

## 2024-02-17 MED ORDER — PANTOPRAZOLE SODIUM 40 MG IV SOLR
40.0000 mg | Freq: Two times a day (BID) | INTRAVENOUS | Status: DC
  Administered 2024-02-17 (×2): 40 mg via INTRAVENOUS
  Filled 2024-02-17 (×2): qty 10

## 2024-02-17 MED ORDER — ONDANSETRON HCL 4 MG/2ML IJ SOLN: 4.0000 mg | Freq: Four times a day (QID) | INTRAMUSCULAR | Status: DC | PRN

## 2024-02-17 MED ORDER — FENTANYL CITRATE (PF) 50 MCG/ML IJ SOSY: 25.0000 ug | PREFILLED_SYRINGE | INTRAMUSCULAR | Status: DC | PRN

## 2024-02-17 MED ORDER — PROPOFOL 10 MG/ML IV BOLUS
INTRAVENOUS | Status: AC
  Filled 2024-02-17: qty 20

## 2024-02-17 MED ORDER — ONDANSETRON HCL 4 MG/2ML IJ SOLN
INTRAMUSCULAR | Status: AC
  Filled 2024-02-17: qty 2

## 2024-02-17 NOTE — ED Notes (Addendum)
Pt was walked to bathroom.

## 2024-02-17 NOTE — Assessment & Plan Note (Signed)
 Metabolic alkalosis likely in the setting of severe dehydration nausea vomiting diarrhea rehydrate and follow fluid status and acid-base status

## 2024-02-17 NOTE — Inpatient Diabetes Management (Signed)
 Inpatient Diabetes Program Recommendations  AACE/ADA: New Consensus Statement on Inpatient Glycemic Control (2015)  Target Ranges:  Prepandial:   less than 140 mg/dL      Peak postprandial:   less than 180 mg/dL (1-2 hours)      Critically ill patients:  140 - 180 mg/dL   Lab Results  Component Value Date   GLUCAP 195 (H) 02/17/2024   HGBA1C 9.0 (H) 02/17/2024    Review of Glycemic Control  Diabetes history: DM type 1 since age 24 Outpatient Diabetes medications: Lantus  28 units, Novolog  SSI CHO 1:7 Current orders for Inpatient glycemic control:  Semglee  20 units qhs Novolog  0-9 units Q4  A1c 9% on 1/6  Spoke with pt at bedside informed him of current A1c. Pt reports suppose to follow up with his Endocrinologist 2 weeks after his previous appointment (12/16/2023, meter ordered at that time and a Dexcom G7)  but was unable to due to job and transportation. Pt has plans to make a follow appointment.  Thanks,  Clotilda Bull RN, MSN, BC-ADM Inpatient Diabetes Coordinator Team Pager [phone] (8a-5p)

## 2024-02-17 NOTE — Hospital Course (Addendum)
 SABRA

## 2024-02-17 NOTE — TOC CM/SW Note (Signed)
 TOC consult received for medication assistance. Patient has health insurance on file therefore Match voucher cannot be given. Patient seen by DM coordinator. No further CM needs reported at this time. CM remains available if needed for d/c planning needs.   Merilee Batty, MSN, RN Case Management [phone]

## 2024-02-17 NOTE — Discharge Summary (Signed)
 "  Physician Discharge Summary  Logan Washington FMW:[id number] DOB: 10-23-2000 DOA: 02/16/2024  PCP: Franchot Mom, PA-C  Admit date: 02/16/2024 Discharge date: 02/17/2024  Admitted From: Home  Discharge disposition: Home   Recommendations for Outpatient Follow-Up:   Follow up with your primary care provider in one week.  Check CBC, BMP, phosphorus, magnesium  in the next visit Patient should be encouraged on remaining compliant with diet and insulin  regimen on discharge to prevent recurrent admissions to the hospital.  Discharge Diagnosis:   Principal Problem:   DKA, type 1, not at goal [facility]) Active Problems:   Abdominal pain   DKA (diabetic ketoacidosis) (HCC)   Dehydration   Leukocytosis   Diarrhea   Metabolic alkalosis   Discharge Condition: Improved.  Diet recommendation: Diabetic  Wound care: None.  Code status: Full.   History of Present Illness:   Logan Washington is a 24 y.o. male with past medical history significant of Dm1, cannabinoid hyperemesis syndrome, ADHD presented to hospital with nausea vomiting, abdominal pain and cramps with poor oral intake and diarrhea with elevated blood glucose levels for 1 day.  History of type 1 diabetes and recurrent admissions for DKA in the past.  Patient also had increasing shortness of breath.  EMS was called and patient received normal saline bolus, blood glucose levels were elevated.  Patient was then brought into the hospital.  CT scan of the abdomen and chest x-ray was negative for acute findings.  Patient was admitted hospital for DKA.   Hospital Course:   Following conditions were addressed during hospitalization as listed below,  Nausea and vomiting, Abdominal pain Likely secondary to diabetic ketoacidosis.  CT scan of the abdomen was negative for acute findings.  Has improved at this time.  Diet has been advanced and tolerated oral diet.  DKA (diabetic ketoacidosis) Received insulin  and DKA protocol.  Has been  started on diabetic diet.  Patient was transitioned  to long-acting and short acting insulin .  Hemoglobin A1c of 9.0.  Initial beta-hydroxybutyrate was 2.7.  Has improved at this time.  Patient was counseled on dietary compliance and insulin .  Hypophosphatemia.  Phosphorus of 1.8 today.  Will be replenished prior to discharge.  Hypomagnesemia.  Patient received 2 g of IV magnesium  sulfate.   Dehydration Received hydration and has improved at this time.      Leukocytosis Likely secondary to hemoconcentration.  WBC has improved to 14.7 from 20.5.  No signs of infection.   Diarrhea Improved.  Cannabis use disorder.  Urine drug screen positive for cannabis.  Counseling done.  Disposition.  At this time, patient is stable for disposition home with outpatient PCP follow-up.  Medical Consultants:   None.  Procedures:    None Subjective:   Today, patient seen and examined at bedside.  Feels much better today.  No nausea vomiting or abdominal pain.  Has tolerated oral diet.  Discharge Exam:   Vitals:   02/17/24 1515 02/17/24 1530  BP:  (!) 138/94  Pulse: 90 95  Resp:  16  Temp:  98.3 F (36.8 C)  SpO2: 100% 100%   Vitals:   02/17/24 0921 02/17/24 1100 02/17/24 1515 02/17/24 1530  BP: (!) 148/82 (!) 142/90  (!) 138/94  Pulse: 95 92 90 95  Resp: 15 16  16   Temp: 98.2 F (36.8 C)   98.3 F (36.8 C)  TempSrc: Oral   Oral  SpO2: 100% 100% 100% 100%  Weight:      Height:  Body mass index is 24.39 kg/m.  General: Alert awake, not in obvious distress HENT: pupils equally reacting to light,  No scleral pallor or icterus noted. Oral mucosa is moist.  Chest:  Clear breath sounds.  Diminished breath sounds bilaterally. No crackles or wheezes.  CVS: S1 &S2 heard. No murmur.  Regular rate and rhythm. Abdomen: Soft, nontender, nondistended.  Bowel sounds are heard.   Extremities: No cyanosis, clubbing or edema.  Peripheral pulses are palpable. Psych: Alert, awake and  oriented, normal mood CNS:  No cranial nerve deficits.  Power equal in all extremities.   Skin: Warm and dry.  No rashes noted.  The results of significant diagnostics from this hospitalization (including imaging, microbiology, ancillary and laboratory) are listed below for reference.     Diagnostic Studies:   CT ABDOMEN PELVIS W CONTRAST Result Date: 02/16/2024 EXAM: CT ABDOMEN AND PELVIS WITH CONTRAST 02/16/2024 10:03:14 PM TECHNIQUE: CT of the abdomen and pelvis was performed with the administration of 75 mL of iohexol  (OMNIPAQUE ) 350 MG/ML injection. Multiplanar reformatted images are provided for review. Automated exposure control, iterative reconstruction, and/or weight-based adjustment of the mA/kV was utilized to reduce the radiation dose to as low as reasonably achievable. COMPARISON: None available. CLINICAL HISTORY: Abdominal pain, acute, nonlocalized. FINDINGS: LOWER CHEST: No acute abnormality. LIVER: The liver is unremarkable. GALLBLADDER AND BILE DUCTS: Gallbladder is unremarkable. No biliary ductal dilatation. SPLEEN: No acute abnormality. PANCREAS: No acute abnormality. ADRENAL GLANDS: No acute abnormality. KIDNEYS, URETERS AND BLADDER: No stones in the kidneys or ureters. No hydronephrosis. No perinephric or periureteral stranding. Urinary bladder is unremarkable. GI AND BOWEL: Stomach demonstrates no acute abnormality. There is no bowel obstruction. PERITONEUM AND RETROPERITONEUM: No ascites. No free air. VASCULATURE: Aorta is normal in caliber. LYMPH NODES: No lymphadenopathy. REPRODUCTIVE ORGANS: No acute abnormality. BONES AND SOFT TISSUES: No acute osseous abnormality. No focal soft tissue abnormality. IMPRESSION: 1. No acute findings in the abdomen or pelvis. Electronically signed by: Franky Crease MD 02/16/2024 10:20 PM EST RP Workstation: [id number]   DG Chest Portable 1 View Result Date: 02/16/2024 CLINICAL DATA:  Short of breath EXAM: PORTABLE CHEST 1 VIEW COMPARISON:  03/15/2021  FINDINGS: The heart size and mediastinal contours are within normal limits. Both lungs are clear. The visualized skeletal structures are unremarkable. IMPRESSION: No active disease. Electronically Signed   By: Luke Bun M.D.   On: 02/16/2024 21:12     Labs:   Basic Metabolic Panel: Recent Labs  Lab 02/16/24 2015 02/16/24 2024 02/17/24 0024 02/17/24 0144 02/17/24 0615 02/17/24 1008  NA 134* 134*  134* 137 137 135 135  K 5.5* 5.2*  5.3* 4.0 4.9 4.7 4.1  CL 94* 101 99 101 102 102  CO2 15*  --  22  --  23 23  GLUCOSE 359* 362* 192* 193* 116* 145*  BUN 16 17 16 20 14 13   CREATININE 1.11 0.90 1.08 1.00 0.99 0.99  CALCIUM 10.1  --  9.7  --  9.4 9.0  MG  --   --  1.6*  --   --   --   PHOS  --   --  3.1  --  1.8*  --    GFR Estimated Creatinine Clearance: 117.8 mL/min (by C-G formula based on SCr of 0.99 mg/dL). Liver Function Tests: Recent Labs  Lab 02/16/24 2015 02/17/24 0615  AST 24 24  ALT 15 10  ALKPHOS 90 65  BILITOT 2.1* 1.1  PROT 8.3* 6.8  ALBUMIN 4.9 4.1  Recent Labs  Lab 02/16/24 2015  LIPASE 11   No results for input(s): AMMONIA in the last 168 hours. Coagulation profile No results for input(s): INR, PROTIME in the last 168 hours.  CBC: Recent Labs  Lab 02/16/24 2015 02/16/24 2024 02/17/24 0024 02/17/24 0144 02/17/24 0615  WBC 20.5*  --  16.2*  --  14.7*  NEUTROABS  --   --  14.9*  --   --   HGB 17.3* 17.7*  17.7* 15.9 15.6 15.0  HCT 48.7 52.0  52.0 45.2 46.0 42.2  MCV 83.5  --  85.3  --  84.1  PLT 441*  --  352  --  367   Cardiac Enzymes: Recent Labs  Lab 02/17/24 0024  CKTOTAL 104   BNP: Invalid input(s): POCBNP CBG: Recent Labs  Lab 02/17/24 0335 02/17/24 0436 02/17/24 0603 02/17/24 0733 02/17/24 1218  GLUCAP 155* 144* 140* 117* 195*   D-Dimer No results for input(s): DDIMER in the last 72 hours. Hgb A1c Recent Labs    02/17/24 0024  HGBA1C 9.0*   Lipid Profile No results for input(s): CHOL, HDL,  LDLCALC, TRIG, CHOLHDL, LDLDIRECT in the last 72 hours. Thyroid function studies No results for input(s): TSH, T4TOTAL, T3FREE, THYROIDAB in the last 72 hours.  Invalid input(s): FREET3 Anemia work up No results for input(s): VITAMINB12, FOLATE, FERRITIN, TIBC, IRON, RETICCTPCT in the last 72 hours. Microbiology No results found for this or any previous visit (from the past 240 hours).   Discharge Instructions:   Discharge Instructions     Call MD for:  persistant nausea and vomiting   Complete by: As directed    Call MD for:  severe uncontrolled pain   Complete by: As directed    Diet Carb Modified   Complete by: As directed    Discharge instructions   Complete by: As directed    Follow-up with your primary care provider in 1 week.  Seek medical attention for worsening symptoms.  Please pay attention to carbohydrates and remain on diabetic diet.  Continue to use insulin  and adjust as necessary with your primary care provider.  Please avoid smoking marijuana.   Increase activity slowly   Complete by: As directed       Allergies as of 02/17/2024       Reactions   Banana Anaphylaxis   Tilapia [fish Allergy] Shortness Of Breath, Swelling        Medication List     TAKE these medications    Baqsimi One Pack 3 MG/DOSE Powd Generic drug: Glucagon Place 3 mg into the nose.   cyclobenzaprine  10 MG tablet Commonly known as: FLEXERIL  Take 1 tablet (10 mg total) by mouth at bedtime.   Lantus  SoloStar 100 UNIT/ML Solostar Pen Generic drug: insulin  glargine Inject 28 Units into the skin at bedtime.   NovoLOG  FlexPen 100 UNIT/ML FlexPen Generic drug: insulin  aspart Inject 15-20 Units into the skin 3 (three) times daily with meals. Sliding scale , Every 7 carbs =1 unit per patient Max 30 units per day   omeprazole  20 MG capsule Commonly known as: PRILOSEC Take 20 mg by mouth 2 (two) times daily.   ondansetron  4 MG disintegrating  tablet Commonly known as: ZOFRAN -ODT Take 4 mg by mouth every 6 (six) hours as needed.        Follow-up Information     Franchot Mom, PA-C Follow up in 1 week(s).   Specialty: Family Medicine Contact information: [phone] [place]  [phone]                  Time coordinating discharge: 39 minutes  Signed:  Reigan Tolliver  [facility] 02/17/2024, 3:51 PM          "

## 2024-02-20 NOTE — Progress Notes (Signed)
" °  Subjective Patient ID: Tye Vigo is a 24 y.o. male.  Chief Complaint  Patient presents with   Occ med    C/o left thumb laceration at work     The following information was reviewed by members of the visit team:      HPI -C/o left thumb laceration at work. EE arrived at [facility] health clinic with left thumb in mouth. 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 EE states he was wearing nitrile gloves.  EE admits to having a tetanus booster last year.  Review of Systems  Objective Physical Exam - RN had EE wash his hand in clinic sink with soap and water. Bleeding re-occurred. RN used direct pressure without success. RN applied woundseal with immediate success. RN applied bandaging and provided EE with EHS number to use over the weekend in case of s/sx infection. Discussed s/sx infection and how to call EHS before going to ED/UC.  Assessment/Plan Diagnoses and all orders for this visit:  Unspecified conditions influencing health status - RN applied woundseal per standing orders, but not listed in hospital orderable section. - RN applied bandaging.  Electronically signed: Tanda JONELLE Pool, RN 02/20/2024  12:24 PM   "

## 2024-02-23 NOTE — Progress Notes (Signed)
 [facility] Demand Patient Information  Location Information: Patient State (at time of visit): [employer]  Patient Location (at time of visit):Home/Other Non-Medical  Provider Location: Home Is provider licensed to provide clinical care in the current location/state of the patient? Yes  Consent  Patient's identity was confirmed. Presenting condition or illness was discussed with the patient/personal representative. Current proposed treatment for presenting condition or illness was explained to patient/personal representative along with the likely benefits and any significant risks or complications associated with the provision of treatment by audio/video means. The patient/personal representative verbally authorized treatment to be provided by audio/video, which may include a limited review of patient's current health status, medication, or other treatment recommendations, patient education, and an opportunity to ask questions about condition and treatment. Verbal Consent Granted by Patient/Personal Representative:Yes   Visit Information: Modality: 2-Way Real-Time Audio/Video  Video Visit Start Time/ End Time:  Start time: 02/23/2024 10:28 AM EST End time: 02/23/2024 10:38 AM EST  Video Total Time: 10 minutes  History of Present Illness  Logan Washington presents to [facility]V due to concerns for nausea and diarrhea. Symptoms onset this morning. He has history of GERD and recurrent vomiting and diarrhea. He uses omeprazole  daily, but ran out and needs refill. Says this morning he woke with nausea and diarrhea. Had multiple episodes early on and did not go to work due to symptoms. Requests note for work. Says he did take ondansetron  for nausea. Has not had any active vomiting this morning. Denies bloody stools or abdominal pain. Has not had fever or chills. Tolerates po intake.     Video Exam  Physical Exam Constitutional:      General: He is not in acute distress.     Appearance: He is not ill-appearing or toxic-appearing.  HENT:     Head: Normocephalic.  Eyes:     General: No scleral icterus.    Extraocular Movements: Extraocular movements intact.  Pulmonary:     Effort: Pulmonary effort is normal. No respiratory distress.  Musculoskeletal:     Cervical back: Normal range of motion.  Skin:    Coloration: Skin is not jaundiced or pale.  Neurological:     Mental Status: He is alert and oriented to person, place, and time.  Psychiatric:        Mood and Affect: Mood normal.        Behavior: Behavior normal.        Thought Content: Thought content normal.     Diagnosis, Medical Decision Making & Disposition  Assessment/ Plan 1. Diarrhea, unspecified type (Primary)  2. Gastroesophageal reflux disease without esophagitis - omeprazole  (PriLOSEC) 40 mg DR capsule; Take 1 capsule (40 mg total) by mouth every morning. About 30 minutes before breakfast, with full glass of water.  Dispense: 14 capsule; Refill: 0  3. Nausea    Patient reporting recurrence of chronic intermittent diarrhea and GERD. Differentials including but not liimite to acute viral gastroenteritis. Work note provided for today. Omeprazole  refilled 40 mg for 2 weeks. He is to follow up with PCP for further refills and re-evaluation of chronic GI symptoms.  Does not need refill of Zofran  and can continue as needed for nausea. Advised not to take antidiarrheal as it is better to flush everything out. If medication for diarrhea is absolutely needed for short term relief, Imodium OTC is recommended. Can start OTC probiotics as well.   Here are some things you can try at home:    1. Rest and stay  well hydrated - drink plenty of water, Gatorade/Powerade, clear liquids.   2. Once tolerating liquids with no problems, you may advance to a bland diet such as bananas, rice, apple sauce, toast, crackers, etc.   3. Tylenol  or Advil as needed for low grade fever or headache.    Monitor your  symptoms which we expect will improve in the next 1-2 days. If you do not improve after 2-3 days, you will need to see your doctor or go to Urgent Care.    Please seek prompt attention for worsening symptoms: fever 102.5 or higher, increasing headache, stiff neck, confusion, severe abdominal pain, or signs of dehydration.  For questions or concerns regarding this visit, patient should contact Virtual Support at [phone].  Disposition:  Patient to continue care at home.  Electronically signed: Sherrell Bough, PA-C 02/23/2024  10:42 AM

## 2024-02-25 NOTE — Progress Notes (Signed)
" °  Subjective Patient ID: Logan Washington is a 24 y.o. male.  Chief Complaint  Patient presents with   Nurse Visit    C/o lower back pain    The following information was reviewed by members of the visit team:  Allergies  Meds      HPI - C/o lower back pain. Denies any injury. States it happens every morning as he wakes up.  Review of Systems - afebrile, VSS, eyes PERRLA, AAO x3.  Objective Physical Exam - Mild muscle tension noted in bilat lumbar mid region. No muscle spasms. No pain to palpation. Pt denies heat or ice. Requests medication.  Assessment/Plan Diagnoses and all orders for this visit:  Unspecified conditions influencing health status - Referred to [facility] for stretch therapy/education, possible pulsed massage if appropriate. - Post [facility] assessment, provided icy/hot therapy patch (not in hospital orderable section but per standing orders.) - Discussed bedding. Pt admits to moving into a new house 2 weeks ago and has not slept in proper bedding since. Pt admits to needing to buy a bed/mattress and stop sleeping on his sofa.  Electronically signed: Tanda JONELLE Pool, RN 02/25/2024  11:06 AM   "

## 2024-02-25 NOTE — Progress Notes (Signed)
" °  Subjective Patient ID: Jontay Maston is a 24 y.o. male.  Chief Complaint  Patient presents with   Occ med    F/u to 02/20/24 WR injury     The following information was reviewed by members of the visit team:      HPI - F/u to 02/20/24 WR injury  Review of Systems- afebrile, VSS, AAO x3, eyes PERRLA.  Objective Physical Exam - Left thumb laceration scabbed over and in various stages of reconstruction. Full AROM. No s/sx infection. No pain to palpation.  Assessment/Plan Diagnoses and all orders for this visit:  Unspecified conditions influencing health status -Keep scab clean -Monitor Bg levels and keep within prescribed limits. Educated Pt that hypergycemia events will elevate threat of infection.  Electronically signed: Tanda JONELLE Pool, RN 02/25/2024  11:14 AM   "

## 2024-02-27 ENCOUNTER — Other Ambulatory Visit: Payer: Self-pay

## 2024-02-27 ENCOUNTER — Observation Stay (HOSPITAL_COMMUNITY)
Admission: EM | Admit: 2024-02-27 | Discharge: 2024-02-28 | Disposition: A | Discharge status: Home / Self Care | Attending: Internal Medicine | Admitting: Internal Medicine

## 2024-02-27 DIAGNOSIS — F1290 Cannabis use, unspecified, uncomplicated: Secondary | ICD-10-CM | POA: Diagnosis not present

## 2024-02-27 DIAGNOSIS — N179 Acute kidney failure, unspecified: Secondary | ICD-10-CM | POA: Diagnosis not present

## 2024-02-27 DIAGNOSIS — J101 Influenza due to other identified influenza virus with other respiratory manifestations: Secondary | ICD-10-CM | POA: Diagnosis not present

## 2024-02-27 DIAGNOSIS — R109 Unspecified abdominal pain: Secondary | ICD-10-CM | POA: Diagnosis present

## 2024-02-27 DIAGNOSIS — M549 Dorsalgia, unspecified: Secondary | ICD-10-CM | POA: Insufficient documentation

## 2024-02-27 DIAGNOSIS — G8929 Other chronic pain: Secondary | ICD-10-CM

## 2024-02-27 DIAGNOSIS — F1990 Other psychoactive substance use, unspecified, uncomplicated: Secondary | ICD-10-CM

## 2024-02-27 DIAGNOSIS — F17210 Nicotine dependence, cigarettes, uncomplicated: Secondary | ICD-10-CM | POA: Diagnosis not present

## 2024-02-27 DIAGNOSIS — R112 Nausea with vomiting, unspecified: Secondary | ICD-10-CM | POA: Diagnosis not present

## 2024-02-27 DIAGNOSIS — Z794 Long term (current) use of insulin: Secondary | ICD-10-CM | POA: Insufficient documentation

## 2024-02-27 DIAGNOSIS — E1010 Type 1 diabetes mellitus with ketoacidosis without coma: Secondary | ICD-10-CM | POA: Diagnosis not present

## 2024-02-27 DIAGNOSIS — E875 Hyperkalemia: Secondary | ICD-10-CM

## 2024-02-27 DIAGNOSIS — E1110 Type 2 diabetes mellitus with ketoacidosis without coma: Secondary | ICD-10-CM | POA: Diagnosis present

## 2024-02-27 LAB — CBC WITH DIFFERENTIAL/PLATELET
Abs Immature Granulocytes: 0.09 K/uL — ABNORMAL HIGH (ref 0.00–0.07)
Basophils Absolute: 0 K/uL (ref 0.0–0.1)
Basophils Relative: 0 %
Eosinophils Absolute: 0 K/uL (ref 0.0–0.5)
Eosinophils Relative: 0 %
HCT: 46 % (ref 39.0–52.0)
Hemoglobin: 15.2 g/dL (ref 13.0–17.0)
Immature Granulocytes: 1 %
Lymphocytes Relative: 9 %
Lymphs Abs: 0.7 K/uL (ref 0.7–4.0)
MCH: 29.2 pg (ref 26.0–34.0)
MCHC: 33 g/dL (ref 30.0–36.0)
MCV: 88.3 fL (ref 80.0–100.0)
Monocytes Absolute: 0.3 K/uL (ref 0.1–1.0)
Monocytes Relative: 4 %
Neutro Abs: 6.7 K/uL (ref 1.7–7.7)
Neutrophils Relative %: 86 %
Platelets: 310 K/uL (ref 150–400)
RBC: 5.21 MIL/uL (ref 4.22–5.81)
RDW: 12.3 % (ref 11.5–15.5)
WBC: 7.8 K/uL (ref 4.0–10.5)
nRBC: 0 % (ref 0.0–0.2)

## 2024-02-27 LAB — URINALYSIS, ROUTINE W REFLEX MICROSCOPIC
Bacteria, UA: NONE SEEN
Bilirubin Urine: NEGATIVE
Glucose, UA: >=500 mg/dL — AB
Hgb urine dipstick: NEGATIVE
Ketones, ur: 80 mg/dL — AB
Leukocytes,Ua: NEGATIVE
Nitrite: NEGATIVE
Protein, ur: NEGATIVE mg/dL
Specific Gravity, Urine: 1.023 (ref 1.005–1.030)
pH: 5 (ref 5.0–8.0)

## 2024-02-27 LAB — URINE DRUG SCREEN
Amphetamines: NEGATIVE
Barbiturates: NEGATIVE
Benzodiazepines: NEGATIVE
Cocaine: NEGATIVE
Fentanyl: NEGATIVE
Methadone Scn, Ur: NEGATIVE
Opiates: NEGATIVE
Tetrahydrocannabinol: POSITIVE — AB

## 2024-02-27 LAB — CBG MONITORING, ED
Glucose-Capillary: 396 mg/dL — ABNORMAL HIGH (ref 70–99)
Glucose-Capillary: 369 mg/dL — ABNORMAL HIGH (ref 70–99)
Glucose-Capillary: 318 mg/dL — ABNORMAL HIGH (ref 70–99)

## 2024-02-27 LAB — GLUCOSE, CAPILLARY
Glucose-Capillary: 220 mg/dL — ABNORMAL HIGH (ref 70–99)
Glucose-Capillary: 248 mg/dL — ABNORMAL HIGH (ref 70–99)
Glucose-Capillary: 212 mg/dL — ABNORMAL HIGH (ref 70–99)
Glucose-Capillary: 230 mg/dL — ABNORMAL HIGH (ref 70–99)
Glucose-Capillary: 187 mg/dL — ABNORMAL HIGH (ref 70–99)
Glucose-Capillary: 169 mg/dL — ABNORMAL HIGH (ref 70–99)
Glucose-Capillary: 176 mg/dL — ABNORMAL HIGH (ref 70–99)
Glucose-Capillary: 176 mg/dL — ABNORMAL HIGH (ref 70–99)
Glucose-Capillary: 149 mg/dL — ABNORMAL HIGH (ref 70–99)
Glucose-Capillary: 146 mg/dL — ABNORMAL HIGH (ref 70–99)
Glucose-Capillary: 133 mg/dL — ABNORMAL HIGH (ref 70–99)
Glucose-Capillary: 142 mg/dL — ABNORMAL HIGH (ref 70–99)

## 2024-02-27 LAB — BASIC METABOLIC PANEL WITH GFR
Anion gap: 23 — ABNORMAL HIGH (ref 5–15)
Anion gap: 13 (ref 5–15)
Anion gap: 11 (ref 5–15)
Anion gap: 10 (ref 5–15)
BUN: 25 mg/dL — ABNORMAL HIGH (ref 6–20)
BUN: 21 mg/dL — ABNORMAL HIGH (ref 6–20)
BUN: 19 mg/dL (ref 6–20)
BUN: 17 mg/dL (ref 6–20)
CO2: 11 mmol/L — ABNORMAL LOW (ref 22–32)
CO2: 19 mmol/L — ABNORMAL LOW (ref 22–32)
CO2: 20 mmol/L — ABNORMAL LOW (ref 22–32)
CO2: 21 mmol/L — ABNORMAL LOW (ref 22–32)
Calcium: 9.4 mg/dL (ref 8.9–10.3)
Calcium: 9.8 mg/dL (ref 8.9–10.3)
Calcium: 9.5 mg/dL (ref 8.9–10.3)
Calcium: 9.6 mg/dL (ref 8.9–10.3)
Chloride: 102 mmol/L (ref 98–111)
Chloride: 103 mmol/L (ref 98–111)
Chloride: 103 mmol/L (ref 98–111)
Chloride: 104 mmol/L (ref 98–111)
Creatinine, Ser: 1.17 mg/dL (ref 0.61–1.24)
Creatinine, Ser: 1.13 mg/dL (ref 0.61–1.24)
Creatinine, Ser: 1.05 mg/dL (ref 0.61–1.24)
Creatinine, Ser: 1.06 mg/dL (ref 0.61–1.24)
GFR, Estimated: >60 mL/min
GFR, Estimated: >60 mL/min
GFR, Estimated: >60 mL/min
GFR, Estimated: >60 mL/min
Glucose, Bld: 264 mg/dL — ABNORMAL HIGH (ref 70–99)
Glucose, Bld: 196 mg/dL — ABNORMAL HIGH (ref 70–99)
Glucose, Bld: 151 mg/dL — ABNORMAL HIGH (ref 70–99)
Glucose, Bld: 134 mg/dL — ABNORMAL HIGH (ref 70–99)
Potassium: 4.4 mmol/L (ref 3.5–5.1)
Potassium: 4.7 mmol/L (ref 3.5–5.1)
Potassium: 4.3 mmol/L (ref 3.5–5.1)
Potassium: 4 mmol/L (ref 3.5–5.1)
Sodium: 136 mmol/L (ref 135–145)
Sodium: 136 mmol/L (ref 135–145)
Sodium: 135 mmol/L (ref 135–145)
Sodium: 136 mmol/L (ref 135–145)

## 2024-02-27 LAB — COMPREHENSIVE METABOLIC PANEL WITH GFR
ALT: 18 U/L (ref 0–44)
AST: 28 U/L (ref 15–41)
Albumin: 4.6 g/dL (ref 3.5–5.0)
Alkaline Phosphatase: 91 U/L (ref 38–126)
Anion gap: 27 — ABNORMAL HIGH (ref 5–15)
BUN: 23 mg/dL — ABNORMAL HIGH (ref 6–20)
CO2: 12 mmol/L — ABNORMAL LOW (ref 22–32)
Calcium: 9.7 mg/dL (ref 8.9–10.3)
Chloride: 92 mmol/L — ABNORMAL LOW (ref 98–111)
Creatinine, Ser: 1.28 mg/dL — ABNORMAL HIGH (ref 0.61–1.24)
GFR, Estimated: >60 mL/min
Glucose, Bld: 427 mg/dL — ABNORMAL HIGH (ref 70–99)
Potassium: 6.1 mmol/L — ABNORMAL HIGH (ref 3.5–5.1)
Sodium: 132 mmol/L — ABNORMAL LOW (ref 135–145)
Total Bilirubin: 1 mg/dL (ref 0.0–1.2)
Total Protein: 7.9 g/dL (ref 6.5–8.1)

## 2024-02-27 LAB — BLOOD GAS, VENOUS
Acid-base deficit: 13.1 mmol/L — ABNORMAL HIGH (ref 0.0–2.0)
Bicarbonate: 14.1 mmol/L — ABNORMAL LOW (ref 20.0–28.0)
O2 Saturation: 69.1 %
Patient temperature: 37
pCO2, Ven: 36 mmHg — ABNORMAL LOW (ref 44–60)
pH, Ven: 7.2 — ABNORMAL LOW (ref 7.25–7.43)
pO2, Ven: 48 mmHg — ABNORMAL HIGH (ref 32–45)

## 2024-02-27 LAB — RESP PANEL BY RT-PCR (RSV, FLU A&B, COVID)  RVPGX2
Influenza A by PCR: POSITIVE — AB
Influenza B by PCR: NEGATIVE
Resp Syncytial Virus by PCR: NEGATIVE
SARS Coronavirus 2 by RT PCR: NEGATIVE

## 2024-02-27 LAB — BETA-HYDROXYBUTYRIC ACID
Beta-Hydroxybutyric Acid: 6.44 mmol/L — ABNORMAL HIGH (ref 0.05–0.27)
Beta-Hydroxybutyric Acid: 3.1 mmol/L — ABNORMAL HIGH (ref 0.05–0.27)

## 2024-02-27 LAB — ETHANOL: Alcohol, Ethyl (B): <15 mg/dL

## 2024-02-27 MED ORDER — LACTATED RINGERS IV BOLUS: 500.0000 mL | Freq: Once | INTRAVENOUS | Status: DC

## 2024-02-27 MED ORDER — SODIUM CHLORIDE 0.9 % IV BOLUS
1000.0000 mL | Freq: Once | INTRAVENOUS | Status: AC
  Administered 2024-02-27: 1000 mL via INTRAVENOUS

## 2024-02-27 MED ORDER — ACETAMINOPHEN 650 MG RE SUPP: 650.0000 mg | Freq: Four times a day (QID) | RECTAL | Status: DC | PRN

## 2024-02-27 MED ORDER — ORAL CARE MOUTH RINSE: 15.0000 mL | OROMUCOSAL | Status: DC | PRN

## 2024-02-27 MED ORDER — ONDANSETRON HCL 4 MG/2ML IJ SOLN
4.0000 mg | Freq: Once | INTRAMUSCULAR | Status: AC
  Administered 2024-02-27: 4 mg via INTRAVENOUS
  Filled 2024-02-27: qty 2

## 2024-02-27 MED ORDER — ENOXAPARIN SODIUM 40 MG/0.4ML IJ SOSY
40.0000 mg | PREFILLED_SYRINGE | INTRAMUSCULAR | Status: DC
  Filled 2024-02-27: qty 0.4

## 2024-02-27 MED ORDER — SODIUM CHLORIDE 0.9 % IV SOLN
12.5000 mg | Freq: Once | INTRAVENOUS | Status: AC
  Administered 2024-02-27: 12.5 mg via INTRAVENOUS
  Filled 2024-02-27: qty 12.5

## 2024-02-27 MED ORDER — ONDANSETRON HCL 4 MG PO TABS: 4.0000 mg | ORAL_TABLET | Freq: Four times a day (QID) | ORAL | Status: DC | PRN

## 2024-02-27 MED ORDER — LACTATED RINGERS IV BOLUS
1000.0000 mL | Freq: Once | INTRAVENOUS | Status: AC
  Administered 2024-02-27: 1000 mL via INTRAVENOUS

## 2024-02-27 MED ORDER — SODIUM CHLORIDE 0.9 % IV BOLUS: 1000.0000 mL | Freq: Once | INTRAVENOUS | Status: DC

## 2024-02-27 MED ORDER — ACETAMINOPHEN 325 MG PO TABS
650.0000 mg | ORAL_TABLET | Freq: Four times a day (QID) | ORAL | Status: DC | PRN
  Administered 2024-02-27: 650 mg via ORAL
  Filled 2024-02-27: qty 2

## 2024-02-27 MED ORDER — DEXTROSE IN LACTATED RINGERS 5 % IV SOLN: INTRAVENOUS | Status: AC

## 2024-02-27 MED ORDER — CALCIUM GLUCONATE-NACL 1-0.675 GM/50ML-% IV SOLN
1.0000 g | Freq: Once | INTRAVENOUS | Status: AC
  Administered 2024-02-27: 1000 mg via INTRAVENOUS
  Filled 2024-02-27: qty 50

## 2024-02-27 MED ORDER — DEXTROSE 50 % IV SOLN: 0.0000 mL | INTRAVENOUS | Status: DC | PRN

## 2024-02-27 MED ORDER — ONDANSETRON HCL 4 MG/2ML IJ SOLN: 4.0000 mg | Freq: Four times a day (QID) | INTRAMUSCULAR | Status: DC | PRN

## 2024-02-27 MED ORDER — INSULIN REGULAR(HUMAN) IN NACL 100-0.9 UT/100ML-% IV SOLN
INTRAVENOUS | Status: DC
  Administered 2024-02-27: 11 [IU]/h via INTRAVENOUS
  Filled 2024-02-27: qty 100

## 2024-02-27 MED ORDER — SENNOSIDES-DOCUSATE SODIUM 8.6-50 MG PO TABS: 1.0000 | ORAL_TABLET | Freq: Every evening | ORAL | Status: DC | PRN

## 2024-02-27 MED ORDER — CHLORHEXIDINE GLUCONATE CLOTH 2 % EX PADS
6.0000 | MEDICATED_PAD | Freq: Every day | CUTANEOUS | Status: DC
  Administered 2024-02-27: 6 via TOPICAL

## 2024-02-27 MED ORDER — LACTATED RINGERS IV SOLN: INTRAVENOUS | Status: AC

## 2024-02-27 MED ORDER — SODIUM BICARBONATE 8.4 % IV SOLN
50.0000 meq | Freq: Once | INTRAVENOUS | Status: DC
  Filled 2024-02-27: qty 50

## 2024-02-27 MED ORDER — BISACODYL 5 MG PO TBEC: 5.0000 mg | DELAYED_RELEASE_TABLET | Freq: Every day | ORAL | Status: DC | PRN

## 2024-02-27 NOTE — Inpatient Diabetes Management (Signed)
 Inpatient Diabetes Program Recommendations  AACE/ADA: New Consensus Statement on Inpatient Glycemic Control (2015)  Target Ranges:  Prepandial:   less than 140 mg/dL      Peak postprandial:   less than 180 mg/dL (1-2 hours)      Critically ill patients:  140 - 180 mg/dL   Lab Results  Component Value Date   GLUCAP 230 (H) 02/27/2024   HGBA1C 9.0 (H) 02/17/2024    Review of Glycemic Control  Diabetes history: DM1 Outpatient Diabetes medications: Lantus  28 at bedtime, Novolog  15-20 TID 1:7 CHO Current orders for Inpatient glycemic control: IV insulin  per [facility] for DKA  HgbA1C - 9% Need BHB - last one 6.44 at 0528 Covid 19+  Inpatient Diabetes Program Recommendations:    Spoke with pt at bedside regarding his diabetes and 2nd admission for DKA in past 2 weeks. Pt states he has a lot of stress and thinks this is cause for DKA. Did not miss Lantus  dose, pt hasn't been eating, and said he wasn't taking his Novolog . Has Dexcom prescription, but needs to go to pharmacy to pick it up. Has no problems in getting insulins or supplies. Poor appetite recently, although pt states he's hungry now. Discussed HgbA1C of 9% and importance getting blood sugars in good control (HgbA1C < 7%) Denies hypoglycemia. Discussed s/s and treatment.  When transitioning to basal-bolus insulin , give Lantus  1-2H prior to discontinuation of drip.   Lantus  15 units Q24H  Novolog  0-9 TID with meals and 0-5 HS  Novolog  6 units TID with meals if eating > 50%  Continue to follow.  Thank you. Shona Brandy, RD, LDN, CDCES Inpatient Diabetes Coordinator [phone]

## 2024-02-27 NOTE — ED Triage Notes (Signed)
 BIBA from home, over the last 2 days has become more lethargic with increasing abdominal pain and N/V.  He was in DKA last week, Hx DM1.  Pt states he is compliant with meds, uncle says he isn't.  Took 28 units of lantis around midnight.  416 CBG  Given 750 cc NS and 4 mg Zofran  20g L forearm  150/76 Hr 90 100% RA

## 2024-02-27 NOTE — ED Provider Notes (Signed)
 " [place] EMERGENCY DEPARTMENT AT [facility] Provider Note   CSN: [id number] Arrival date & time: 02/27/24  9486     Patient presents with: Abdominal Pain   Logan Washington is a 24 y.o. male.   Patient with T1DM, recent admission for DKA, presents from home by EMS reporting increasing lethargy x 2-3 days, nausea, vomiting. The patient reports he is using his insulin  as prescribed. He denies alcohol or drug use. No fever. He denies pain. No diarrhea.   The history is provided by the patient and the EMS personnel. No language interpreter was used.  Abdominal Pain      Prior to Admission medications  Medication Sig Start Date End Date Taking? Authorizing Provider  BAQSIMI ONE PACK 3 MG/DOSE POWD Place 3 mg into the nose. 12/16/23   [provider]  cyclobenzaprine  (FLEXERIL ) 10 MG tablet Take 1 tablet (10 mg total) by mouth at bedtime. 11/18/23   Gladis Elsie BROCKS, PA-C  Insulin  Pen Needle 32G X 8 MM MISC Use as directed 02/17/24   Pokhrel, Vernal, MD  LANTUS  SOLOSTAR 100 UNIT/ML Solostar Pen Inject 28 Units into the skin at bedtime. 02/17/24 05/17/24  Pokhrel, Vernal, MD  NOVOLOG  FLEXPEN 100 UNIT/ML FlexPen Inject 15-20 Units into the skin 3 (three) times daily with meals. Sliding scale , Every 7 carbs =1 unit per patient Max 30 units per day 02/17/24   Pokhrel, Laxman, MD  omeprazole  (PRILOSEC) 20 MG capsule Take 20 mg by mouth 2 (two) times daily. 12/01/23   [provider]  ondansetron  (ZOFRAN -ODT) 4 MG disintegrating tablet Take 4 mg by mouth every 6 (six) hours as needed. 11/27/23   [provider]    Allergies: Banana and Tilapia [fish allergy]    Review of Systems  Gastrointestinal:  Positive for abdominal pain.    Updated Vital Signs BP 128/86   Pulse 88   Temp 98.2 F (36.8 C) (Oral)   Resp 18   Ht 5' 10 (1.778 m)   Wt 77.1 kg   SpO2 99%   BMI 24.39 kg/m   Physical Exam Vitals and nursing note reviewed.  Constitutional:       Appearance: He is ill-appearing.     Comments: Delayed response, lethargic.  HENT:     Head: Normocephalic.  Cardiovascular:     Rate and Rhythm: Normal rate.     Heart sounds: No murmur heard. Pulmonary:     Effort: Pulmonary effort is normal.     Breath sounds: No wheezing, rhonchi or rales.  Abdominal:     General: There is no distension.  Neurological:     GCS: GCS eye subscore is 4. GCS verbal subscore is 4. GCS motor subscore is 5.     Cranial Nerves: No facial asymmetry.     (all labs ordered are listed, but only abnormal results are displayed) Labs Reviewed  CBC WITH DIFFERENTIAL/PLATELET - Abnormal; Notable for the following components:      Result Value   Abs Immature Granulocytes 0.09 (*)    All other components within normal limits  BLOOD GAS, VENOUS - Abnormal; Notable for the following components:   pH, Ven 7.2 (*)    pCO2, Ven 36 (*)    pO2, Ven 48 (*)    Bicarbonate 14.1 (*)    Acid-base deficit 13.1 (*)    All other components within normal limits  CBG MONITORING, ED - Abnormal; Notable for the following components:   Glucose-Capillary 396 (*)  All other components within normal limits  COMPREHENSIVE METABOLIC PANEL WITH GFR  BETA-HYDROXYBUTYRIC ACID  URINE DRUG SCREEN  ETHANOL  URINALYSIS, ROUTINE W REFLEX MICROSCOPIC    EKG: None  Radiology: No results found.   .Critical Care  Performed by: Odell Balls, PA-C Authorized by: Odell Balls, PA-C   Critical care provider statement:    Critical care time (minutes):  40   Critical care was necessary to treat or prevent imminent or life-threatening deterioration of the following conditions:  Endocrine crisis   Critical care was time spent personally by me on the following activities:  Evaluation of patient's response to treatment, examination of patient, interpretation of cardiac output measurements, ordering and review of laboratory studies, ordering and review of radiographic studies, pulse  oximetry, re-evaluation of patient's condition and review of old charts    Medications Ordered in the ED  sodium chloride  0.9 % bolus 1,000 mL (1,000 mLs Intravenous New Bag/Given 02/27/24 0529)    Clinical Course as of 02/27/24 0627  Fri Feb 27, 2024  0532 Patient with T1DM here with lethargy, vomiting. Reports compliance with insulin . Per EMS, CBG 416 on their arrival. Has received 750 cc IV fluids. Patient is slow to respond, lethargic, follows command, answers appropriately. 2nd IV ordered, additional fluid bolus, labs pending.  [SU]  0607 VG obtained showing pH 7.2, bicarb 14, c/w moderate DKA.  [SU]  [id number] Patient care signed out to oncoming provider. Anticipate admission for treatment DKA. Insulin  infusion ordered. Patient stable.  [SU]    Clinical Course User Index [SU] Odell Balls, PA-C                                 Medical Decision Making Amount and/or Complexity of Data Reviewed Labs: ordered.        Final diagnoses:  Type 1 diabetes mellitus with ketoacidosis without coma [facility])    ED Discharge Orders     None          Odell Balls, PA-C 02/27/24 9372    Theadore Ozell HERO, MD 02/27/24 [phone]  "

## 2024-02-27 NOTE — ED Notes (Signed)
 Called Report to receiving RN on ICU 1224 - Debra, RN

## 2024-02-27 NOTE — H&P (Addendum)
 " History and Physical  Logan Washington FMW:[id number] DOB: 12/11/2000 DOA: 02/27/2024  PCP: Franchot Mom, PA-C   Chief Complaint: Lethargy, abdominal pain, N/V  HPI: Logan Washington is a 24 y.o. male with medical history significant for type 1 diabetes, DKA, ADHD, THC abuse, and cannabis hyperemesis who presents to the ED for evaluation of increased lethargy, abdominal pain, nausea and vomiting. Patient reports that since Tuesday, he has had increased lethargy and weakness with nausea, vomiting and abdominal discomfort. Patient admits to taking his insulin  but not on a consistent basis.  He denies any recent illnesses  ED Course: Initial vitals show patient afebrile and normotensive. Initial labs significant for sodium 132, K+ 6.1 (hemolyzed sample), bicarb 12, glucose 427, creatinine 1.28, anion gap 27, VBG with pH 7.2, pCO2 36, pO2 48 and bicarb 14.1, unremarkable CBC, UA positive for THC, normal ethanol levels, UA shows significant glucosuria and moderate ketonuria but no signs of infection. EKG shows sinus rhythm with peaked T waves. Pt received 1 g of IV calcium  gluconate, 1 amp of sodium bicarb IVF boluses and started on insulin  drip. TRH was consulted for admission.   Review of Systems: Please see HPI for pertinent positives and negatives. A complete 10 system review of systems are otherwise negative.  Past Medical History:  Diagnosis Date   ADHD (attention deficit hyperactivity disorder)    Diabetes (HCC)    Diabetes mellitus without complication (HCC)    Past Surgical History:  Procedure Laterality Date   APPENDECTOMY     ESOPHAGOGASTRODUODENOSCOPY (EGD) WITH PROPOFOL  N/A 03/18/2021   Procedure: ESOPHAGOGASTRODUODENOSCOPY (EGD) WITH PROPOFOL ;  Surgeon: Rosalie Kitchens, MD;  Location: [facility] ENDOSCOPY;  Service: Endoscopy;  Laterality: N/A;   LAPAROSCOPIC APPENDECTOMY N/A 04/08/2023   Procedure: APPENDECTOMY LAPAROSCOPIC;  Surgeon: Belinda Cough, MD;  Location: [facility] OR;  Service: General;   Laterality: N/A;   TONSILLECTOMY     Social History:  reports that he has been smoking cigarettes. He has never used smokeless tobacco. He reports current drug use. Drug: Marijuana. He reports that he does not drink alcohol.  Allergies[1]  Family History  Problem Relation Age of Onset   Breast cancer Mother    Lung cancer Mother    Colon cancer Maternal Grandmother    Sudden death Neg Hx    Heart attack Neg Hx    Stomach cancer Neg Hx    Pancreatic cancer Neg Hx    Esophageal cancer Neg Hx    Rectal cancer Neg Hx      Prior to Admission medications  Medication Sig Start Date End Date Taking? Authorizing Provider  BAQSIMI ONE PACK 3 MG/DOSE POWD Place 3 mg into the nose. 12/16/23   [provider]  cyclobenzaprine  (FLEXERIL ) 10 MG tablet Take 1 tablet (10 mg total) by mouth at bedtime. 11/18/23   Gladis Elsie BROCKS, PA-C  Insulin  Pen Needle 32G X 8 MM MISC Use as directed 02/17/24   Pokhrel, Vernal, MD  LANTUS  SOLOSTAR 100 UNIT/ML Solostar Pen Inject 28 Units into the skin at bedtime. 02/17/24 05/17/24  Pokhrel, Vernal, MD  NOVOLOG  FLEXPEN 100 UNIT/ML FlexPen Inject 15-20 Units into the skin 3 (three) times daily with meals. Sliding scale , Every 7 carbs =1 unit per patient Max 30 units per day 02/17/24   Pokhrel, Laxman, MD  omeprazole  (PRILOSEC) 20 MG capsule Take 20 mg by mouth 2 (two) times daily. 12/01/23   [provider]  ondansetron  (ZOFRAN -ODT) 4 MG disintegrating tablet Take 4 mg by mouth every 6 (six)  hours as needed. 11/27/23   [provider]    Physical Exam: BP 128/86   Pulse 88   Temp 98.2 F (36.8 C) (Oral)   Resp 18   Ht 5' 10 (1.778 m)   Wt 77.1 kg   SpO2 98%   BMI 24.39 kg/m  General: Lethargic appearing young man laying in bed. No acute distress. HEENT: Logan Washington. Anicteric sclera. Cracked lips. Dry mucous membrane. CV: RRR. No murmurs, rubs, or gallops. No LE edema Pulmonary: Lungs CTAB. Normal effort. No wheezing or rales. Abdominal:  Soft, nontender, nondistended. Normal bowel sounds. Extremities: Palpable radial and DP pulses. Normal ROM. Skin: Warm and dry. No obvious rash or lesions. Neuro: Lethargic but oriented x 3. Moves all extremities. Normal sensation to light touch. No focal deficit.          Labs on Admission:  Basic Metabolic Panel: Recent Labs  Lab 02/27/24 0527  NA 132*  K 6.1*  CL 92*  CO2 12*  GLUCOSE 427*  BUN 23*  CREATININE 1.28*  CALCIUM  9.7   Liver Function Tests: Recent Labs  Lab 02/27/24 0527  AST 28  ALT 18  ALKPHOS 91  BILITOT 1.0  PROT 7.9  ALBUMIN 4.6   No results for input(s): LIPASE, AMYLASE in the last 168 hours. No results for input(s): AMMONIA in the last 168 hours. CBC: Recent Labs  Lab 02/27/24 0527  WBC 7.8  NEUTROABS 6.7  HGB 15.2  HCT 46.0  MCV 88.3  PLT 310   Cardiac Enzymes: No results for input(s): CKTOTAL, CKMB, CKMBINDEX, TROPONINI in the last 168 hours. BNP (last 3 results) No results for input(s): BNP in the last 8760 hours.  ProBNP (last 3 results) No results for input(s): PROBNP in the last 8760 hours.  CBG: Recent Labs  Lab 02/27/24 0533 02/27/24 0802  GLUCAP 396* 369*    Radiological Exams on Admission: No results found. Assessment/Plan Logan Washington is a 24 y.o. male with medical history significant for type 1 diabetes, DKA, ADHD, THC abuse, and cannabis hyperemesis who presents to the ED for evaluation of increased lethargy, abdominal pain, nausea and vomiting and admitted for diabetic ketoacidosis  # DKA - Patient with uncontrolled T1DM (A1c 9.0% earlier this month) presented with 3 days of increased lethargy, nausea and vomiting - Found to have elevated blood sugar in the 400s, AGMA and ketonuria/ketonemia - This is a second DKA in less than 2 weeks due to medication noncompliance - S/p LR bolus and initiation of insulin  drip in ED - Pt acutely ill, severely dehydrated and lethargic-appearing but remains  oriented at baseline - Continue insulin  drip - LR @ 125 mL/hr until CBG less than 250 - Switch to D5-LR when 1 CBG less than 250 - Keep NPO except sips with ice chips/meds - BMP Q4H, CBG Q1H, BHB Q8H - Once anion gap closed 2, start CM diet and if able to eat, administer Semglee  28 units - Continue insulin  drip for 1-2 more hours, then discontinue and start SSI-S  - DC fluids if eating, drinking, and off insulin  drip  # Hyperkalemia - K+ elevated to 6.1 (hemolyzed sample) however there is an evidence of peaked T waves on EKG - Agree with IV calcium  gluconate - Continue insulin  drip and follow-up Q4H BMPs  # AKI - Creatinine elevated to 1.28, from normal baseline - Likely secondary to severe dehydration in the setting of DKA, nausea and vomiting - IV hydration as above - Trend renal function and avoid nephrotoxic  meds  # Nausea and vomiting - Likely combination of DKA and cannabis hyperemesis - IV Zofran  as needed for nausea and vomiting  # Substance use disorder - Endorses ongoing THC use, reports last use was 2 weeks ago however UDS positive for THC - Continue substance use counseling  Addendum 1412: Influenza A infection - At admission, patient reported to RN he has been around his son who has a cold, likely the flu. Respiratory panel was checked and patient was found to be influenza A positive. - Acute viral infection likely the catalyst for his DKA - Continue symptomatic management - Droplet precautions  DVT prophylaxis: Lovenox      Code Status: Full Code  Consults called: None  Family Communication: No family at bedside  Severity of Illness: The appropriate patient status for this patient is OBSERVATION. Observation status is judged to be reasonable and necessary in order to provide the required intensity of service to ensure the patient's safety. The patient's presenting symptoms, physical exam findings, and initial radiographic and laboratory data in the context  of their medical condition is felt to place them at decreased risk for further clinical deterioration. Furthermore, it is anticipated that the patient will be medically stable for discharge from the hospital within 2 midnights of admission.   Level of care: Logan Lou Claretta CHRISTELLA, MD 02/27/2024, 8:51 AM [facility] Pager: [phone] Isaiah 41:10   If 7PM-7AM, please contact night-coverage [email or web address] Password [email or web address]     [1]  Allergies Allergen Reactions   Banana Anaphylaxis   Tilapia [Fish Allergy] Shortness Of Breath and Swelling   "

## 2024-02-27 NOTE — Plan of Care (Signed)
  Problem: Pain Managment: Goal: General experience of comfort will improve and/or be controlled Outcome: Progressing   Problem: Safety: Goal: Ability to remain free from injury will improve Outcome: Progressing   Problem: Skin Integrity: Goal: Risk for impaired skin integrity will decrease Outcome: Progressing

## 2024-02-27 NOTE — ED Provider Notes (Signed)
 Handoff from [employer] - see their note for further HPI  Given current laboratory values and presentation - DKA most likely  To admit once all laboratory values result  Patient currently getting insulin  infusion  Physical Exam  BP 128/86   Pulse 88   Temp 98.2 F (36.8 C) (Oral)   Resp 18   Ht 5' 10 (1.778 m)   Wt 77.1 kg   SpO2 98%   BMI 24.39 kg/m   Physical Exam  Procedures  Procedures  ED Course / MDM   Medical Decision Making Amount and/or Complexity of Data Reviewed Labs: ordered.   Patient presents to the ED for: Hyperglycemia   Clinical Course as of 02/27/24 0822  Fri Feb 27, 2024  0532 Patient with T1DM here with lethargy, vomiting. Reports compliance with insulin . Per EMS, CBG 416 on their arrival. Has received 750 cc IV fluids. Patient is slow to respond, lethargic, follows command, answers appropriately. 2nd IV ordered, additional fluid bolus, labs pending.  [SU]  0607 VG obtained showing pH 7.2, bicarb 14, c/w moderate DKA.  [SU]  [id number] Patient care signed out to oncoming provider. Anticipate admission for treatment DKA. Insulin  infusion ordered. Patient stable.  [SU]  0705 CBC with Differential(!) No acute findings [ML]  0706 Comprehensive metabolic panel(!) Hyperkalemia 6.1, anion gap 27 [ML]  0718 Urinalysis, Routine w reflex microscopic -Urine, Clean Catch(!) Ketones 80 [ML]  [id number] ED EKG Sinus rhythm, hyperacute Twaves  [ML]  0722 Given EKG findings of hyperacute T waves - 50 mEq bicarb and 1 g calcium  to be given  [ML]  0745 Patient given additional doses of ondansetron  and IV fluids [ML]  0755 Urine rapid drug screen (hosp performed)(!) Positive for cannabis  [ML]  9185 Contact hospitalist - [place] MD to admit [ML]    Clinical Course User Index [ML] Willma Duwaine CROME, PA [SU] Odell Balls, PA-C    Data Reviewed / Actions Taken: Labs ordered/reviewed with my independent interpretation in ED course above. EKG ordered/reviewed with my  independent interpretation in ED course above. The patient was kept on continuous cardiac monitoring during the ED stay. Key findings for the patient were reviewed with the attending physician, and ongoing clinical collaboration was maintained throughout the visit  Management / Treatments: See ED course above for medications, treatments administered, and clinical rationale.   I have reviewed the patients home medicines and have made adjustments as needed  ED Course / Reassessments: Problem List: Diabetic ketoacidosis 24 year old male presented for hyperglycemia. Initial assessment included history, physical exam, and review of prior medical records. Laboratory studies were obtained and key results included patient is acidotic with a venous pH of 7.2, CO2 36.  Patient was hyperkalemic at 6.1 with associated hyperacute T waves on EKG.  Patient's anion gap 27.  Patient had ketones noted in urinalysis.  Given findings, patient was treated for suspected DKA. Management included antiemetics, insulin , sodium bicarb, calcium  gluconate, and multiple IV fluid doses, with ongoing reassessment. Vital signs were obtained and monitored, and the patient remained stable throughout the stay.  Given overall clinical appearance, physical exam, and workup findings patient to be admitted for further evaluation and care for diabetic ketoacidosis.   Consultations:  Hospitalist - Lou MD Consult recommendations incorporated into plan: To admit patient  Disposition: Disposition: Admission Rationale for disposition: Patient requiring further medical treatment and evaluation for worsening diabetic ketoacidosis. The disposition plan and rationale were discussed with the patient at the bedside, all questions were addressed, and the patient  demonstrated understanding.  This note was produced using Electronics Engineer. While I have reviewed and verified all clinical information, transcription errors may  remain.        Willma Duwaine CROME, [place] 02/27/24 9177    Charlyn Sora, MD 02/28/24 1125

## 2024-02-28 ENCOUNTER — Encounter (HOSPITAL_COMMUNITY): Payer: Self-pay | Admitting: Student

## 2024-02-28 ENCOUNTER — Other Ambulatory Visit (HOSPITAL_COMMUNITY): Payer: Self-pay

## 2024-02-28 DIAGNOSIS — M549 Dorsalgia, unspecified: Secondary | ICD-10-CM | POA: Insufficient documentation

## 2024-02-28 DIAGNOSIS — E1010 Type 1 diabetes mellitus with ketoacidosis without coma: Secondary | ICD-10-CM | POA: Diagnosis not present

## 2024-02-28 LAB — GLUCOSE, CAPILLARY
Glucose-Capillary: 114 mg/dL — ABNORMAL HIGH (ref 70–99)
Glucose-Capillary: 190 mg/dL — ABNORMAL HIGH (ref 70–99)
Glucose-Capillary: 182 mg/dL — ABNORMAL HIGH (ref 70–99)
Glucose-Capillary: 167 mg/dL — ABNORMAL HIGH (ref 70–99)
Glucose-Capillary: 131 mg/dL — ABNORMAL HIGH (ref 70–99)
Glucose-Capillary: 257 mg/dL — ABNORMAL HIGH (ref 70–99)

## 2024-02-28 LAB — BASIC METABOLIC PANEL WITH GFR
Anion gap: 11 (ref 5–15)
Anion gap: 9 (ref 5–15)
BUN: 18 mg/dL (ref 6–20)
BUN: 15 mg/dL (ref 6–20)
CO2: 21 mmol/L — ABNORMAL LOW (ref 22–32)
CO2: 23 mmol/L (ref 22–32)
Calcium: 9.3 mg/dL (ref 8.9–10.3)
Calcium: 9.3 mg/dL (ref 8.9–10.3)
Chloride: 104 mmol/L (ref 98–111)
Chloride: 102 mmol/L (ref 98–111)
Creatinine, Ser: 1.02 mg/dL (ref 0.61–1.24)
Creatinine, Ser: 0.96 mg/dL (ref 0.61–1.24)
GFR, Estimated: >60 mL/min
GFR, Estimated: >60 mL/min
Glucose, Bld: 131 mg/dL — ABNORMAL HIGH (ref 70–99)
Glucose, Bld: 177 mg/dL — ABNORMAL HIGH (ref 70–99)
Potassium: 3.9 mmol/L (ref 3.5–5.1)
Potassium: 3.8 mmol/L (ref 3.5–5.1)
Sodium: 136 mmol/L (ref 135–145)
Sodium: 134 mmol/L — ABNORMAL LOW (ref 135–145)

## 2024-02-28 LAB — CBC
HCT: 42.8 % (ref 39.0–52.0)
Hemoglobin: 15.1 g/dL (ref 13.0–17.0)
MCH: 29.3 pg (ref 26.0–34.0)
MCHC: 35.3 g/dL (ref 30.0–36.0)
MCV: 82.9 fL (ref 80.0–100.0)
Platelets: 288 K/uL (ref 150–400)
RBC: 5.16 MIL/uL (ref 4.22–5.81)
RDW: 12.2 % (ref 11.5–15.5)
WBC: 10.1 K/uL (ref 4.0–10.5)
nRBC: 0 % (ref 0.0–0.2)

## 2024-02-28 LAB — BETA-HYDROXYBUTYRIC ACID: Beta-Hydroxybutyric Acid: 0.2 mmol/L (ref 0.05–0.27)

## 2024-02-28 MED ORDER — CYCLOBENZAPRINE HCL 10 MG PO TABS
10.0000 mg | ORAL_TABLET | Freq: Every evening | ORAL | 0 refills | Status: AC | PRN
  Filled 2024-02-28: qty 30, 30d supply, fill #0

## 2024-02-28 MED ORDER — CYCLOBENZAPRINE HCL 5 MG PO TABS
5.0000 mg | ORAL_TABLET | Freq: Three times a day (TID) | ORAL | Status: DC | PRN
  Administered 2024-02-28: 5 mg via ORAL
  Filled 2024-02-28: qty 1

## 2024-02-28 MED ORDER — INSULIN ASPART 100 UNIT/ML IJ SOLN: 0.0000 [IU] | Freq: Three times a day (TID) | INTRAMUSCULAR | Status: DC

## 2024-02-28 MED ORDER — INSULIN GLARGINE-YFGN 100 UNIT/ML ~~LOC~~ SOLN
15.0000 [IU] | Freq: Once | SUBCUTANEOUS | Status: AC
  Administered 2024-02-28: 15 [IU] via SUBCUTANEOUS
  Filled 2024-02-28: qty 0.15

## 2024-02-28 MED ORDER — INSULIN ASPART 100 UNIT/ML IJ SOLN: 0.0000 [IU] | Freq: Every day | INTRAMUSCULAR | Status: DC

## 2024-02-28 MED ORDER — INSULIN ASPART 100 UNIT/ML IJ SOLN
0.0000 [IU] | Freq: Three times a day (TID) | INTRAMUSCULAR | Status: DC
  Administered 2024-02-28: 5 [IU] via SUBCUTANEOUS
  Administered 2024-02-28: 2 [IU] via SUBCUTANEOUS
  Filled 2024-02-28: qty 2
  Filled 2024-02-28: qty 5

## 2024-02-28 NOTE — Discharge Summary (Incomplete)
 Physician Discharge Summary  Logan Washington FMW:[id number] DOB: 2000-10-20 DOA: 02/27/2024  PCP: Franchot Mom, PA-C  Admit date: 02/27/2024 Discharge date: 03/01/2024 Discharging to: home    Discharge Diagnoses:   Principal Problem:   DKA (diabetic ketoacidosis) (HCC) Active Problems:   AKI (acute kidney injury)   Influenza A   Nausea and vomiting   Substance use disorder   Back pain    HPI: Logan Washington is a 24 y.o. male with medical history significant for type 1 diabetes, DKA, ADHD, THC abuse, and cannabis hyperemesis who presents to the ED for evaluation of increased lethargy, abdominal pain, nausea and vomiting. Patient reports that since Tuesday, he has had increased lethargy and weakness with nausea, vomiting and abdominal discomfort. Patient admits to taking his insulin  but not on a consistent basis.  He denies any recent illnesses   ED Course: Initial vitals show patient afebrile and normotensive. Initial labs significant for sodium 132, K+ 6.1 (hemolyzed sample), bicarb 12, glucose 427, creatinine 1.28, anion gap 27, VBG with pH 7.2, pCO2 36, pO2 48 and bicarb 14.1, unremarkable CBC, UA positive for THC, normal ethanol levels, UA shows significant glucosuria and moderate ketonuria but no signs of infection. EKG shows sinus rhythm with peaked T waves. Pt received 1 g of IV calcium  gluconate, 1 amp of sodium bicarb IVF boluses and started on insulin  drip. TRH was consulted for admission.   Principal Problem:   DKA (diabetic ketoacidosis) and AKI - in the setting of influenza - significant improved with treatment - - A1c 9.0 - ok to dc home today  Active Problems:  Influenza - + for influenza- he had been feeling sick for a few days at home    Nausea and vomiting - resolved - tolerating solids    Substance use disorder - THC +    Back pain - usually occurs at work- stands for most of the day - discussed methods to manage and improve it with exercises            Discharge Instructions  Discharge Instructions     Discharge instructions   Complete by: As directed       Allergies as of 02/28/2024       Reactions   Banana Anaphylaxis   Tilapia [fish Allergy] Shortness Of Breath, Swelling        Medication List     TAKE these medications    cyclobenzaprine  10 MG tablet Commonly known as: FLEXERIL  Take 1 tablet (10 mg total) by mouth at bedtime as needed for muscle spasms. What changed:  when to take this reasons to take this   Insulin  Pen Needle 32G X 8 MM Misc Use as directed   Lantus  SoloStar 100 UNIT/ML Solostar Pen Generic drug: insulin  glargine Inject 28 Units into the skin at bedtime.   NovoLOG  FlexPen 100 UNIT/ML FlexPen Generic drug: insulin  aspart Inject 15-20 Units into the skin 3 (three) times daily with meals. Sliding scale , Every 7 carbs =1 unit per patient Max 30 units per day   omeprazole  20 MG capsule Commonly known as: PRILOSEC Take 20 mg by mouth 2 (two) times daily.   ondansetron  4 MG disintegrating tablet Commonly known as: ZOFRAN -ODT Take 4 mg by mouth every 6 (six) hours as needed.            The results of significant diagnostics from this hospitalization (including imaging, microbiology, ancillary and laboratory) are listed below for reference.    CT ABDOMEN PELVIS W CONTRAST Result Date:  02/16/2024 EXAM: CT ABDOMEN AND PELVIS WITH CONTRAST 02/16/2024 10:03:14 PM TECHNIQUE: CT of the abdomen and pelvis was performed with the administration of 75 mL of iohexol  (OMNIPAQUE ) 350 MG/ML injection. Multiplanar reformatted images are provided for review. Automated exposure control, iterative reconstruction, and/or weight-based adjustment of the mA/kV was utilized to reduce the radiation dose to as low as reasonably achievable. COMPARISON: None available. CLINICAL HISTORY: Abdominal pain, acute, nonlocalized. FINDINGS: LOWER CHEST: No acute abnormality. LIVER: The liver is unremarkable. GALLBLADDER  AND BILE DUCTS: Gallbladder is unremarkable. No biliary ductal dilatation. SPLEEN: No acute abnormality. PANCREAS: No acute abnormality. ADRENAL GLANDS: No acute abnormality. KIDNEYS, URETERS AND BLADDER: No stones in the kidneys or ureters. No hydronephrosis. No perinephric or periureteral stranding. Urinary bladder is unremarkable. GI AND BOWEL: Stomach demonstrates no acute abnormality. There is no bowel obstruction. PERITONEUM AND RETROPERITONEUM: No ascites. No free air. VASCULATURE: Aorta is normal in caliber. LYMPH NODES: No lymphadenopathy. REPRODUCTIVE ORGANS: No acute abnormality. BONES AND SOFT TISSUES: No acute osseous abnormality. No focal soft tissue abnormality. IMPRESSION: 1. No acute findings in the abdomen or pelvis. Electronically signed by: Franky Crease MD 02/16/2024 10:20 PM EST RP Workstation: [id number]   DG Chest Portable 1 View Result Date: 02/16/2024 CLINICAL DATA:  Short of breath EXAM: PORTABLE CHEST 1 VIEW COMPARISON:  03/15/2021 FINDINGS: The heart size and mediastinal contours are within normal limits. Both lungs are clear. The visualized skeletal structures are unremarkable. IMPRESSION: No active disease. Electronically Signed   By: Luke Bun M.D.   On: 02/16/2024 21:12   Labs:   Basic Metabolic Panel: Recent Labs  Lab 02/27/24 1433 02/27/24 1818 02/27/24 2127 02/28/24 0111 02/28/24 0516  NA 136 135 136 136 134*  K 4.7 4.3 4.0 3.9 3.8  CL 103 103 104 104 102  CO2 19* 20* 21* 21* 23  GLUCOSE 196* 151* 134* 131* 177*  BUN 21* 19 17 18 15   CREATININE 1.13 1.05 1.06 1.02 0.96  CALCIUM  9.8 9.5 9.6 9.3 9.3     CBC: Recent Labs  Lab 02/27/24 0527 02/28/24 0516  WBC 7.8 10.1  NEUTROABS 6.7  --   HGB 15.2 15.1  HCT 46.0 42.8  MCV 88.3 82.9  PLT 310 288         SIGNED:   True Atlas, MD  [facility] 03/01/2024, 4:56 PM Time taking on discharge: 50 minutes

## 2024-02-28 NOTE — Progress Notes (Signed)
 Discharge med in  a secure bag delivered to patient by this RN

## 2024-02-28 NOTE — TOC Initial Note (Signed)
 Transition of Care [facility]) - Initial/Assessment Note    Patient Details  Name: Logan Washington MRN: [id number] Date of Birth: 2001/01/13  Transition of Care [facility]) CM/SW Contact:    Sonda Manuella Quill, RN Phone Number: 02/28/2024, 10:37 AM  Clinical Narrative:                 Acknowledge consult for medication assist; spoke w/ pt in room; pt said he lives at home; he plans to return w/ family support at d/c; pt identified POC cousin Yancy Dee [phone]); pt said he needs assistance w/ transportation; he cannot find a ride home, and he cannot afford to pay for transportation; pt gave d/c address: [address] [place], [place] [address]; insurance/PCP verified; pt does not qualify for MATCH as he has insurance; pt said he has difficulty paying for housing and utilities; he declined resources for social services and financial assistance; he denied IPV; pt does not have DME, HH services, or home oxygen; Therapist, Nutritional and Release of Liability signed and placed on shadow chart; taxi voucher for pt given to Maddie, [employer]; she will call [employer] taxi and set up pick up time/location; no IP CM needs.  Expected Discharge Plan: Home/Self Care Barriers to Discharge: No Barriers Identified   Patient Goals and CMS Choice Patient states their goals for this hospitalization and ongoing recovery are:: home          Expected Discharge Plan and Services   Discharge Planning Services: CM Consult   Living arrangements for the past 2 months: Apartment                 DME Arranged: N/A DME Agency: NA       HH Arranged: NA HH Agency: NA        Prior Living Arrangements/Services Living arrangements for the past 2 months: Apartment Lives with:: Self Patient language and need for interpreter reviewed:: Yes Do you feel safe going back to the place where you live?: Yes      Need for Family Participation in Patient Care: Yes (Comment) Care giver support system in place?: Yes  (comment) Current home services:  (n/a) Criminal Activity/Legal Involvement Pertinent to Current Situation/Hospitalization: No - Comment as needed  Activities of Daily Living   ADL Screening (condition at time of admission) Independently performs ADLs?: Yes (appropriate for developmental age) Is the patient deaf or have difficulty hearing?: No Does the patient have difficulty seeing, even when wearing glasses/contacts?: No Does the patient have difficulty concentrating, remembering, or making decisions?: No  Permission Sought/Granted Permission sought to share information with : Case Manager Permission granted to share information with : Yes, Verbal Permission Granted  Share Information with NAME: Case Manager     Permission granted to share info w Relationship: Yancy Dee (cousin) [phone]     Emotional Assessment Appearance:: Appears stated age Attitude/Demeanor/Rapport: Gracious Affect (typically observed): Accepting Orientation: : Oriented to Self, Oriented to Place, Oriented to  Time, Oriented to Situation Alcohol / Substance Use: Not Applicable Psych Involvement: No (comment)  Admission diagnosis:  DKA (diabetic ketoacidosis) (HCC) [E11.10] Type 1 diabetes mellitus with ketoacidosis without coma (HCC) [E10.10] Patient Active Problem List   Diagnosis Date Noted   Back pain 02/28/2024   Substance use disorder 02/27/2024   Metabolic alkalosis 02/17/2024   DKA, type 1, not at goal [facility]) 02/16/2024   Diarrhea 07/29/2023   Nausea and vomiting 06/19/2023   Intractable vomiting 06/18/2023   Intractable nausea and vomiting 06/12/2023  Appendicitis 04/07/2023   Lab test positive for detection of COVID-19 virus 04/07/2023   Esophageal thickening 04/07/2023   Odynophagia 03/17/2021   Hypokalemia    DKA (diabetic ketoacidosis) (HCC) 03/15/2021   Dehydration 03/15/2021   Hematemesis 03/15/2021   Leukocytosis 03/15/2021   Hyperkalemia 03/15/2021   Influenza A  01/24/2018   Abdominal pain 01/24/2018   DKA, type 1 (HCC) 01/23/2018   AKI (acute kidney injury) 01/23/2018   Abnormal LFTs 01/23/2018   Chest pain 01/23/2018   Diabetes mellitus without complication (HCC)    Sports physical 10/25/2015   PCP:  Franchot Mom, PA-C Pharmacy:   [id number] [place], [place] - [address] [place] [address] Phone: [phone] Fax: [phone]  [facility] [place] [facility] [address] [place] [address] Phone: [phone] Fax: [phone]     Social Drivers of Health (SDOH) Social History: SDOH Screenings   Food Insecurity: No Food Insecurity (02/28/2024)  Recent Concern: Food Insecurity - Food Insecurity Present (02/27/2024)  Housing: High Risk (02/28/2024)  Transportation Needs: Unmet Transportation Needs (02/28/2024)  Utilities: At Risk (02/28/2024)  Social Connections: Socially Isolated (02/27/2024)  Tobacco Use: High Risk (02/28/2024)   SDOH Interventions: Food Insecurity Interventions: Intervention Not Indicated, Inpatient TOC Housing Interventions: Intervention Not Indicated, Inpatient TOC, Patient Declined Transportation Interventions: Inpatient TOC, Taxi Voucher Given Utilities Interventions: Patient Declined, Intervention Not Indicated, Inpatient TOC   Readmission Risk Interventions     No data to display

## 2024-03-03 ENCOUNTER — Emergency Department (HOSPITAL_COMMUNITY)

## 2024-03-03 ENCOUNTER — Other Ambulatory Visit: Payer: Self-pay

## 2024-03-03 ENCOUNTER — Inpatient Hospital Stay (HOSPITAL_COMMUNITY)
Admission: EM | Admit: 2024-03-03 | Discharge: 2024-03-05 | DRG: 639 | Disposition: A | Discharge status: Home / Self Care | Attending: Internal Medicine | Admitting: Internal Medicine

## 2024-03-03 ENCOUNTER — Encounter (HOSPITAL_COMMUNITY): Payer: Self-pay | Admitting: Emergency Medicine

## 2024-03-03 DIAGNOSIS — Z800 Family history of malignant neoplasm of digestive organs: Secondary | ICD-10-CM

## 2024-03-03 DIAGNOSIS — Z794 Long term (current) use of insulin: Secondary | ICD-10-CM

## 2024-03-03 DIAGNOSIS — R112 Nausea with vomiting, unspecified: Secondary | ICD-10-CM | POA: Diagnosis present

## 2024-03-03 DIAGNOSIS — D751 Secondary polycythemia: Secondary | ICD-10-CM | POA: Diagnosis present

## 2024-03-03 DIAGNOSIS — E860 Dehydration: Secondary | ICD-10-CM | POA: Diagnosis present

## 2024-03-03 DIAGNOSIS — R918 Other nonspecific abnormal finding of lung field: Secondary | ICD-10-CM | POA: Diagnosis present

## 2024-03-03 DIAGNOSIS — E876 Hypokalemia: Secondary | ICD-10-CM | POA: Diagnosis present

## 2024-03-03 DIAGNOSIS — R1116 Cannabis hyperemesis syndrome: Secondary | ICD-10-CM | POA: Diagnosis present

## 2024-03-03 DIAGNOSIS — F909 Attention-deficit hyperactivity disorder, unspecified type: Secondary | ICD-10-CM | POA: Diagnosis present

## 2024-03-03 DIAGNOSIS — Z803 Family history of malignant neoplasm of breast: Secondary | ICD-10-CM

## 2024-03-03 DIAGNOSIS — Z91018 Allergy to other foods: Secondary | ICD-10-CM

## 2024-03-03 DIAGNOSIS — R9431 Abnormal electrocardiogram [ECG] [EKG]: Secondary | ICD-10-CM | POA: Diagnosis present

## 2024-03-03 DIAGNOSIS — Z801 Family history of malignant neoplasm of trachea, bronchus and lung: Secondary | ICD-10-CM

## 2024-03-03 DIAGNOSIS — Z1152 Encounter for screening for COVID-19: Secondary | ICD-10-CM

## 2024-03-03 DIAGNOSIS — Z91013 Allergy to seafood: Secondary | ICD-10-CM

## 2024-03-03 DIAGNOSIS — F17210 Nicotine dependence, cigarettes, uncomplicated: Secondary | ICD-10-CM | POA: Diagnosis present

## 2024-03-03 DIAGNOSIS — R7989 Other specified abnormal findings of blood chemistry: Secondary | ICD-10-CM | POA: Diagnosis present

## 2024-03-03 DIAGNOSIS — E869 Volume depletion, unspecified: Secondary | ICD-10-CM | POA: Diagnosis present

## 2024-03-03 DIAGNOSIS — K2289 Other specified disease of esophagus: Secondary | ICD-10-CM | POA: Diagnosis present

## 2024-03-03 DIAGNOSIS — F1210 Cannabis abuse, uncomplicated: Secondary | ICD-10-CM | POA: Diagnosis present

## 2024-03-03 DIAGNOSIS — E1010 Type 1 diabetes mellitus with ketoacidosis without coma: Principal | ICD-10-CM | POA: Diagnosis present

## 2024-03-03 DIAGNOSIS — R1310 Dysphagia, unspecified: Secondary | ICD-10-CM | POA: Diagnosis present

## 2024-03-03 DIAGNOSIS — R079 Chest pain, unspecified: Secondary | ICD-10-CM | POA: Diagnosis present

## 2024-03-03 LAB — COMPREHENSIVE METABOLIC PANEL WITH GFR
ALT: 21 U/L (ref 0–44)
AST: 25 U/L (ref 15–41)
Albumin: 4.1 g/dL (ref 3.5–5.0)
Alkaline Phosphatase: 80 U/L (ref 38–126)
Anion gap: 23 — ABNORMAL HIGH (ref 5–15)
BUN: 14 mg/dL (ref 6–20)
CO2: 18 mmol/L — ABNORMAL LOW (ref 22–32)
Calcium: 9.4 mg/dL (ref 8.9–10.3)
Chloride: 92 mmol/L — ABNORMAL LOW (ref 98–111)
Creatinine, Ser: 0.92 mg/dL (ref 0.61–1.24)
GFR, Estimated: >60 mL/min
Glucose, Bld: 352 mg/dL — ABNORMAL HIGH (ref 70–99)
Potassium: 4.5 mmol/L (ref 3.5–5.1)
Sodium: 133 mmol/L — ABNORMAL LOW (ref 135–145)
Total Bilirubin: 1.5 mg/dL — ABNORMAL HIGH (ref 0.0–1.2)
Total Protein: 7.7 g/dL (ref 6.5–8.1)

## 2024-03-03 LAB — BLOOD GAS, VENOUS
Acid-base deficit: 1.1 mmol/L (ref 0.0–2.0)
Bicarbonate: 22.4 mmol/L (ref 20.0–28.0)
O2 Saturation: 80.3 %
Patient temperature: 37
pCO2, Ven: 33 mmHg — ABNORMAL LOW (ref 44–60)
pH, Ven: 7.44 — ABNORMAL HIGH (ref 7.25–7.43)
pO2, Ven: 51 mmHg — ABNORMAL HIGH (ref 32–45)

## 2024-03-03 LAB — CBC
HCT: 48.6 % (ref 39.0–52.0)
Hemoglobin: 18 g/dL — ABNORMAL HIGH (ref 13.0–17.0)
MCH: 29.8 pg (ref 26.0–34.0)
MCHC: 37 g/dL — ABNORMAL HIGH (ref 30.0–36.0)
MCV: 80.3 fL (ref 80.0–100.0)
Platelets: 366 K/uL (ref 150–400)
RBC: 6.05 MIL/uL — ABNORMAL HIGH (ref 4.22–5.81)
RDW: 11.9 % (ref 11.5–15.5)
WBC: 7.8 K/uL (ref 4.0–10.5)
nRBC: 0 % (ref 0.0–0.2)

## 2024-03-03 LAB — LIPASE, BLOOD: Lipase: 24 U/L (ref 11–51)

## 2024-03-03 LAB — CBG MONITORING, ED: Glucose-Capillary: 325 mg/dL — ABNORMAL HIGH (ref 70–99)

## 2024-03-03 MED ORDER — LACTATED RINGERS IV BOLUS
1000.0000 mL | Freq: Once | INTRAVENOUS | Status: AC
  Administered 2024-03-03: 1000 mL via INTRAVENOUS

## 2024-03-03 MED ORDER — METOCLOPRAMIDE HCL 5 MG/ML IJ SOLN
10.0000 mg | Freq: Once | INTRAMUSCULAR | Status: AC
  Administered 2024-03-03: 10 mg via INTRAVENOUS
  Filled 2024-03-03: qty 2

## 2024-03-03 MED ORDER — ONDANSETRON HCL 4 MG/2ML IJ SOLN
4.0000 mg | Freq: Once | INTRAMUSCULAR | Status: AC | PRN
  Administered 2024-03-03: 4 mg via INTRAVENOUS
  Filled 2024-03-03: qty 2

## 2024-03-03 MED ORDER — IOHEXOL 300 MG/ML  SOLN
100.0000 mL | Freq: Once | INTRAMUSCULAR | Status: AC | PRN
  Administered 2024-03-03: 100 mL via INTRAVENOUS

## 2024-03-03 MED ORDER — MORPHINE SULFATE (PF) 4 MG/ML IV SOLN
4.0000 mg | Freq: Once | INTRAVENOUS | Status: AC
  Administered 2024-03-03: 4 mg via INTRAVENOUS
  Filled 2024-03-03: qty 1

## 2024-03-03 MED ORDER — DIPHENHYDRAMINE HCL 50 MG/ML IJ SOLN
25.0000 mg | Freq: Once | INTRAMUSCULAR | Status: AC
  Administered 2024-03-03: 25 mg via INTRAVENOUS
  Filled 2024-03-03: qty 1

## 2024-03-03 NOTE — ED Provider Notes (Signed)
 " [place] [facility]GENC[facility] DE[facility] AT [facility] Provider Note   CSN: [id number] Arrival date & time: 03/03/24  2106     Patient presents with: Abdominal Pain and Nausea   Logan Washington is a 24 y.o. male with PMHx DM who presents to ED concerned for abdominal pain that started ~6 hours ago. Today, pt states he has vomited more than 10 times since pain onset. Pt is actively vomiting and dry heaving throughout my evaluation. Pt states he has also had a subjective fever since onset. Patient feels a little SOB when he becomes very nauseated. Pt reports he was admitted to hospital with the flu last week and was discharged 4 days ago. Pt states he has not eaten anything unusual recently and has not been around anyone that is sick. Pt states he has not been able to keep food or liquids down since onset. He tried Zofran  at home but this did not provide any relief. Pt states he did not take his insulin  today.  {Add pertinent medical, surgical, social history, OB history to HPI:32947}  Abdominal Pain      Prior to Admission medications  Medication Sig Start Date End Date Taking? Authorizing Provider  cyclobenzaprine  (FLEXERIL ) 10 MG tablet Take 1 tablet (10 mg total) by mouth at bedtime as needed for muscle spasms. 02/28/24   Rizwan, Saima, MD  Insulin  Pen Needle 32G X 8 MM MISC Use as directed 02/17/24   Pokhrel, Vernal, MD  LANTUS  SOLOSTAR 100 UNIT/ML Solostar Pen Inject 28 Units into the skin at bedtime. 02/17/24 05/17/24  Pokhrel, Vernal, MD  NOVOLOG  FLEXPEN 100 UNIT/ML FlexPen Inject 15-20 Units into the skin 3 (three) times daily with meals. Sliding scale , Every 7 carbs =1 unit per patient Max 30 units per day 02/17/24   Pokhrel, Laxman, MD  omeprazole  (PRILOSEC) 20 MG capsule Take 20 mg by mouth 2 (two) times daily. 12/01/23   [provider]  ondansetron  (ZOFRAN -ODT) 4 MG disintegrating tablet Take 4 mg by mouth every 6 (six) hours as needed. 11/27/23   [provider]     Allergies: Banana and Tilapia [fish allergy]    Review of Systems  Gastrointestinal:  Positive for abdominal pain.    Updated Vital Signs BP (!) 152/111 (BP Location: Right Arm)   Pulse (!) 105   Temp 98.2 F (36.8 C) (Oral)   Resp 17   SpO2 99%   Physical Exam Vitals and nursing note reviewed.  Constitutional:      General: He is not in acute distress.    Appearance: He is not ill-appearing or toxic-appearing.  HENT:     Head: Normocephalic and atraumatic.     Mouth/Throat:     Mouth: Mucous membranes are moist.     Pharynx: No posterior oropharyngeal erythema.  Eyes:     General: No scleral icterus.       Right eye: No discharge.        Left eye: No discharge.     Conjunctiva/sclera: Conjunctivae normal.  Cardiovascular:     Rate and Rhythm: Normal rate and regular rhythm.     Pulses: Normal pulses.     Heart sounds: Normal heart sounds. No murmur heard. Pulmonary:     Effort: Pulmonary effort is normal. No respiratory distress.     Breath sounds: Normal breath sounds. No wheezing, rhonchi or rales.  Abdominal:     General: Abdomen is flat. Bowel sounds are normal. There is no distension.  Palpations: Abdomen is soft. There is no mass.     Tenderness: There is generalized abdominal tenderness. There is guarding.  Musculoskeletal:     Right lower leg: No edema.     Left lower leg: No edema.  Skin:    General: Skin is warm and dry.     Findings: No rash.  Neurological:     General: No focal deficit present.     Mental Status: He is alert and oriented to person, place, and time. Mental status is at baseline.  Psychiatric:        Mood and Affect: Mood normal.        Behavior: Behavior normal.     (all labs ordered are listed, but only abnormal results are displayed) Labs Reviewed  CBC - Abnormal; Notable for the following components:      Result Value   RBC 6.05 (*)    Hemoglobin 18.0 (*)    MCHC 37.0 (*)    All other components within normal  limits  CBG MONITORING, ED - Abnormal; Notable for the following components:   Glucose-Capillary 325 (*)    All other components within normal limits  URINALYSIS, ROUTINE W REFLEX MICROSCOPIC  COMPREHENSIVE METABOLIC PANEL WITH GFR  LIPASE, BLOOD  BLOOD GAS, VENOUS  BETA-HYDROXYBUTYRIC ACID    EKG: None  Radiology: No results found.  {Document cardiac monitor, telemetry assessment procedure when appropriate:32947} Procedures   Medications Ordered in the ED  metoCLOPramide  (REGLAN ) injection 10 mg (has no administration in time range)  diphenhydrAMINE  (BENADRYL ) injection 25 mg (has no administration in time range)  lactated ringers  bolus 1,000 mL (has no administration in time range)  morphine  (PF) 4 MG/ML injection 4 mg (has no administration in time range)  ondansetron  (ZOFRAN ) injection 4 mg (4 mg Intravenous Given 03/03/24 2125)      {Click here for ABCD2, HEART and other calculators REFRESH Note before signing:1}                              Medical Decision Making Amount and/or Complexity of Data Reviewed Labs: ordered. Radiology: ordered.  Risk Prescription drug management.    This patient presents to the ED for concern of abdominal pain, this involves an extensive number of treatment options, and is a complaint that carries with it a high risk of complications and morbidity.  The differential diagnosis includes gastroenteritis, colitis, small bowel obstruction, appendicitis, cholecystitis, pancreatitis, nephrolithiasis, UTI, pyelonephritis   Co morbidities that complicate the patient evaluation  DM   Additional history obtained:  Additional history obtained from prior ED/admission notes   Problem List / ED Course / Critical interventions / Medication management  Patient presented for abdominal pain. On exam patient was *** I Ordered, and personally interpreted labs.  *** I ordered imaging studies including CT Abd/Pelvis with contrast: evaluate for  structural/surgical etiology of patients' severe abdominal pain. I independently visualized and interpreted imaging and I agree with the radiologist interpretation of ***. I requested consultation with the ***,  and discussed lab and imaging findings as well as pertinent plan - they recommend: *** I have reviewed the patients home medicines and have made adjustments as needed The patient has been appropriately medically screened and/or stabilized in the ED. I have low suspicion for any other emergent medical condition which would require further screening, evaluation or treatment in the ED or require inpatient management. At time of discharge the patient is hemodynamically stable and  in no acute distress. I have discussed work-up results and diagnosis with patient and answered all questions. Patient is agreeable with discharge plan. We discussed strict return precautions for returning to the emergency department and they verbalized understanding.    Social Determinants of Health:  none    {Document critical care time when appropriate  Document review of labs and clinical decision tools ie CHADS2VASC2, etc  Document your independent review of radiology images and any outside records  Document your discussion with family members, caretakers and with consultants  Document social determinants of health affecting pt's care  Document your decision making why or why not admission, treatments were needed:32947:::1}   Final diagnoses:  None    ED Discharge Orders     None        "

## 2024-03-03 NOTE — ED Notes (Signed)
 EMS found the pt's key in the ambulance and handed to ED staff. Staff entered the triage 2 and the pt asked if they could place his key next to the key board in the room.

## 2024-03-03 NOTE — ED Triage Notes (Signed)
 Pt arrives w/ [facility] w/ c/o n/v. Was dx w/ flu last week. Hx DKA and reports he hasn't taken insulin  today. CBG 285 w/ EMS. VSS

## 2024-03-04 ENCOUNTER — Inpatient Hospital Stay (HOSPITAL_COMMUNITY)

## 2024-03-04 DIAGNOSIS — R9431 Abnormal electrocardiogram [ECG] [EKG]: Secondary | ICD-10-CM

## 2024-03-04 DIAGNOSIS — E869 Volume depletion, unspecified: Secondary | ICD-10-CM | POA: Diagnosis present

## 2024-03-04 DIAGNOSIS — R918 Other nonspecific abnormal finding of lung field: Secondary | ICD-10-CM | POA: Diagnosis present

## 2024-03-04 DIAGNOSIS — E1010 Type 1 diabetes mellitus with ketoacidosis without coma: Secondary | ICD-10-CM | POA: Diagnosis present

## 2024-03-04 DIAGNOSIS — F1210 Cannabis abuse, uncomplicated: Secondary | ICD-10-CM | POA: Diagnosis present

## 2024-03-04 DIAGNOSIS — Z800 Family history of malignant neoplasm of digestive organs: Secondary | ICD-10-CM | POA: Diagnosis not present

## 2024-03-04 DIAGNOSIS — F909 Attention-deficit hyperactivity disorder, unspecified type: Secondary | ICD-10-CM | POA: Diagnosis present

## 2024-03-04 DIAGNOSIS — Z794 Long term (current) use of insulin: Secondary | ICD-10-CM | POA: Diagnosis not present

## 2024-03-04 DIAGNOSIS — R1116 Cannabis hyperemesis syndrome: Secondary | ICD-10-CM | POA: Diagnosis present

## 2024-03-04 DIAGNOSIS — R7989 Other specified abnormal findings of blood chemistry: Secondary | ICD-10-CM | POA: Diagnosis present

## 2024-03-04 DIAGNOSIS — E876 Hypokalemia: Secondary | ICD-10-CM | POA: Diagnosis present

## 2024-03-04 DIAGNOSIS — Z803 Family history of malignant neoplasm of breast: Secondary | ICD-10-CM | POA: Diagnosis not present

## 2024-03-04 DIAGNOSIS — F17210 Nicotine dependence, cigarettes, uncomplicated: Secondary | ICD-10-CM | POA: Diagnosis present

## 2024-03-04 DIAGNOSIS — Z801 Family history of malignant neoplasm of trachea, bronchus and lung: Secondary | ICD-10-CM | POA: Diagnosis not present

## 2024-03-04 DIAGNOSIS — Z91013 Allergy to seafood: Secondary | ICD-10-CM | POA: Diagnosis not present

## 2024-03-04 DIAGNOSIS — E860 Dehydration: Secondary | ICD-10-CM | POA: Diagnosis present

## 2024-03-04 DIAGNOSIS — D751 Secondary polycythemia: Secondary | ICD-10-CM | POA: Diagnosis present

## 2024-03-04 DIAGNOSIS — Z91018 Allergy to other foods: Secondary | ICD-10-CM | POA: Diagnosis not present

## 2024-03-04 DIAGNOSIS — Z1152 Encounter for screening for COVID-19: Secondary | ICD-10-CM | POA: Diagnosis not present

## 2024-03-04 DIAGNOSIS — R112 Nausea with vomiting, unspecified: Secondary | ICD-10-CM | POA: Diagnosis present

## 2024-03-04 DIAGNOSIS — R1310 Dysphagia, unspecified: Secondary | ICD-10-CM | POA: Diagnosis present

## 2024-03-04 LAB — URINE DRUG SCREEN
Amphetamines: NEGATIVE
Barbiturates: NEGATIVE
Benzodiazepines: NEGATIVE
Cocaine: NEGATIVE
Fentanyl: NEGATIVE
Methadone Scn, Ur: NEGATIVE
Opiates: POSITIVE — AB
Tetrahydrocannabinol: POSITIVE — AB

## 2024-03-04 LAB — BASIC METABOLIC PANEL WITH GFR
Anion gap: 25 — ABNORMAL HIGH (ref 5–15)
Anion gap: 14 (ref 5–15)
Anion gap: 12 (ref 5–15)
Anion gap: 8 (ref 5–15)
Anion gap: 8 (ref 5–15)
BUN: 16 mg/dL (ref 6–20)
BUN: 14 mg/dL (ref 6–20)
BUN: 13 mg/dL (ref 6–20)
BUN: 12 mg/dL (ref 6–20)
BUN: 10 mg/dL (ref 6–20)
CO2: 15 mmol/L — ABNORMAL LOW (ref 22–32)
CO2: 20 mmol/L — ABNORMAL LOW (ref 22–32)
CO2: 23 mmol/L (ref 22–32)
CO2: 27 mmol/L (ref 22–32)
CO2: 28 mmol/L (ref 22–32)
Calcium: 9.7 mg/dL (ref 8.9–10.3)
Calcium: 9.4 mg/dL (ref 8.9–10.3)
Calcium: 8.9 mg/dL (ref 8.9–10.3)
Calcium: 9.2 mg/dL (ref 8.9–10.3)
Calcium: 8.8 mg/dL — ABNORMAL LOW (ref 8.9–10.3)
Chloride: 94 mmol/L — ABNORMAL LOW (ref 98–111)
Chloride: 99 mmol/L (ref 98–111)
Chloride: 100 mmol/L (ref 98–111)
Chloride: 100 mmol/L (ref 98–111)
Chloride: 101 mmol/L (ref 98–111)
Creatinine, Ser: 0.95 mg/dL (ref 0.61–1.24)
Creatinine, Ser: 0.93 mg/dL (ref 0.61–1.24)
Creatinine, Ser: 0.97 mg/dL (ref 0.61–1.24)
Creatinine, Ser: 0.91 mg/dL (ref 0.61–1.24)
Creatinine, Ser: 0.91 mg/dL (ref 0.61–1.24)
GFR, Estimated: >60 mL/min
GFR, Estimated: >60 mL/min
GFR, Estimated: >60 mL/min
GFR, Estimated: >60 mL/min
GFR, Estimated: >60 mL/min
Glucose, Bld: 387 mg/dL — ABNORMAL HIGH (ref 70–99)
Glucose, Bld: 182 mg/dL — ABNORMAL HIGH (ref 70–99)
Glucose, Bld: 202 mg/dL — ABNORMAL HIGH (ref 70–99)
Glucose, Bld: 206 mg/dL — ABNORMAL HIGH (ref 70–99)
Glucose, Bld: 139 mg/dL — ABNORMAL HIGH (ref 70–99)
Potassium: 5.2 mmol/L — ABNORMAL HIGH (ref 3.5–5.1)
Potassium: 4.7 mmol/L (ref 3.5–5.1)
Potassium: 4.2 mmol/L (ref 3.5–5.1)
Potassium: 4 mmol/L (ref 3.5–5.1)
Potassium: 3.1 mmol/L — ABNORMAL LOW (ref 3.5–5.1)
Sodium: 133 mmol/L — ABNORMAL LOW (ref 135–145)
Sodium: 134 mmol/L — ABNORMAL LOW (ref 135–145)
Sodium: 135 mmol/L (ref 135–145)
Sodium: 135 mmol/L (ref 135–145)
Sodium: 138 mmol/L (ref 135–145)

## 2024-03-04 LAB — URINALYSIS, ROUTINE W REFLEX MICROSCOPIC
Bilirubin Urine: NEGATIVE
Glucose, UA: >=500 mg/dL — AB
Hgb urine dipstick: NEGATIVE
Ketones, ur: 80 mg/dL — AB
Leukocytes,Ua: NEGATIVE
Nitrite: NEGATIVE
Protein, ur: NEGATIVE mg/dL
Specific Gravity, Urine: 1.042 — ABNORMAL HIGH (ref 1.005–1.030)
pH: 5 (ref 5.0–8.0)

## 2024-03-04 LAB — GLUCOSE, CAPILLARY
Glucose-Capillary: 174 mg/dL — ABNORMAL HIGH (ref 70–99)
Glucose-Capillary: 177 mg/dL — ABNORMAL HIGH (ref 70–99)
Glucose-Capillary: 176 mg/dL — ABNORMAL HIGH (ref 70–99)
Glucose-Capillary: 166 mg/dL — ABNORMAL HIGH (ref 70–99)
Glucose-Capillary: 210 mg/dL — ABNORMAL HIGH (ref 70–99)
Glucose-Capillary: 180 mg/dL — ABNORMAL HIGH (ref 70–99)
Glucose-Capillary: 176 mg/dL — ABNORMAL HIGH (ref 70–99)
Glucose-Capillary: 178 mg/dL — ABNORMAL HIGH (ref 70–99)
Glucose-Capillary: 177 mg/dL — ABNORMAL HIGH (ref 70–99)
Glucose-Capillary: 162 mg/dL — ABNORMAL HIGH (ref 70–99)
Glucose-Capillary: 133 mg/dL — ABNORMAL HIGH (ref 70–99)
Glucose-Capillary: 124 mg/dL — ABNORMAL HIGH (ref 70–99)
Glucose-Capillary: 153 mg/dL — ABNORMAL HIGH (ref 70–99)

## 2024-03-04 LAB — CBC
HCT: 42.2 % (ref 39.0–52.0)
Hemoglobin: 15.2 g/dL (ref 13.0–17.0)
MCH: 29.2 pg (ref 26.0–34.0)
MCHC: 36 g/dL (ref 30.0–36.0)
MCV: 81.2 fL (ref 80.0–100.0)
Platelets: 425 K/uL — ABNORMAL HIGH (ref 150–400)
RBC: 5.2 MIL/uL (ref 4.22–5.81)
RDW: 12 % (ref 11.5–15.5)
WBC: 9.3 K/uL (ref 4.0–10.5)
nRBC: 0 % (ref 0.0–0.2)

## 2024-03-04 LAB — RESP PANEL BY RT-PCR (RSV, FLU A&B, COVID)  RVPGX2
Influenza A by PCR: NEGATIVE
Influenza B by PCR: NEGATIVE
Resp Syncytial Virus by PCR: NEGATIVE
SARS Coronavirus 2 by RT PCR: NEGATIVE

## 2024-03-04 LAB — CBG MONITORING, ED
Glucose-Capillary: 323 mg/dL — ABNORMAL HIGH (ref 70–99)
Glucose-Capillary: 384 mg/dL — ABNORMAL HIGH (ref 70–99)
Glucose-Capillary: 274 mg/dL — ABNORMAL HIGH (ref 70–99)
Glucose-Capillary: 216 mg/dL — ABNORMAL HIGH (ref 70–99)
Glucose-Capillary: 228 mg/dL — ABNORMAL HIGH (ref 70–99)

## 2024-03-04 LAB — BETA-HYDROXYBUTYRIC ACID
Beta-Hydroxybutyric Acid: 5.4 mmol/L — ABNORMAL HIGH (ref 0.05–0.27)
Beta-Hydroxybutyric Acid: 2.44 mmol/L — ABNORMAL HIGH (ref 0.05–0.27)
Beta-Hydroxybutyric Acid: 0.63 mmol/L — ABNORMAL HIGH (ref 0.05–0.27)

## 2024-03-04 LAB — MRSA NEXT GEN BY PCR, NASAL: MRSA by PCR Next Gen: NOT DETECTED

## 2024-03-04 LAB — ECHOCARDIOGRAM COMPLETE
Area-P 1/2: 4.01 cm2
Height: 70 in
S' Lateral: 2.6 cm
Weight: 2306.89 [oz_av]

## 2024-03-04 LAB — TROPONIN T, HIGH SENSITIVITY
Troponin T High Sensitivity: 7 ng/L (ref 0–19)
Troponin T High Sensitivity: 8 ng/L (ref 0–19)

## 2024-03-04 MED ORDER — ONDANSETRON HCL 4 MG/2ML IJ SOLN: 4.0000 mg | Freq: Four times a day (QID) | INTRAMUSCULAR | Status: DC | PRN

## 2024-03-04 MED ORDER — PROCHLORPERAZINE EDISYLATE 10 MG/2ML IJ SOLN: 10.0000 mg | Freq: Four times a day (QID) | INTRAMUSCULAR | Status: DC | PRN

## 2024-03-04 MED ORDER — PROCHLORPERAZINE EDISYLATE 10 MG/2ML IJ SOLN
5.0000 mg | Freq: Four times a day (QID) | INTRAMUSCULAR | Status: DC | PRN
  Administered 2024-03-04: 5 mg via INTRAVENOUS
  Filled 2024-03-04: qty 2

## 2024-03-04 MED ORDER — INSULIN ASPART 100 UNIT/ML IJ SOLN
0.0000 [IU] | Freq: Three times a day (TID) | INTRAMUSCULAR | Status: DC
  Administered 2024-03-04: 2 [IU] via SUBCUTANEOUS
  Filled 2024-03-04: qty 2

## 2024-03-04 MED ORDER — ENOXAPARIN SODIUM 40 MG/0.4ML IJ SOSY
40.0000 mg | PREFILLED_SYRINGE | INTRAMUSCULAR | Status: DC
  Administered 2024-03-04 – 2024-03-05 (×2): 40 mg via SUBCUTANEOUS
  Filled 2024-03-04 (×2): qty 0.4

## 2024-03-04 MED ORDER — CHLORHEXIDINE GLUCONATE CLOTH 2 % EX PADS
6.0000 | MEDICATED_PAD | Freq: Every day | CUTANEOUS | Status: DC
  Administered 2024-03-04: 6 via TOPICAL

## 2024-03-04 MED ORDER — MORPHINE SULFATE (PF) 4 MG/ML IV SOLN: 4.0000 mg | INTRAVENOUS | Status: DC | PRN

## 2024-03-04 MED ORDER — PANTOPRAZOLE SODIUM 40 MG IV SOLR
40.0000 mg | Freq: Once | INTRAVENOUS | Status: AC
  Administered 2024-03-04: 40 mg via INTRAVENOUS
  Filled 2024-03-04: qty 10

## 2024-03-04 MED ORDER — DEXTROSE IN LACTATED RINGERS 5 % IV SOLN: INTRAVENOUS | Status: AC

## 2024-03-04 MED ORDER — INSULIN REGULAR(HUMAN) IN NACL 100-0.9 UT/100ML-% IV SOLN
INTRAVENOUS | Status: DC
  Administered 2024-03-04: 9.5 [IU]/h via INTRAVENOUS
  Filled 2024-03-04: qty 100

## 2024-03-04 MED ORDER — PANTOPRAZOLE SODIUM 40 MG PO TBEC
40.0000 mg | DELAYED_RELEASE_TABLET | Freq: Every day | ORAL | Status: DC
  Administered 2024-03-04 – 2024-03-05 (×2): 40 mg via ORAL
  Filled 2024-03-04 (×2): qty 1

## 2024-03-04 MED ORDER — ENSURE PLUS HIGH PROTEIN PO LIQD
237.0000 mL | Freq: Two times a day (BID) | ORAL | Status: DC
  Administered 2024-03-05: 237 mL via ORAL

## 2024-03-04 MED ORDER — DROPERIDOL 2.5 MG/ML IJ SOLN
1.2500 mg | Freq: Once | INTRAMUSCULAR | Status: AC
  Administered 2024-03-04: 1.25 mg via INTRAVENOUS
  Filled 2024-03-04: qty 2

## 2024-03-04 MED ORDER — METOCLOPRAMIDE HCL 5 MG/ML IJ SOLN
10.0000 mg | Freq: Three times a day (TID) | INTRAMUSCULAR | Status: DC
  Administered 2024-03-04 – 2024-03-05 (×4): 10 mg via INTRAVENOUS
  Filled 2024-03-04 (×4): qty 2

## 2024-03-04 MED ORDER — LACTATED RINGERS IV SOLN: INTRAVENOUS | Status: AC

## 2024-03-04 MED ORDER — DEXTROSE 50 % IV SOLN: 0.0000 mL | INTRAVENOUS | Status: DC | PRN

## 2024-03-04 MED ORDER — LABETALOL HCL 5 MG/ML IV SOLN
20.0000 mg | INTRAVENOUS | Status: DC | PRN
  Administered 2024-03-04: 20 mg via INTRAVENOUS
  Filled 2024-03-04: qty 4

## 2024-03-04 MED ORDER — INSULIN GLARGINE-YFGN 100 UNIT/ML ~~LOC~~ SOLN
28.0000 [IU] | Freq: Every day | SUBCUTANEOUS | Status: DC
  Administered 2024-03-04: 28 [IU] via SUBCUTANEOUS
  Filled 2024-03-04 (×2): qty 0.28

## 2024-03-04 MED ORDER — ALUM & MAG HYDROXIDE-SIMETH 200-200-20 MG/5ML PO SUSP: 30.0000 mL | ORAL | Status: DC | PRN

## 2024-03-04 MED ORDER — MELATONIN 5 MG PO TABS: 5.0000 mg | ORAL_TABLET | Freq: Every evening | ORAL | Status: DC | PRN

## 2024-03-04 MED ORDER — POLYETHYLENE GLYCOL 3350 17 G PO PACK: 17.0000 g | PACK | Freq: Every day | ORAL | Status: DC | PRN

## 2024-03-04 MED ORDER — LACTATED RINGERS IV BOLUS
1000.0000 mL | Freq: Once | INTRAVENOUS | Status: AC
  Administered 2024-03-04: 1000 mL via INTRAVENOUS

## 2024-03-04 MED ORDER — CYCLOBENZAPRINE HCL 10 MG PO TABS
10.0000 mg | ORAL_TABLET | Freq: Every evening | ORAL | Status: DC | PRN
  Administered 2024-03-04: 10 mg via ORAL
  Filled 2024-03-04: qty 1

## 2024-03-04 MED ORDER — POTASSIUM CHLORIDE 10 MEQ/100ML IV SOLN
10.0000 meq | INTRAVENOUS | Status: AC
  Administered 2024-03-04 (×2): 10 meq via INTRAVENOUS
  Filled 2024-03-04 (×2): qty 100

## 2024-03-04 MED ORDER — GUAIFENESIN 100 MG/5ML PO LIQD
10.0000 mL | ORAL | Status: DC | PRN
  Administered 2024-03-04: 10 mL via ORAL
  Filled 2024-03-04: qty 10

## 2024-03-04 MED ORDER — ACETAMINOPHEN 325 MG PO TABS: 650.0000 mg | ORAL_TABLET | Freq: Four times a day (QID) | ORAL | Status: DC | PRN

## 2024-03-04 MED ORDER — LIDOCAINE VISCOUS HCL 2 % MT SOLN
15.0000 mL | OROMUCOSAL | Status: DC | PRN
  Filled 2024-03-04: qty 15

## 2024-03-04 NOTE — Discharge Instructions (Signed)
 UTILITIES [facility] [address] [place] [phone] Rental assistance/rental hotline: [phone] ext. 340.    Utility assistance/utility hotline: [phone] ext. [address] [employer] of [employer] Consultant (heating/cooling and water assistance) [phone] (rental and utility assistance) [phone]  [employer] - call 211  TRANSPORTATION [facility] [address] [place], [place] [address] [phone]  [employer] [place] I[employer] Reservations Line: [phone]     Passengers can simply call the [employer] reservations number at [phone] for pickup. However, with [employer] same-day service is available with at least two hour notice Monday through Friday. You will need your [employer] [place] client ID# when you call for reservations. If you do not know it, you can call [phone] to request it. [employer] offers a flat fare of $8.50 per trip. This will cover travel anywhere within the city limits of [place].

## 2024-03-04 NOTE — Inpatient Diabetes Management (Signed)
 Inpatient Diabetes Program Recommendations  AACE/ADA: New Consensus Statement on Inpatient Glycemic Control (2015)  Target Ranges:  Prepandial:   less than 140 mg/dL      Peak postprandial:   less than 180 mg/dL (1-2 hours)      Critically ill patients:  140 - 180 mg/dL   Lab Results  Component Value Date   GLUCAP 177 (H) 03/04/2024   HGBA1C 9.0 (H) 02/17/2024    Review of Glycemic Control  Diabetes history: DM1 Outpatient Diabetes medications: Lantus  28 at bedtime, Novolog  15-20 TID 1:7 CHO Current orders for Inpatient glycemic control: IV insulin  per [employer] for DKA   HgbA1C - 9% Covid 19+   Inpatient Diabetes Program Recommendations:       When ready to transition:    Lantus  15 units two hours prior to discontinuation then every day to follow   Novolog  0-9 TID with meals and 0-5 HS   Novolog  6 units TID with meals if eating > 50%  DM coordinator spoke with patient during previous hospitalization on 02/27/24. Attempted to see, however, not appropriate at this time.   Thanks, Tinnie Minus, MSN, RNC-OB Diabetes Coordinator [phone] (8a-5p)

## 2024-03-04 NOTE — Progress Notes (Signed)
" °   03/04/24 1608  TOC Brief Assessment  Insurance and Status Reviewed  Patient has primary care physician Yes  Home environment has been reviewed Home with relatives  Prior level of function: Independent with ADL's  Prior/Current Home Services No current home services  Social Drivers of Health Review SDOH reviewed needs interventions;SDOH reviewed interventions complete  Readmission risk has been reviewed Yes  Transition of care needs transition of care needs identified, TOC will continue to follow    "

## 2024-03-04 NOTE — H&P (Signed)
 " History and Physical    Patient: Logan Washington FMW:[id number] DOB: 25-Sep-2000 DOA: 03/03/2024 DOS: the patient was seen and examined on 03/04/2024 PCP: Franchot Mom, PA-C  Patient coming from: Home  Chief Complaint:  Chief Complaint  Patient presents with   Abdominal Pain   Nausea   HPI: Logan Washington is a 24 y.o. male with medical history significant of ADHD, type I DM, cannabis abuse, cannabis hyperemesis who was diagnosed with influenza last week presented to the emergency department with complaints of abdominal pain, nausea and multiple episodes of emesis after missing his dose of insulin  yesterday.  He also complains of sore throat when burning chest pain.  No diarrhea, constipation, melena or hematochezia.  No flank pain, dysuria, frequency or hematuria.  He denied fever, chills, rhinorrhea, sore throat, wheezing or hemoptysis.  No chest pain, palpitations, diaphoresis, PND, orthopnea or pitting edema of the lower extremities.  No polyuria, polydipsia, polyphagia or blurred vision.   Lab work: Urinalysis had a specific gravity of 1042, glucose was greater than 500 and ketones of 80 mg/dL.  There was rare bacteria on microscopic examination.  UDS was positive for opiates and THC.  CBC showed white count 7.8, hemoglobin 18.0 g/dL and platelets 633.  Lipase level was normal.  CMP showed a CO2 of 18 mmol/L with an anion gap of 23, glucose 352 and total bilirubin 1.5 mg/dL.  The rest of the hepatic function and electrolytes are normal after sodium/chloride correction.  Normal renal function.  Venous blood gas showed a pH was 7.44, pCO2 33 and pO2 of 51 mmHg.  Normal bicarbonate and acid-base deficit.  Imaging: CT abdomen/pelvis with contrast showing clustered nodular ground glass densities in the left lung base, likely infectious/inflammatory bronchiolitis.  No acute findings in the abdomen or pelvis.  ED course: Initial vital signs were temperature 98.2 F, pulse 105, respiration 17, BP  152/111 mmHg and O2 sat 99% on room air.  The patient received droperidol  1.25 mg IVP, diphenhydramine  25 mg IVP, metoclopramide  10 mg IVP x 1 LR 2000 mL bolus, morphine  4 mg IVP x 1, ondansetron  4 mg IVP x 1 and KCl 10 mEq IVPB x 2.   Review of Systems: As mentioned in the history of present illness. All other systems reviewed and are negative. Past Medical History:  Diagnosis Date   ADHD (attention deficit hyperactivity disorder)    Diabetes (HCC)    Diabetes mellitus without complication (HCC)    Past Surgical History:  Procedure Laterality Date   APPENDECTOMY     ESOPHAGOGASTRODUODENOSCOPY (EGD) WITH PROPOFOL  N/A 03/18/2021   Procedure: ESOPHAGOGASTRODUODENOSCOPY (EGD) WITH PROPOFOL ;  Surgeon: Rosalie Kitchens, MD;  Location: [facility];  Service: Endoscopy;  Laterality: N/A;   LAPAROSCOPIC APPENDECTOMY N/A 04/08/2023   Procedure: APPENDECTOMY LAPAROSCOPIC;  Surgeon: Belinda Cough, MD;  Location: [facility] OR;  Service: General;  Laterality: N/A;   TONSILLECTOMY     Social History:  reports that he has been smoking cigarettes. He has never used smokeless tobacco. He reports current drug use. Drug: Marijuana. He reports that he does not drink alcohol.  Allergies[1]  Family History  Problem Relation Age of Onset   Breast cancer Mother    Lung cancer Mother    Colon cancer Maternal Grandmother    Sudden death Neg Hx    Heart attack Neg Hx    Stomach cancer Neg Hx    Pancreatic cancer Neg Hx    Esophageal cancer Neg Hx    Rectal cancer Neg Hx  Prior to Admission medications  Medication Sig Start Date End Date Taking? Authorizing Provider  cyclobenzaprine  (FLEXERIL ) 10 MG tablet Take 1 tablet (10 mg total) by mouth at bedtime as needed for muscle spasms. 02/28/24   Rizwan, Saima, MD  Insulin  Pen Needle 32G X 8 MM MISC Use as directed 02/17/24   Pokhrel, Vernal, MD  LANTUS  SOLOSTAR 100 UNIT/ML Solostar Pen Inject 28 Units into the skin at bedtime. 02/17/24 05/17/24  Pokhrel, Vernal, MD   NOVOLOG  FLEXPEN 100 UNIT/ML FlexPen Inject 15-20 Units into the skin 3 (three) times daily with meals. Sliding scale , Every 7 carbs =1 unit per patient Max 30 units per day 02/17/24   Pokhrel, Laxman, MD  omeprazole  (PRILOSEC) 20 MG capsule Take 20 mg by mouth 2 (two) times daily. 12/01/23   [provider]  ondansetron  (ZOFRAN -ODT) 4 MG disintegrating tablet Take 4 mg by mouth every 6 (six) hours as needed. 11/27/23   [provider]    Physical Exam: Vitals:   03/04/24 0129 03/04/24 0245 03/04/24 0300 03/04/24 0550  BP:  (!) 147/91    Pulse:  90    Resp:  15    Temp: 98.9 F (37.2 C)   98.3 F (36.8 C)  TempSrc:    Oral  SpO2:  96% 98%   Weight:    65.4 kg  Height:    5' 10 (1.778 m)   Physical Exam Vitals and nursing note reviewed.  Constitutional:      General: He is awake. He is not in acute distress.    Appearance: He is well-developed. He is ill-appearing.  HENT:     Head: Normocephalic.     Nose: No rhinorrhea.     Mouth/Throat:     Mouth: Mucous membranes are dry.  Eyes:     General: No scleral icterus.    Pupils: Pupils are equal, round, and reactive to light.  Neck:     Vascular: No JVD.  Cardiovascular:     Rate and Rhythm: Regular rhythm. Tachycardia present.     Heart sounds: S1 normal and S2 normal.  Pulmonary:     Effort: Pulmonary effort is normal.     Breath sounds: Normal breath sounds. No wheezing, rhonchi or rales.  Abdominal:     General: Bowel sounds are normal.     Palpations: Abdomen is soft.     Tenderness: There is abdominal tenderness in the epigastric area. There is no right CVA tenderness or left CVA tenderness.  Musculoskeletal:     Cervical back: Neck supple.     Right lower leg: No edema.     Left lower leg: No edema.  Skin:    General: Skin is warm and dry.  Neurological:     General: No focal deficit present.     Mental Status: He is alert and oriented to person, place, and time.  Psychiatric:        Mood and  Affect: Mood is anxious.        Behavior: Behavior normal. Behavior is cooperative.     Data Reviewed:  Results are pending, will review when available.  Assessment and Plan: Principal Problem: None In the setting of:   DKA, type 1 (HCC) Associated with:   Pseudohyponatremia Observation/stepdown. Keep NPO. Continue IV fluids. Continue insulin  infusion. Monitor CBG closely. BMP every 4 hours. BHA every 8 hours. Replace electrolytes as needed. Consult diabetes coordinator. Transition to SQ insulin  per Endo tool.  Active Problems:   Chest pain Likely  due to:   Odynophagia But also had:   Abnormal EKG With ST segment changes. Troponin x 2 was normal. Echocardiogram was normal. This seems to be 100% esophageal. -Continue analgesics as needed. -Continue PPI, antiacid and viscous lidocaine .    Esophageal thickening Likely the cause of LBO. Will need to see GI as an outpatient. Besides his insulin , needs to take his PPI regularly.    Pulmonary infiltrate  Mild aspiration? -No fever or leukocytosis. -Normal O2 sat on RA. -Normal lung exam. Incentive spirometer. Will continue to monitor clinically. Defer antibiotics for now.    Erythrocytosis Secondary to volume depletion. Continue IV fluids. Follow-up hemoglobin level tomorrow morning.    Cannabis abuse Cessation advised. Explained to him that this is one of the main causes for his symptoms. The patient voiced understanding and stated that he will quit cannabis use.     Advance Care Planning:   Code Status: Full Code   Consults:   Family Communication:   Severity of Illness: The appropriate patient status for this patient is INPATIENT. Inpatient status is judged to be reasonable and necessary in order to provide the required intensity of service to ensure the patient's safety. The patient's presenting symptoms, physical exam findings, and initial radiographic and laboratory data in the context of their  chronic comorbidities is felt to place them at high risk for further clinical deterioration. Furthermore, it is not anticipated that the patient will be medically stable for discharge from the hospital within 2 midnights of admission.   * I certify that at the point of admission it is my clinical judgment that the patient will require inpatient hospital care spanning beyond 2 midnights from the point of admission due to high intensity of service, high risk for further deterioration and high frequency of surveillance required.*  Author: Alm Dorn Castor, MD 03/04/2024 7:53 AM  For on call review [email or web address].   This document was prepared using Dragon voice recognition software and may contain some unintended transcription errors.      [1]  Allergies Allergen Reactions   Banana Anaphylaxis   Tilapia [Fish Allergy] Shortness Of Breath and Swelling   "

## 2024-03-04 NOTE — Plan of Care (Signed)

## 2024-03-05 ENCOUNTER — Encounter: Payer: Self-pay | Admitting: Internal Medicine

## 2024-03-05 ENCOUNTER — Other Ambulatory Visit (HOSPITAL_COMMUNITY): Payer: Self-pay

## 2024-03-05 DIAGNOSIS — E1010 Type 1 diabetes mellitus with ketoacidosis without coma: Secondary | ICD-10-CM | POA: Diagnosis not present

## 2024-03-05 LAB — CBC
HCT: 38.6 % — ABNORMAL LOW (ref 39.0–52.0)
Hemoglobin: 13.3 g/dL (ref 13.0–17.0)
MCH: 29 pg (ref 26.0–34.0)
MCHC: 34.5 g/dL (ref 30.0–36.0)
MCV: 84.3 fL (ref 80.0–100.0)
Platelets: 420 K/uL — ABNORMAL HIGH (ref 150–400)
RBC: 4.58 MIL/uL (ref 4.22–5.81)
RDW: 12.2 % (ref 11.5–15.5)
WBC: 6.6 K/uL (ref 4.0–10.5)
nRBC: 0 % (ref 0.0–0.2)

## 2024-03-05 LAB — BASIC METABOLIC PANEL WITH GFR
Anion gap: 10 (ref 5–15)
BUN: 7 mg/dL (ref 6–20)
CO2: 28 mmol/L (ref 22–32)
Calcium: 8.8 mg/dL — ABNORMAL LOW (ref 8.9–10.3)
Chloride: 100 mmol/L (ref 98–111)
Creatinine, Ser: 0.82 mg/dL (ref 0.61–1.24)
GFR, Estimated: >60 mL/min
Glucose, Bld: 114 mg/dL — ABNORMAL HIGH (ref 70–99)
Potassium: 3.3 mmol/L — ABNORMAL LOW (ref 3.5–5.1)
Sodium: 138 mmol/L (ref 135–145)

## 2024-03-05 LAB — GLUCOSE, CAPILLARY
Glucose-Capillary: 110 mg/dL — ABNORMAL HIGH (ref 70–99)
Glucose-Capillary: 166 mg/dL — ABNORMAL HIGH (ref 70–99)

## 2024-03-05 MED ORDER — POTASSIUM CHLORIDE CRYS ER 20 MEQ PO TBCR
40.0000 meq | EXTENDED_RELEASE_TABLET | Freq: Once | ORAL | Status: AC
  Administered 2024-03-05: 40 meq via ORAL
  Filled 2024-03-05: qty 2

## 2024-03-05 MED ORDER — INSULIN ASPART 100 UNIT/ML IJ SOLN: 0.0000 [IU] | Freq: Three times a day (TID) | INTRAMUSCULAR | Status: AC

## 2024-03-05 MED ORDER — INSULIN ASPART 100 UNIT/ML IJ SOLN
0.0000 [IU] | Freq: Three times a day (TID) | INTRAMUSCULAR | Status: DC
  Administered 2024-03-05: 1 [IU] via SUBCUTANEOUS
  Filled 2024-03-05: qty 1

## 2024-03-05 NOTE — TOC CM/SW Note (Signed)
 Received a message stating pt needed a cab voucher for transportation home, offered pt bus passes and Floor RN states bus does not travel to pt home. RNCM spoke with pt Aunt Melissa McFarlend [phone] and she states she is getting off work now and will be here in 20 minutes for pt to transport pt home. No further ICM needs identified. ICM will sign off.

## 2024-03-05 NOTE — Discharge Summary (Signed)
 " Physician Discharge Summary   Patient: Logan Washington MRN: [id number] DOB: 09-05-2000  Admit date:     03/03/2024  Discharge date: 03/05/24  Discharge Physician: MDALA-GAUSI, GOLDEN PILLOW   PCP: Franchot Mom, PA-C   Recommendations at discharge:    Resume home insulin , add sliding scale insulin  and follow up with Endocrinology.   Discharge Diagnoses: Principal Problem:   DKA, type 1 (HCC) Active Problems:   Chest pain   Odynophagia   Esophageal thickening   Intractable nausea and vomiting   Erythrocytosis   Pseudohyponatremia   Abnormal EKG   Cannabis abuse   Pulmonary infiltrate  Resolved Problems:   * No resolved hospital problems. *  Hospital Course: [name] is a 24 y.o. male with PMH of T1DM, ADHD, cannabis abuse, cannabis hyperemesis who presented to the emergency department with complaints of abdominal pain, nausea and multiple episodes of emesis after missing his dose of insulin  the day prior to presentation.   Patient found to be in DKA on presentation.  He was admitted to the stepdown unit and started on DKA protocol (Endotool).  Of note, this was the patient's third presentation this month in DKA.   The rest of the hospital course is in problem-based format below:    Assessment and Plan:  T1DM presenting in DKA. Patient admitted to stepdown unit. Patient started on DKA protocol with Endo tool. Transitioned from insulin  infusion to subcutaneous insulin  on 1/22. Blood sugars remained stable. Recent A1c (02/17/2024) was 9.0 Patient was seen by the inpatient diabetes coordinator. Patient was started on additional sliding scale at discharge.  Chest pain, present on admission Likely due to odynophagia. Troponin x 2 was normal. EKG with no concerning changes. TTE was normal with EF 60 to 65%, no regional wall abnormalities. Patient treated with PPI, antacid, analgesics.  Hypokalemia Potassium was repleted as needed.   Cannabis use Patient  encouraged to quit cannabis use as it was felt to be contributing to his clinical condition.         Consultants: n/a Procedures performed: n/a  Disposition: Home Diet recommendation:  Carb modified diet DISCHARGE MEDICATION: Allergies as of 03/05/2024       Reactions   Banana Anaphylaxis   Tilapia [fish Allergy] Shortness Of Breath, Swelling        Medication List     TAKE these medications    Baqsimi Two Pack 3 MG/DOSE Powd Generic drug: Glucagon Place 1 Dose into the nose as needed (low BS).   cyclobenzaprine  10 MG tablet Commonly known as: FLEXERIL  Take 1 tablet (10 mg total) by mouth at bedtime as needed for muscle spasms. What changed: when to take this   Insulin  Pen Needle 32G X 8 MM Misc Use as directed   Lantus  SoloStar 100 UNIT/ML Solostar Pen Generic drug: insulin  glargine Inject 28 Units into the skin at bedtime.   NovoLOG  FlexPen 100 UNIT/ML FlexPen Generic drug: insulin  aspart Inject 15-20 Units into the skin 3 (three) times daily with meals. Sliding scale , Every 7 carbs =1 unit per patient Max 30 units per day What changed: Another medication with the same name was added. Make sure you understand how and when to take each.   insulin  aspart 100 UNIT/ML injection Commonly known as: novoLOG  Inject 0-6 Units into the skin 3 (three) times daily with meals. CBG < 70: Implement Hypoglycemia Standing Orders and refer to Hypoglycemia Standing Orders sidebar report CBG 70 - 120: 0 units CBG 121 - 150: 0 units CBG 151 -  200: 1 unit CBG 201-250: 2 units CBG 251-300: 3 units CBG 301-350: 4 units CBG 351-400: 5 units CBG > 400: Give 6 units and call MD What changed: You were already taking a medication with the same name, and this prescription was added. Make sure you understand how and when to take each.   omeprazole  40 MG capsule Commonly known as: PRILOSEC Take 40 mg by mouth daily. What changed: Another medication with the same name was removed.  Continue taking this medication, and follow the directions you see here.   ondansetron  4 MG disintegrating tablet Commonly known as: ZOFRAN -ODT Take 4 mg by mouth every 6 (six) hours as needed.        Discharge Exam: Filed Weights   03/04/24 0550  Weight: 65.4 kg   Physical Exam on Day of Discharge   General: Alert, cheerful, oriented X3  Oral cavity: moist mucous membranes  Neck: supple  Chest: clear to auscultation. No crackles, no wheezes  CVS: S1,S2 RRR. No murmurs  Abd: No distention, soft, non-tender. No masses palpable  Extr: No edema    Condition at discharge: good  The results of significant diagnostics from this hospitalization (including imaging, microbiology, ancillary and laboratory) are listed below for reference.   Imaging Studies: ECHOCARDIOGRAM COMPLETE Result Date: 03/04/2024    ECHOCARDIOGRAM REPORT   Patient Name:   [facility] Date of Exam: 03/04/2024 Medical Rec #:  [id number]      Height:       70.0 in Accession #:    [id number]     Weight:       144.2 lb Date of Birth:  01-29-2001     BSA:          1.816 m Patient Age:    23 years       BP:           194/88 mmHg Patient Gender: M              HR:           88 bpm. Exam Location:  Inpatient Procedure: 2D Echo, Cardiac Doppler and Color Doppler (Both Spectral and Color            Flow Doppler were utilized during procedure). Indications:    Abnormal ECG  History:        Patient has no prior history of Echocardiogram examinations.                 Signs/Symptoms:Chest Pain; Risk Factors:Diabetes.  Sonographer:    Philomena Daring Referring Phys: [id number] DAVID MANUEL ORTIZ IMPRESSIONS  1. Left ventricular ejection fraction, by estimation, is 60 to 65%. The left ventricle has normal function. The left ventricle has no regional wall motion abnormalities. Left ventricular diastolic parameters were normal.  2. Right ventricular systolic function is normal. The right ventricular size is normal. There is normal pulmonary  artery systolic pressure. The estimated right ventricular systolic pressure is 12.2 mmHg.  3. The mitral valve is normal in structure. No evidence of mitral valve regurgitation. No evidence of mitral stenosis.  4. The aortic valve is tricuspid. Aortic valve regurgitation is not visualized. No aortic stenosis is present.  5. The inferior vena cava is normal in size with greater than 50% respiratory variability, suggesting right atrial pressure of 3 mmHg. Comparison(s): No prior Echocardiogram. FINDINGS  Left Ventricle: Left ventricular ejection fraction, by estimation, is 60 to 65%. The left ventricle has normal function. The left ventricle has no regional wall motion  abnormalities. The left ventricular internal cavity size was normal in size. There is  no left ventricular hypertrophy. Left ventricular diastolic parameters were normal. Normal left ventricular filling pressure. Right Ventricle: The right ventricular size is normal. No increase in right ventricular wall thickness. Right ventricular systolic function is normal. There is normal pulmonary artery systolic pressure. The tricuspid regurgitant velocity is 1.52 m/s, and  with an assumed right atrial pressure of 3 mmHg, the estimated right ventricular systolic pressure is 12.2 mmHg. Left Atrium: Left atrial size was normal in size. Right Atrium: Right atrial size was normal in size. Pericardium: There is no evidence of pericardial effusion. Mitral Valve: The mitral valve is normal in structure. No evidence of mitral valve regurgitation. No evidence of mitral valve stenosis. Tricuspid Valve: The tricuspid valve is normal in structure. Tricuspid valve regurgitation is trivial. No evidence of tricuspid stenosis. Aortic Valve: The aortic valve is tricuspid. Aortic valve regurgitation is not visualized. No aortic stenosis is present. Pulmonic Valve: The pulmonic valve was normal in structure. Pulmonic valve regurgitation is not visualized. No evidence of pulmonic  stenosis. Aorta: The aortic root and ascending aorta are structurally normal, with no evidence of dilitation. Venous: The inferior vena cava is normal in size with greater than 50% respiratory variability, suggesting right atrial pressure of 3 mmHg. IAS/Shunts: The interatrial septum was not well visualized.  LEFT VENTRICLE PLAX 2D LVIDd:         4.70 cm   Diastology LVIDs:         2.60 cm   LV e' medial:    13.20 cm/s LV PW:         0.80 cm   LV E/e' medial:  7.9 LV IVS:        0.90 cm   LV e' lateral:   18.60 cm/s LVOT diam:     2.10 cm   LV E/e' lateral: 5.6 LV SV:         68 LV SV Index:   38 LVOT Area:     3.46 cm  RIGHT VENTRICLE             IVC RV Basal diam:  3.60 cm     IVC diam: 1.70 cm RV Mid diam:    2.80 cm RV S prime:     14.00 cm/s TAPSE (M-mode): 3.3 cm LEFT ATRIUM             Index        RIGHT ATRIUM           Index LA diam:        2.90 cm 1.60 cm/m   RA Area:     13.40 cm LA Vol (A2C):   47.2 ml 25.99 ml/m  RA Volume:   34.20 ml  18.83 ml/m LA Vol (A4C):   33.9 ml 18.66 ml/m LA Biplane Vol: 43.5 ml 23.95 ml/m  AORTIC VALVE LVOT Vmax:   142.00 cm/s LVOT Vmean:  92.500 cm/s LVOT VTI:    0.197 m  AORTA Ao Root diam: 2.80 cm Ao Asc diam:  2.80 cm MITRAL VALVE                TRICUSPID VALVE MV Area (PHT): 4.01 cm     TR Peak grad:   9.2 mmHg MV Decel Time: 189 msec     TR Vmax:        152.00 cm/s MV E velocity: 104.00 cm/s MV A velocity: 65.80 cm/s   SHUNTS MV E/A  ratio:  1.58         Systemic VTI:  0.20 m                             Systemic Diam: 2.10 cm Sunit Tolia Electronically signed by Madonna Large Signature Date/Time: 03/04/2024/12:11:33 PM    Final    CT ABDOMEN PELVIS W CONTRAST Result Date: 03/03/2024 EXAM: CT ABDOMEN AND PELVIS WITH CONTRAST 03/03/2024 11:39:47 PM TECHNIQUE: CT of the abdomen and pelvis was performed with the administration of 100 mL iohexol  (OMNIPAQUE ) 300 MG/ML solution. Multiplanar reformatted images are provided for review. Automated exposure control, iterative  reconstruction, and/or weight-based adjustment of the mA/kV was utilized to reduce the radiation dose to as low as reasonably achievable. COMPARISON: 02/16/2024 CLINICAL HISTORY: Abdominal pain, acute, nonlocalized. FINDINGS: LOWER CHEST: Clustered nodular ground-glass densities in the left lung base, likely infectious/inflammatory bronchiolitis. LIVER: The liver is unremarkable. GALLBLADDER AND BILE DUCTS: Gallbladder is unremarkable. No biliary ductal dilatation. SPLEEN: No acute abnormality. PANCREAS: No acute abnormality. ADRENAL GLANDS: No acute abnormality. KIDNEYS, URETERS AND BLADDER: No stones in the kidneys or ureters. No hydronephrosis. No perinephric or periureteral stranding. Urinary bladder is unremarkable. GI AND BOWEL: Stomach demonstrates no acute abnormality. There is no bowel obstruction. Appendix not visualized. No pericecal inflammation. PERITONEUM AND RETROPERITONEUM: No ascites. No free air. VASCULATURE: Aorta is normal in caliber. LYMPH NODES: No lymphadenopathy. REPRODUCTIVE ORGANS: No acute abnormality. BONES AND SOFT TISSUES: No acute osseous abnormality. No focal soft tissue abnormality. IMPRESSION: 1. Clustered nodular ground-glass densities in the left lung base, likely infectious/inflammatory bronchiolitis. 2. No acute findings in the abdomen or pelvis. Electronically signed by: Franky Crease MD 03/03/2024 11:44 PM EST RP Workstation: [id number]   CT ABDOMEN PELVIS W CONTRAST Result Date: 02/16/2024 EXAM: CT ABDOMEN AND PELVIS WITH CONTRAST 02/16/2024 10:03:14 PM TECHNIQUE: CT of the abdomen and pelvis was performed with the administration of 75 mL of iohexol  (OMNIPAQUE ) 350 MG/ML injection. Multiplanar reformatted images are provided for review. Automated exposure control, iterative reconstruction, and/or weight-based adjustment of the mA/kV was utilized to reduce the radiation dose to as low as reasonably achievable. COMPARISON: None available. CLINICAL HISTORY: Abdominal pain,  acute, nonlocalized. FINDINGS: LOWER CHEST: No acute abnormality. LIVER: The liver is unremarkable. GALLBLADDER AND BILE DUCTS: Gallbladder is unremarkable. No biliary ductal dilatation. SPLEEN: No acute abnormality. PANCREAS: No acute abnormality. ADRENAL GLANDS: No acute abnormality. KIDNEYS, URETERS AND BLADDER: No stones in the kidneys or ureters. No hydronephrosis. No perinephric or periureteral stranding. Urinary bladder is unremarkable. GI AND BOWEL: Stomach demonstrates no acute abnormality. There is no bowel obstruction. PERITONEUM AND RETROPERITONEUM: No ascites. No free air. VASCULATURE: Aorta is normal in caliber. LYMPH NODES: No lymphadenopathy. REPRODUCTIVE ORGANS: No acute abnormality. BONES AND SOFT TISSUES: No acute osseous abnormality. No focal soft tissue abnormality. IMPRESSION: 1. No acute findings in the abdomen or pelvis. Electronically signed by: Franky Crease MD 02/16/2024 10:20 PM EST RP Workstation: [id number]   DG Chest Portable 1 View Result Date: 02/16/2024 CLINICAL DATA:  Short of breath EXAM: PORTABLE CHEST 1 VIEW COMPARISON:  03/15/2021 FINDINGS: The heart size and mediastinal contours are within normal limits. Both lungs are clear. The visualized skeletal structures are unremarkable. IMPRESSION: No active disease. Electronically Signed   By: Luke Bun M.D.   On: 02/16/2024 21:12    Microbiology: Results for orders placed or performed during the hospital encounter of 03/03/24  MRSA Next Gen by PCR, Nasal  Status: None   Collection Time: 03/04/24  4:59 AM   Specimen: Nasal Mucosa; Nasal Swab  Result Value Ref Range Status   MRSA by PCR Next Gen NOT DETECTED NOT DETECTED Final    Comment: (NOTE) The GeneXpert MRSA Assay (FDA approved for NASAL specimens only), is one component of a comprehensive MRSA colonization surveillance program. It is not intended to diagnose MRSA infection nor to guide or monitor treatment for MRSA infections. Test performance is not FDA  approved in patients less than 74 years old. Performed at [facility], [address], [place], [place] [address]   Resp panel by RT-PCR (RSV, Flu A&B, Covid) Anterior Nasal Swab     Status: None   Collection Time: 03/04/24  5:30 AM   Specimen: Anterior Nasal Swab  Result Value Ref Range Status   SARS Coronavirus 2 by RT PCR NEGATIVE NEGATIVE Final    Comment: (NOTE) SARS-CoV-2 target nucleic acids are NOT DETECTED.  The SARS-CoV-2 RNA is generally detectable in upper respiratory specimens during the acute phase of infection. The lowest concentration of SARS-CoV-2 viral copies this assay can detect is 138 copies/mL. A negative result does not preclude SARS-Cov-2 infection and should not be used as the sole basis for treatment or other patient management decisions. A negative result may occur with  improper specimen collection/handling, submission of specimen other than nasopharyngeal swab, presence of viral mutation(s) within the areas targeted by this assay, and inadequate number of viral copies(<138 copies/mL). A negative result must be combined with clinical observations, patient history, and epidemiological information. The expected result is Negative.  Fact Sheet for Patients:  bloggercourse.com  Fact Sheet for Healthcare Providers:  seriousbroker.it  This test is no t yet approved or cleared by the [place]  FDA and  has been authorized for detection and/or diagnosis of SARS-CoV-2 by FDA under an Emergency Use Authorization (EUA). This EUA will remain  in effect (meaning this test can be used) for the duration of the COVID-19 declaration under Section 564(b)(1) of the Act, 21 U.S.C.section 360bbb-3(b)(1), unless the authorization is terminated  or revoked sooner.       Influenza A by PCR NEGATIVE NEGATIVE Final   Influenza B by PCR NEGATIVE NEGATIVE Final    Comment: (NOTE) The Xpert Xpress  SARS-CoV-2/FLU/RSV plus assay is intended as an aid in the diagnosis of influenza from Nasopharyngeal swab specimens and should not be used as a sole basis for treatment. Nasal washings and aspirates are unacceptable for Xpert Xpress SARS-CoV-2/FLU/RSV testing.  Fact Sheet for Patients: bloggercourse.com  Fact Sheet for Healthcare Providers: seriousbroker.it  This test is not yet approved or cleared by the [place]  FDA and has been authorized for detection and/or diagnosis of SARS-CoV-2 by FDA under an Emergency Use Authorization (EUA). This EUA will remain in effect (meaning this test can be used) for the duration of the COVID-19 declaration under Section 564(b)(1) of the Act, 21 U.S.C. section 360bbb-3(b)(1), unless the authorization is terminated or revoked.     Resp Syncytial Virus by PCR NEGATIVE NEGATIVE Final    Comment: (NOTE) Fact Sheet for Patients: bloggercourse.com  Fact Sheet for Healthcare Providers: seriousbroker.it  This test is not yet approved or cleared by the [place]  FDA and has been authorized for detection and/or diagnosis of SARS-CoV-2 by FDA under an Emergency Use Authorization (EUA). This EUA will remain in effect (meaning this test can be used) for the duration of the COVID-19 declaration under Section 564(b)(1) of the Act, 21  U.S.C. section 360bbb-3(b)(1), unless the authorization is terminated or revoked.  Performed at [facility], [address] [place], [place] [address]     Labs: CBC: Recent Labs  Lab 02/28/24 0516 03/03/24 2124 03/04/24 0657 03/05/24 0551  WBC 10.1 7.8 9.3 6.6  HGB 15.1 18.0* 15.2 13.3  HCT 42.8 48.6 42.2 38.6*  MCV 82.9 80.3 81.2 84.3  PLT 288 366 425* 420*   Basic Metabolic Panel: Recent Labs  Lab 03/04/24 0657 03/04/24 1025 03/04/24 1342 03/04/24 1755 03/05/24 0910  NA 134*  135 135 138 138  K 4.7 4.2 4.0 3.1* 3.3*  CL 99 100 100 101 100  CO2 20* 23 27 28 28   GLUCOSE 182* 202* 206* 139* 114*  BUN 14 13 12 10 7   CREATININE 0.93 0.97 0.91 0.91 0.82  CALCIUM  9.4 8.9 9.2 8.8* 8.8*   Liver Function Tests: Recent Labs  Lab 03/03/24 2218  AST 25  ALT 21  ALKPHOS 80  BILITOT 1.5*  PROT 7.7  ALBUMIN 4.1   CBG: Recent Labs  Lab 03/04/24 1708 03/04/24 1755 03/04/24 2156 03/05/24 0835 03/05/24 1136  GLUCAP 133* 124* 153* 110* 166*    Discharge time spent: greater than 30 minutes.  Signed: MDALA-GAUSI, Christphor Groft AGATHA, MD [facility] 03/05/2024 "

## 2024-03-05 NOTE — Inpatient Diabetes Management (Signed)
 Inpatient Diabetes Program Recommendations  AACE/ADA: New Consensus Statement on Inpatient Glycemic Control (2015)  Target Ranges:  Prepandial:   less than 140 mg/dL      Peak postprandial:   less than 180 mg/dL (1-2 hours)      Critically ill patients:  140 - 180 mg/dL   Lab Results  Component Value Date   GLUCAP 110 (H) 03/05/2024   HGBA1C 9.0 (H) 02/17/2024    Review of Glycemic Control  Latest Reference Range & Units 03/04/24 15:57 03/04/24 17:08 03/04/24 17:55 03/04/24 21:56 03/05/24 08:35  Glucose-Capillary 70 - 99 mg/dL 837 (H) 866 (H) 875 (H) 153 (H) 110 (H)  (H): Data is abnormally high Diabetes history: DM1 Outpatient Diabetes medications: Lantus  28 at bedtime, Novolog  15-20 TID 1:7 CHO Current orders for Inpatient glycemic control: Novolog  0-15 units TID, Semglee  28 units QHS   HgbA1C - 9% Covid 19+   Inpatient Diabetes Program Recommendations:     Spoke with patient regarding outpatient diabetes management. Reviewed that patient had covered meal night before and woke up with CBG of 290 mg/dL. Fearful to give insulin  due to recent lows and issues with overcorrection. Verified home medications.  Reviewed patient's current A1c of 9.0%. Explained what a A1c is and what it measures. Also reviewed goal A1c with patient, importance of good glucose control @ home, and blood sugar goals. Reviewed patho of DM, need for insulin , role of pancreas, DKA, sick day rules, need for correction when vomiting begins, CGM, plan for endocrinology, hyper vs hypo glycemia, vascular changes and commodities.  Provided patient with Dexcom. Reviewed application, sensor application and when to call MD. Patient has appointment with endocrinology next week. Reviewed questions to ask.  MD at bedside and discussed need for sliding scale at discharge. Patient is aware of how to administer and interpret. Already has insulin  and supplies at home.  No additional questions at this time.   Thanks, Tinnie Minus, MSN, RNC-OB Diabetes Coordinator [phone] (8a-5p)

## 2024-03-05 NOTE — Plan of Care (Signed)
